# Patient Record
Sex: Male | Born: 1950 | Hispanic: Yes | Marital: Married | Smoking: Former smoker
Health system: Southern US, Community
[De-identification: ages and names within clinical notes are randomized; demographics above are authoritative.]

## PROBLEM LIST (undated history)

## (undated) DIAGNOSIS — I10 Essential (primary) hypertension: Secondary | ICD-10-CM

## (undated) DIAGNOSIS — C801 Malignant (primary) neoplasm, unspecified: Secondary | ICD-10-CM

## (undated) DIAGNOSIS — R7303 Prediabetes: Secondary | ICD-10-CM

## (undated) DIAGNOSIS — C8590 Non-Hodgkin lymphoma, unspecified, unspecified site: Secondary | ICD-10-CM

## (undated) DIAGNOSIS — N289 Disorder of kidney and ureter, unspecified: Secondary | ICD-10-CM

## (undated) HISTORY — PX: HAMMER TOE SURGERY: SHX385

## (undated) HISTORY — PX: KNEE SURGERY: SHX244

---

## 2016-05-19 ENCOUNTER — Emergency Department (HOSPITAL_BASED_OUTPATIENT_CLINIC_OR_DEPARTMENT_OTHER): Payer: Medicare HMO

## 2016-05-19 ENCOUNTER — Emergency Department (HOSPITAL_BASED_OUTPATIENT_CLINIC_OR_DEPARTMENT_OTHER)
Admission: EM | Admit: 2016-05-19 | Discharge: 2016-05-19 | Disposition: A | Discharge status: Home / Self Care | Payer: Medicare HMO | Attending: Emergency Medicine | Admitting: Emergency Medicine

## 2016-05-19 ENCOUNTER — Encounter (HOSPITAL_BASED_OUTPATIENT_CLINIC_OR_DEPARTMENT_OTHER): Payer: Self-pay | Admitting: *Deleted

## 2016-05-19 DIAGNOSIS — N189 Chronic kidney disease, unspecified: Secondary | ICD-10-CM | POA: Insufficient documentation

## 2016-05-19 DIAGNOSIS — I129 Hypertensive chronic kidney disease with stage 1 through stage 4 chronic kidney disease, or unspecified chronic kidney disease: Secondary | ICD-10-CM | POA: Insufficient documentation

## 2016-05-19 DIAGNOSIS — Z79899 Other long term (current) drug therapy: Secondary | ICD-10-CM | POA: Diagnosis not present

## 2016-05-19 DIAGNOSIS — R1084 Generalized abdominal pain: Secondary | ICD-10-CM

## 2016-05-19 DIAGNOSIS — R112 Nausea with vomiting, unspecified: Secondary | ICD-10-CM

## 2016-05-19 HISTORY — DX: Essential (primary) hypertension: I10

## 2016-05-19 HISTORY — DX: Disorder of kidney and ureter, unspecified: N28.9

## 2016-05-19 LAB — COMPREHENSIVE METABOLIC PANEL
ALK PHOS: 53 U/L (ref 38–126)
ALT: 20 U/L (ref 17–63)
AST: 23 U/L (ref 15–41)
Albumin: 4.4 g/dL (ref 3.5–5.0)
Anion gap: 11 (ref 5–15)
BUN: 24 mg/dL — AB (ref 6–20)
CALCIUM: 9.3 mg/dL (ref 8.9–10.3)
CHLORIDE: 101 mmol/L (ref 101–111)
CO2: 26 mmol/L (ref 22–32)
CREATININE: 1.7 mg/dL — AB (ref 0.61–1.24)
GFR, EST AFRICAN AMERICAN: 47 mL/min — AB (ref >60)
GFR, EST NON AFRICAN AMERICAN: 40 mL/min — AB (ref >60)
Glucose, Bld: 162 mg/dL — ABNORMAL HIGH (ref 65–99)
Potassium: 3.8 mmol/L (ref 3.5–5.1)
Sodium: 138 mmol/L (ref 135–145)
Total Bilirubin: 0.5 mg/dL (ref 0.3–1.2)
Total Protein: 7.5 g/dL (ref 6.5–8.1)

## 2016-05-19 LAB — CBC WITH DIFFERENTIAL/PLATELET
BASOS ABS: 0 10*3/uL (ref 0.0–0.1)
Basophils Relative: 0 %
Eosinophils Absolute: 0 10*3/uL (ref 0.0–0.7)
Eosinophils Relative: 0 %
HEMATOCRIT: 43.5 % (ref 39.0–52.0)
HEMOGLOBIN: 14.6 g/dL (ref 13.0–17.0)
LYMPHS ABS: 0.7 10*3/uL (ref 0.7–4.0)
LYMPHS PCT: 7 %
MCH: 32.6 pg (ref 26.0–34.0)
MCHC: 33.6 g/dL (ref 30.0–36.0)
MCV: 97.1 fL (ref 78.0–100.0)
Monocytes Absolute: 0.4 10*3/uL (ref 0.1–1.0)
Monocytes Relative: 4 %
NEUTROS ABS: 9 10*3/uL — AB (ref 1.7–7.7)
NEUTROS PCT: 89 %
PLATELETS: 214 10*3/uL (ref 150–400)
RBC: 4.48 MIL/uL (ref 4.22–5.81)
RDW: 13.4 % (ref 11.5–15.5)
WBC: 10 10*3/uL (ref 4.0–10.5)

## 2016-05-19 LAB — URINALYSIS, ROUTINE W REFLEX MICROSCOPIC
BILIRUBIN URINE: NEGATIVE
GLUCOSE, UA: NEGATIVE mg/dL
HGB URINE DIPSTICK: NEGATIVE
Ketones, ur: NEGATIVE mg/dL
Leukocytes, UA: NEGATIVE
Nitrite: NEGATIVE
PROTEIN: NEGATIVE mg/dL
SPECIFIC GRAVITY, URINE: 1.025 (ref 1.005–1.030)
pH: 7 (ref 5.0–8.0)

## 2016-05-19 LAB — LIPASE, BLOOD: LIPASE: 22 U/L (ref 11–51)

## 2016-05-19 MED ORDER — MORPHINE SULFATE (PF) 4 MG/ML IV SOLN
4.0000 mg | Freq: Once | INTRAVENOUS | Status: AC
  Administered 2016-05-19: 4 mg via INTRAVENOUS
  Filled 2016-05-19: qty 1

## 2016-05-19 MED ORDER — ONDANSETRON HCL 4 MG/2ML IJ SOLN
4.0000 mg | Freq: Once | INTRAMUSCULAR | Status: AC
  Administered 2016-05-19: 4 mg via INTRAVENOUS
  Filled 2016-05-19: qty 2

## 2016-05-19 MED ORDER — HYDROMORPHONE HCL 1 MG/ML IJ SOLN
0.5000 mg | Freq: Once | INTRAMUSCULAR | Status: AC
  Administered 2016-05-19: 0.5 mg via INTRAVENOUS
  Filled 2016-05-19: qty 1

## 2016-05-19 MED ORDER — SODIUM CHLORIDE 0.9 % IV BOLUS (SEPSIS)
1000.0000 mL | Freq: Once | INTRAVENOUS | Status: AC
  Administered 2016-05-19: 1000 mL via INTRAVENOUS

## 2016-05-19 MED ORDER — ONDANSETRON 4 MG PO TBDP: 4.0000 mg | ORAL_TABLET | Freq: Three times a day (TID) | ORAL | 0 refills | Status: DC | PRN

## 2016-05-19 MED ORDER — SODIUM CHLORIDE 0.9 % IV BOLUS (SEPSIS): 1000.0000 mL | Freq: Once | INTRAVENOUS | Status: DC

## 2016-05-19 MED ORDER — DICYCLOMINE HCL 20 MG PO TABS: 20.0000 mg | ORAL_TABLET | Freq: Three times a day (TID) | ORAL | 0 refills | Status: DC

## 2016-05-19 MED ORDER — SODIUM CHLORIDE 0.9 % IV SOLN: INTRAVENOUS | Status: DC

## 2016-05-19 MED ORDER — IOPAMIDOL (ISOVUE-300) INJECTION 61%
100.0000 mL | Freq: Once | INTRAVENOUS | Status: AC | PRN
  Administered 2016-05-19: 100 mL via INTRAVENOUS

## 2016-05-19 NOTE — ED Notes (Signed)
Tolerated po fluids with not further nausea/vomiting

## 2016-05-19 NOTE — ED Notes (Signed)
MD at bedside. 

## 2016-05-19 NOTE — ED Notes (Signed)
Pt ate dinner and then began having upper abd pain with n/v. Vomited x 10 PTA.

## 2016-05-19 NOTE — ED Notes (Signed)
Pt states little change after morphine given. Rates pain 8-10. MD aware and orders received.

## 2016-05-19 NOTE — ED Provider Notes (Addendum)
TIME SEEN: 2:25 AM  CHIEF COMPLAINT: Abdominal pain, nausea, vomiting  HPI: Pt is a 65 y.o. male with history of chronic kidney disease, no abdominal surgical history who presents emergency department with diffuse cramping, sharp abdominal pain that he describes as severe that started tonight without radiation and nausea and vomiting. Vomiting has been nonbloody, nonbilious and he has had over 10 episodes. No diarrhea, bloody stools or melena. Having normal bowel movements. No fevers or chills. No recent international travel. States his family has been eating similar foods to him and no one else is sick. His nephew did have diarrhea several days ago but no vomiting. Denies dysuria or hematuria. No radiation of pain. No aggravating or alleviating factors. No chest pain or shortness of breath. States he ate [place], peas and rice for dinner. Has never been told he has problems with his gallbladder. No family history of gallbladder disease. No alcohol use.  ROS: See HPI Constitutional: no fever  Eyes: no drainage  ENT: no runny nose   Cardiovascular:  no chest pain  Resp: no SOB  GI:  vomiting GU: no dysuria Integumentary: no rash  Allergy: no hives  Musculoskeletal: no leg swelling  Neurological: no slurred speech ROS otherwise negative  PAST MEDICAL HISTORY/PAST SURGICAL HISTORY:  No past medical history on file.  MEDICATIONS:  Prior to Admission medications   Not on File    ALLERGIES:  Allergies not on file  SOCIAL HISTORY:  Social History  Substance Use Topics  . Smoking status: Not on file  . Smokeless tobacco: Not on file  . Alcohol use Not on file    FAMILY HISTORY: No family history on file.  EXAM: BP 139/83   Pulse 60   Temp 97.8 F (36.6 C)   Resp 18   Ht 5' 11.5" (1.816 m)   Wt 278 lb (126.1 kg)   SpO2 100%   BMI 38.23 kg/m  CONSTITUTIONAL: Alert and oriented and responds appropriately to questions. Appears uncomfortable, afebrile, nontoxic appearing HEAD:  Normocephalic EYES: Conjunctivae clear, PERRL, EOMI ENT: normal nose; no rhinorrhea; dry mucous membranes NECK: Supple, no meningismus, no nuchal rigidity, no LAD  CARD: RRR; S1 and S2 appreciated; no murmurs, no clicks, no rubs, no gallops RESP: Normal chest excursion without splinting or tachypnea; breath sounds clear and equal bilaterally; no wheezes, no rhonchi, no rales, no hypoxia or respiratory distress, speaking full sentences ABD/GI: Normal bowel sounds; non-distended; soft, diffusely tender throughout the mid and upper abdomen, no rebound, no guarding, no peritoneal signs, no hepatosplenomegaly, no tenderness at McBurney's point, negative Murphy sign BACK:  The back appears normal and is non-tender to palpation, there is no CVA tenderness EXT: Normal ROM in all joints; non-tender to palpation; no edema; normal capillary refill; no cyanosis, no calf tenderness or swelling    SKIN: Normal color for age and race; warm; no rash NEURO: Moves all extremities equally, sensation to light touch intact diffusely, cranial nerves II through XII intact, normal speech PSYCH: The patient's mood and manner are appropriate. Grooming and personal hygiene are appropriate.  MEDICAL DECISION MAKING: Patient here with abdominal pain, nausea vomiting. Differential diagnosis includes viral illness, gastritis, pancreatitis, colitis, less likely cholecystitis or choledocholithiasis. Doubt appendicitis. We'll obtain labs, urine and a CT of his abdomen and pelvis. We'll treat his symptoms with IV fluids, morphine, Zofran and reassess.  ED PROGRESS: 4:30 AM  Pt reports feeling much better. Able to drink without difficulty or further vomiting. Labs have been unremarkable. No leukocytosis.  Does have a creatinine of 1.7 which he reports is baseline. LFTs, lipase normal. Urine shows no blood or sign of infection. CT scan shows no acute abnormality. Normal-appearing intestines, gallbladder, pancreas, appendix. Suspect viral  illness. Will discharge home with prescriptions for Bentyl, Zofran. He does have a PCP for outpatient follow-up. Discussed return precautions. He verbalizes understanding and is comfortable with this plan.   At this time, I do not feel there is any life-threatening condition present. I have reviewed and discussed all results (EKG, imaging, lab, urine as appropriate) and exam findings with patient/family. I have reviewed nursing notes and appropriate previous records.  I feel the patient is safe to be discharged home without further emergent workup and can continue workup as an outpatient as needed. Discussed usual and customary return precautions. Patient/family verbalize understanding and are comfortable with this plan.  Outpatient follow-up has been provided. All questions have been answered.      [place], DO 05/19/16 [place], DO 05/19/16 [phone]

## 2016-05-19 NOTE — ED Notes (Signed)
Radiology in to give pt contrast and instructions.

## 2016-05-19 NOTE — ED Triage Notes (Addendum)
Pt c/o upper abd pain that started after eating this evening. Pt c/o sharp abd pain that is constant. States he has vomited approx 10 times denies any fevers. Family states they ate the same thing as pt and are ok.

## 2016-05-19 NOTE — ED Notes (Signed)
To CT

## 2016-05-19 NOTE — ED Notes (Signed)
Returned from CT.

## 2018-01-09 ENCOUNTER — Other Ambulatory Visit (HOSPITAL_COMMUNITY): Payer: Self-pay | Admitting: Otolaryngology

## 2018-01-09 DIAGNOSIS — R221 Localized swelling, mass and lump, neck: Secondary | ICD-10-CM

## 2018-01-15 ENCOUNTER — Encounter (HOSPITAL_COMMUNITY): Payer: Self-pay

## 2018-01-15 ENCOUNTER — Ambulatory Visit (HOSPITAL_COMMUNITY)
Admission: RE | Admit: 2018-01-15 | Discharge: 2018-01-15 | Disposition: A | Discharge status: Home / Self Care | Payer: Medicare HMO | Source: Ambulatory Visit | Attending: Otolaryngology | Admitting: Otolaryngology

## 2018-01-15 ENCOUNTER — Other Ambulatory Visit (HOSPITAL_COMMUNITY): Payer: Self-pay | Admitting: Otolaryngology

## 2018-01-15 DIAGNOSIS — R221 Localized swelling, mass and lump, neck: Secondary | ICD-10-CM | POA: Diagnosis present

## 2018-01-15 LAB — POCT I-STAT CREATININE: Creatinine, Ser: 1.5 mg/dL — ABNORMAL HIGH (ref 0.61–1.24)

## 2018-01-15 MED ORDER — IOHEXOL 300 MG/ML  SOLN
75.0000 mL | Freq: Once | INTRAMUSCULAR | Status: AC | PRN
  Administered 2018-01-15: 75 mL via INTRAVENOUS

## 2018-01-19 ENCOUNTER — Other Ambulatory Visit (HOSPITAL_COMMUNITY): Payer: Self-pay | Admitting: Otolaryngology

## 2018-01-19 DIAGNOSIS — R220 Localized swelling, mass and lump, head: Secondary | ICD-10-CM

## 2018-01-19 DIAGNOSIS — R221 Localized swelling, mass and lump, neck: Secondary | ICD-10-CM

## 2018-01-26 ENCOUNTER — Other Ambulatory Visit: Payer: Self-pay | Admitting: Radiology

## 2018-01-27 ENCOUNTER — Other Ambulatory Visit: Payer: Self-pay | Admitting: Radiology

## 2018-01-28 ENCOUNTER — Encounter (HOSPITAL_COMMUNITY): Payer: Self-pay

## 2018-01-28 ENCOUNTER — Ambulatory Visit (HOSPITAL_COMMUNITY)
Admission: RE | Admit: 2018-01-28 | Discharge: 2018-01-28 | Disposition: A | Discharge status: Home / Self Care | Payer: Medicare HMO | Source: Ambulatory Visit | Attending: Otolaryngology | Admitting: Otolaryngology

## 2018-01-28 DIAGNOSIS — I10 Essential (primary) hypertension: Secondary | ICD-10-CM | POA: Insufficient documentation

## 2018-01-28 DIAGNOSIS — Z79899 Other long term (current) drug therapy: Secondary | ICD-10-CM | POA: Diagnosis not present

## 2018-01-28 DIAGNOSIS — Z87891 Personal history of nicotine dependence: Secondary | ICD-10-CM | POA: Diagnosis not present

## 2018-01-28 DIAGNOSIS — R221 Localized swelling, mass and lump, neck: Secondary | ICD-10-CM

## 2018-01-28 DIAGNOSIS — D490 Neoplasm of unspecified behavior of digestive system: Secondary | ICD-10-CM | POA: Diagnosis not present

## 2018-01-28 DIAGNOSIS — R220 Localized swelling, mass and lump, head: Secondary | ICD-10-CM

## 2018-01-28 LAB — CBC
HCT: 46.7 % (ref 39.0–52.0)
HEMOGLOBIN: 15.6 g/dL (ref 13.0–17.0)
MCH: 32.7 pg (ref 26.0–34.0)
MCHC: 33.4 g/dL (ref 30.0–36.0)
MCV: 97.9 fL (ref 78.0–100.0)
Platelets: 190 10*3/uL (ref 150–400)
RBC: 4.77 MIL/uL (ref 4.22–5.81)
RDW: 12.9 % (ref 11.5–15.5)
WBC: 6.6 10*3/uL (ref 4.0–10.5)

## 2018-01-28 LAB — PROTIME-INR
INR: 1.05
Prothrombin Time: 13.6 seconds (ref 11.4–15.2)

## 2018-01-28 MED ORDER — MIDAZOLAM HCL 2 MG/2ML IJ SOLN
INTRAMUSCULAR | Status: AC
  Filled 2018-01-28: qty 2

## 2018-01-28 MED ORDER — HYDROCODONE-ACETAMINOPHEN 5-325 MG PO TABS: 1.0000 | ORAL_TABLET | ORAL | Status: DC | PRN

## 2018-01-28 MED ORDER — LIDOCAINE HCL (PF) 1 % IJ SOLN
INTRAMUSCULAR | Status: AC
  Filled 2018-01-28: qty 30

## 2018-01-28 MED ORDER — MIDAZOLAM HCL 2 MG/2ML IJ SOLN
INTRAMUSCULAR | Status: AC | PRN
  Administered 2018-01-28: 1 mg via INTRAVENOUS

## 2018-01-28 MED ORDER — FENTANYL CITRATE (PF) 100 MCG/2ML IJ SOLN
INTRAMUSCULAR | Status: AC
  Filled 2018-01-28: qty 2

## 2018-01-28 MED ORDER — FENTANYL CITRATE (PF) 100 MCG/2ML IJ SOLN
INTRAMUSCULAR | Status: AC | PRN
  Administered 2018-01-28: 25 ug via INTRAVENOUS

## 2018-01-28 MED ORDER — SODIUM CHLORIDE 0.9 % IV SOLN: INTRAVENOUS | Status: DC

## 2018-01-28 NOTE — H&P (Signed)
Chief Complaint: Patient was seen in consultation today for left submandibular node/mass biopsy at the request of Crossley,James J  Referring Physician(s): Crossley,James J  Supervising Physician: Markus Daft  Patient Status: [facility] - Out-pt  History of Present Illness: Jeffery Wilson is a 67 y.o. male   Pt has noticed this"knot" that "comes and goes" for 2 yrs Never completely goes away Denies pain Quit smoking 5 yrs ago Denies chewing tobacco PCP imaged for first time 01/15/18 CT IMPRESSION: 1. 3.3 x 4.1 x 5 cm solid LEFT submandibular gland mass. Differential diagnosis includes benign mixed tumor or carcinoma. 2. Prominent though not pathologically enlarged LEFT lymph nodes potentially metastatic if tumor is malignant. 3. For above findings, recommend ENT consultation and histopathologic correlation.  Now scheduled for biopsy  Past Medical History:  Diagnosis Date  . Hypertension   . Renal disorder    pt says stage 3 kidney dz     Past Surgical History:  Procedure Laterality Date  . HAMMER TOE SURGERY    . KNEE SURGERY      Allergies: Patient has no known allergies.  Medications: Prior to Admission medications   Medication Sig Start Date End Date Taking? Authorizing Provider  acetaminophen (TYLENOL) 500 MG tablet Take 1,000 mg by mouth every 6 (six) hours as needed for moderate pain or headache.   Yes [provider]  Artificial Tear Solution (GENTEAL TEARS OP) Place 1 drop into both eyes at bedtime.   Yes [provider]  [employer] Extract [phone] PSORIASIS MEDICATED EX) Apply 1 application topically daily.   Yes [provider]  furosemide (LASIX) 40 MG tablet Take 40 mg by mouth daily.    Yes [provider]  metoprolol succinate (TOPROL-XL) 50 MG 24 hr tablet Take 50 mg by mouth daily. Take with or immediately following a meal.   Yes [provider]  Multiple Vitamins-Minerals (MULTIVITAMIN PO) Take 1 tablet by  mouth daily.   Yes [provider]  Omega-3 Fatty Acids (OMEGA 3 PO) Take 1 capsule by mouth 2 (two) times daily.   Yes [provider]  pravastatin (PRAVACHOL) 20 MG tablet Take 20 mg by mouth at bedtime.    Yes [provider]  Turmeric 500 MG CAPS Take 500 mg by mouth daily.   Yes [provider]  dicyclomine (BENTYL) 20 MG tablet Take 1 tablet (20 mg total) by mouth 3 (three) times daily before meals. As needed for abdominal cramping Patient not taking: Reported on 01/20/2018 05/19/16   Ward, Delice Bison, DO  ondansetron (ZOFRAN ODT) 4 MG disintegrating tablet Take 1 tablet (4 mg total) by mouth every 8 (eight) hours as needed for nausea or vomiting. Patient not taking: Reported on 01/20/2018 05/19/16   Ward, Delice Bison, DO     History reviewed. No pertinent family history.  Social History   Socioeconomic History  . Marital status: Married    Spouse name: Not on file  . Number of children: Not on file  . Years of education: Not on file  . Highest education level: Not on file  Occupational History  . Not on file  Social Needs  . Financial resource strain: Not on file  . Food insecurity:    Worry: Not on file    Inability: Not on file  . Transportation needs:    Medical: Not on file    Non-medical: Not on file  Tobacco Use  . Smoking status: Never Smoker  . Smokeless tobacco: Never  Used  Substance and Sexual Activity  . Alcohol use: Yes    Comment: daily   . Drug use: No  . Sexual activity: Not on file  Lifestyle  . Physical activity:    Days per week: Not on file    Minutes per session: Not on file  . Stress: Not on file  Relationships  . Social connections:    Talks on phone: Not on file    Gets together: Not on file    Attends religious service: Not on file    Active member of club or organization: Not on file    Attends meetings of clubs or organizations: Not on file    Relationship status: Not on file  Other Topics Concern  .  Not on file  Social History Narrative  . Not on file    Review of Systems: A 12 point ROS discussed and pertinent positives are indicated in the HPI above.  All other systems are negative.  Review of Systems  Constitutional: Negative for activity change, fatigue and fever.  HENT: Negative for sore throat and trouble swallowing.   Respiratory: Negative for cough.   Cardiovascular: Negative for chest pain.  Gastrointestinal: Negative for abdominal pain.  Musculoskeletal: Negative for back pain.  Neurological: Negative for weakness.  Psychiatric/Behavioral: Negative for behavioral problems and confusion.    Vital Signs: BP (!) 155/87   Pulse (!) 54   Temp 97.7 F (36.5 C)   Ht 5\' 10"  (1.778 m)   Wt 268 lb 6.4 oz (121.7 kg)   SpO2 100%   BMI 38.51 kg/m   Physical Exam  Constitutional: He is oriented to person, place, and time.  Neck:  Palpable 3 cm NT node  Left submandibular area Hard to touch; no fluctuance   Cardiovascular: Normal rate, regular rhythm and normal heart sounds.  Pulmonary/Chest: Effort normal and breath sounds normal.  Abdominal: Soft. Bowel sounds are normal.  Musculoskeletal: Normal range of motion.  Neurological: He is alert and oriented to person, place, and time.  Skin: Skin is warm and dry.  Psychiatric: He has a normal mood and affect. His behavior is normal. Judgment and thought content normal.  Nursing note and vitals reviewed.   Imaging: Ct Soft Tissue Neck W Contrast  Result Date: 01/15/2018 CLINICAL DATA:  Waxing and waning LEFT submandibular mass for 2 years. EXAM: CT NECK WITH CONTRAST TECHNIQUE: Multidetector CT imaging of the neck was performed using the standard protocol following the bolus administration of intravenous contrast. CONTRAST:  31mL OMNIPAQUE IOHEXOL 300 MG/ML  SOLN COMPARISON:  None. FINDINGS: PHARYNX AND LARYNX: Normal.  Widely patent airway. SALIVARY GLANDS: 3.3 x 4.1 x 5 cm homogeneous solid mass contiguous with and  appearing to arise from the lateral aspect LEFT submandibular gland. No sialolith or inflammatory changes. Remaining major salivary glands normal. THYROID: Normal. LYMPH NODES: Prominent though not pathologically enlarged reniform LEFT Level 3 and 4 lymph nodes. VASCULAR: Mild calcific atherosclerosis LEFT carotid bifurcation. Focally enlarged LEFT supraclavicular venous structure. LIMITED INTRACRANIAL: Normal. VISUALIZED ORBITS: Normal. MASTOIDS AND VISUALIZED PARANASAL SINUSES: Small LEFT maxillary sinus mucosal retention cysts without air-fluid levels. RIGHT posterior ethmoid air-fluid level. Mastoid air cells are well aerated. SKELETON: LEFT submandibular mass is indistinguishable from the LEFT inferior masseter muscle. RIGHT tooth 20 RIGHT maxillary molar periapical abscess. Bulky ventral endplate spurring Z0-0. UPPER CHEST: Lung apices are clear. No superior mediastinal lymphadenopathy. OTHER: None. IMPRESSION: 1. 3.3 x 4.1 x 5 cm solid LEFT submandibular gland mass. Differential diagnosis includes  benign mixed tumor or carcinoma. 2. Prominent though not pathologically enlarged LEFT lymph nodes potentially metastatic if tumor is malignant. 3. For above findings, recommend ENT consultation and histopathologic correlation. Electronically Signed   By: Elon Alas M.D.   On: 01/15/2018 21:52    Labs:  CBC: No results for input(s): WBC, HGB, HCT, PLT in the last 8760 hours.  COAGS: No results for input(s): INR, APTT in the last 8760 hours.  BMP: Recent Labs    01/15/18 1708  CREATININE 1.50*    LIVER FUNCTION TESTS: No results for input(s): BILITOT, AST, ALT, ALKPHOS, PROT, ALBUMIN in the last 8760 hours.  TUMOR MARKERS: No results for input(s): AFPTM, CEA, CA199, CHROMGRNA in the last 8760 hours.  Assessment and Plan:  Left submandibular node Enlarges then subsides x 2 yrs CT reveals mass Now scheduled for biopsy of same Risks and benefits discussed with the patient including,  but not limited to bleeding, infection, damage to adjacent structures or low yield requiring additional tests.  All of the patient's questions were answered, patient is agreeable to proceed. Consent signed and in chart.   Thank you for this interesting consult.  I greatly enjoyed meeting Jeffery Wilson and look forward to participating in their care.  A copy of this report was sent to the requesting provider on this date.  Electronically Signed: Lavonia Drafts, PA-C 01/28/2018, 11:54 AM   I spent a total of  30 Minutes   in face to face in clinical consultation, greater than 50% of which was counseling/coordinating care for left submandibular LN bx

## 2018-01-28 NOTE — Sedation Documentation (Signed)
Pt is in radiology nurses station at this time. Accompanied by wife and daughters. Pt has not complaints of pain or discomfort. Awaiting MD.

## 2018-01-28 NOTE — Discharge Instructions (Signed)

## 2018-01-28 NOTE — Procedures (Signed)
US guided core biopsies from left submandibular mass.  5 cores obtained.  Minimal blood loss and no immediate complication.

## 2018-03-17 ENCOUNTER — Encounter: Payer: Self-pay | Admitting: Hematology

## 2018-03-17 ENCOUNTER — Telehealth: Payer: Self-pay | Admitting: Hematology

## 2018-03-17 NOTE — Telephone Encounter (Signed)
New referral received from Dr. Reine Just office from [facility] for Jeffery Wilson. Received a call from [place] from Dr. Waynard Edwards office to schedule an appt. Pt has been scheduled to see Dr. [name] on 10/1 at 11am. Ander Purpura will notify the pt. Letter mailed.

## 2018-03-23 NOTE — Progress Notes (Signed)
HEMATOLOGY/ONCOLOGY CONSULTATION NOTE  Date of Service: 03/24/2018  Patient Care Team: Helane Rima, MD as PCP - General (Family Medicine)  CHIEF COMPLAINTS/PURPOSE OF CONSULTATION:  Concern for a Lymphoproliferative process  HISTORY OF PRESENTING ILLNESS:   Jeffery Wilson is a wonderful 67 y.o. male who has been referred to [place] by Dr. Helane Rima for evaluation and management of his concern for a Lymphoproliferative process. He is accompanied today by his daughter and wife. The pt reports that he is doing well overall.   The pt reports that he first noticed the left jaw mass two years ago which has slowly grown. 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 The pt notes that his PCP Dr. Lavone Neri noticed the mass in July/August at a wellness visit and he was subsequently referred to ENT Dr. Fenton Malling at [facility]. The pt denies pain, discomfort, fevers, chills, night sweats, unexpected weight loss, and any other lumps or bumps. He notes that he is not feeling any differently recently as compared to the last 6 months to a year.   The pt also notes that his right lower leg is also larger than his left, and has been this way all of his life. He notes that his ankle swelling is stable and has varicose veins.   The pt notes hammer toe surgery and arthroscopic left knee surgery. He notes that he was previously diagnosed with CKD but has managed this well and notes his creatinine has nearly normalized. He also notes that has hypertension.   The pt notes that he had Prevnar and Pneumovax last year and regularly receives his annual flu vaccine.   Of note prior to the patient's visit today, pt has had a Submandibular gland biopsy completed on 01/28/18 with results revealing concern for a lymphoproliferative process.   The pt also had a 01/15/18 CT Neck which revealed 3.3  x 4.1 x 5 cm solid LEFT submandibular gland mass. Differential diagnosis includes benign mixed tumor or carcinoma. 2. Prominent though not pathologically enlarged LEFT lymph nodes potentially metastatic if tumor is malignant. 3. For above findings, recommend ENT consultation and histopathologic correlation.  Most recent lab results (01/28/18) of CBC is as follows: all values are WNL.  On review of systems, pt reports left jaw mass, stable energy levels, moving his bowels well, stable ankle swelling, and denies fevers, chills, night sweats, painful jaw mass, discomfort at jaw mass, unexpected weight loss, problems swallowing, pain along the spine, itching, back pain, new fatigue, abdominal pains, problems passing urine, and any other symptoms.   On PMHx the pt reports CKD, HTN, left knee arthroscopic knee surgery in 2016. 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 The pt smokes one cigarette every morning. He drinks 2-3 drinks of whiskey most afternoons, 4-6 on the weekends.  On Family Hx the pt reports father with MI at age 79. Denies blood disorders or cancer.    MEDICAL HISTORY:  Past Medical History:  Diagnosis Date  . Hypertension   . Renal disorder    pt says stage 3 kidney dz     SURGICAL HISTORY: Past Surgical History:  Procedure Laterality Date  . HAMMER TOE SURGERY    . KNEE SURGERY      SOCIAL HISTORY: Social History   Socioeconomic History  . Marital  status: Married    Spouse name: Not on file  . Number of children: Not on file  . Years of education: Not on file  . Highest education level: Not on file  Occupational History  . Not on file  Social Needs  . Financial resource strain: Not on file  . Food insecurity:    Worry: Not on file    Inability: Not on file  . Transportation needs:    Medical: Not on file    Non-medical: Not on file  Tobacco Use  . Smoking status: Never Smoker  . Smokeless tobacco: Never Used  Substance  and Sexual Activity  . Alcohol use: Yes    Comment: daily   . Drug use: No  . Sexual activity: Not on file  Lifestyle  . Physical activity:    Days per week: Not on file    Minutes per session: Not on file  . Stress: Not on file  Relationships  . Social connections:    Talks on phone: Not on file    Gets together: Not on file    Attends religious service: Not on file    Active member of club or organization: Not on file    Attends meetings of clubs or organizations: Not on file    Relationship status: Not on file  . Intimate partner violence:    Fear of current or ex partner: Not on file    Emotionally abused: Not on file    Physically abused: Not on file    Forced sexual activity: Not on file  Other Topics Concern  . Not on file  Social History Narrative  . Not on file    FAMILY HISTORY: History reviewed. No pertinent family history.  ALLERGIES:  has No Known Allergies.  MEDICATIONS:  Current Outpatient Medications  Medication Sig Dispense Refill  . acetaminophen (TYLENOL) 500 MG tablet Take 1,000 mg by mouth every 6 (six) hours as needed for moderate pain or headache.    . Artificial Tear Solution (GENTEAL TEARS OP) Place 1 drop into both eyes at bedtime.    Beryl Meager Tar Extract [phone] PSORIASIS MEDICATED EX) Apply 1 application topically daily.    Marland Kitchen dicyclomine (BENTYL) 20 MG tablet Take 1 tablet (20 mg total) by mouth 3 (three) times daily before meals. As needed for abdominal cramping (Patient not taking: Reported on 01/20/2018) 20 tablet 0  . furosemide (LASIX) 40 MG tablet Take 40 mg by mouth daily.     . metoprolol succinate (TOPROL-XL) 50 MG 24 hr tablet Take 50 mg by mouth daily. Take with or immediately following a meal.    . Multiple Vitamins-Minerals (MULTIVITAMIN PO) Take 1 tablet by mouth daily.    . Omega-3 Fatty Acids (OMEGA 3 PO) Take 1 capsule by mouth 2 (two) times daily.    . ondansetron (ZOFRAN ODT) 4 MG disintegrating tablet Take 1 tablet (4 mg total)  by mouth every 8 (eight) hours as needed for nausea or vomiting. (Patient not taking: Reported on 01/20/2018) 20 tablet 0  . pravastatin (PRAVACHOL) 20 MG tablet Take 20 mg by mouth at bedtime.     . Turmeric 500 MG CAPS Take 500 mg by mouth daily.     No current facility-administered medications for this visit.     REVIEW OF SYSTEMS:    10 Point review of Systems was done is negative except as noted above.  PHYSICAL EXAMINATION: ECOG PERFORMANCE STATUS: 1 - Symptomatic but completely ambulatory  . Vitals:  03/24/18 1130  BP: (!) 148/81  Pulse: (!) 55  Resp: 18  Temp: 97.9 F (36.6 C)  SpO2: 100%   Filed Weights   03/24/18 1130  Weight: 272 lb 3.2 oz (123.5 kg)   .Body mass index is 39.06 kg/m.  GENERAL:alert, in no acute distress and comfortable SKIN: no acute rashes, no significant lesions, some venous stasis skin changes on lower right extremity  EYES: conjunctiva are pink and non-injected, sclera anicteric OROPHARYNX: MMM, no exudates, no oropharyngeal erythema or ulceration NECK: supple, no JVD, palpable movable mass at left submandibular gland  LYMPH:  no palpable lymphadenopathy in the cervical, axillary or inguinal regions LUNGS: clear to auscultation b/l with normal respiratory effort HEART: regular rate & rhythm ABDOMEN:  normoactive bowel sounds , non tender, not distended. Extremity: 1+ pedal edema, venous stasis changes in right lower extremity  PSYCH: alert & oriented x 3 with fluent speech NEURO: no focal motor/sensory deficits  LABORATORY DATA:  I have reviewed the data as listed  . CBC Latest Ref Rng & Units 03/24/2018 01/28/2018 05/19/2016  WBC 4.0 - 10.3 K/uL 6.1 6.6 10.0  Hemoglobin 13.0 - 17.1 g/dL 15.1 15.6 14.6  Hematocrit 38.4 - 49.9 % 44.7 46.7 43.5  Platelets 140 - 400 K/uL 203 190 214    . CMP Latest Ref Rng & Units 03/24/2018 01/15/2018 05/19/2016  Glucose 70 - 99 mg/dL 99 - 162(H)  BUN 8 - 23 mg/dL 16 - 24(H)  Creatinine 0.61 - 1.24  mg/dL 1.43(H) 1.50(H) 1.70(H)  Sodium 135 - 145 mmol/L 141 - 138  Potassium 3.5 - 5.1 mmol/L 4.0 - 3.8  Chloride 98 - 111 mmol/L 103 - 101  CO2 22 - 32 mmol/L 29 - 26  Calcium 8.9 - 10.3 mg/dL 10.0 - 9.3  Total Protein 6.5 - 8.1 g/dL 7.9 - 7.5  Total Bilirubin 0.3 - 1.2 mg/dL 0.7 - 0.5  Alkaline Phos 38 - 126 U/L 55 - 53  AST 15 - 41 U/L 24 - 23  ALT 0 - 44 U/L 20 - 20   Component     Latest Ref Rng & Units 03/24/2018  Hepatitis B Surface Ag     Negative Negative  Hep B Core Ab, Tot     Negative Negative  HIV Screen 4th Generation wRfx     Non Reactive Non Reactive  HCV Ab     0.0 - 0.9 s/co ratio <0.1  LDH     98 - 192 U/L 139   . Lab Results  Component Value Date   LDH 139 03/24/2018    01/28/18 Submandibular gland biopsy:    RADIOGRAPHIC STUDIES: I have personally reviewed the radiological images as listed and agreed with the findings in the report. No results found.  ASSESSMENT & PLAN:   67 y.o. male with  1. 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  -Discussed the need for a larger tissue sample for more accurate diagnosis and that I will refer the pt to ENT Dr. 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 The patient knows to call the clinic with any problems, questions or concerns.  The total time spent in the appt was 45 minutes and more than 50% was on counseling and direct patient cares.     Sullivan Lone MD MS AAHIVMS [facility] Hematology/Oncology Physician [facility]  (Office):       [phone] (Work cell):  [phone] (Fax):           [phone]  03/24/2018 12:25 PM  I, Baldwin [place], am acting as a scribe for Dr. [name]  .I have reviewed the above documentation for accuracy and completeness, and I agree with the above. Brunetta Genera MD

## 2018-03-24 ENCOUNTER — Inpatient Hospital Stay: Payer: Medicare HMO | Attending: Hematology | Admitting: Hematology

## 2018-03-24 ENCOUNTER — Inpatient Hospital Stay: Payer: Medicare HMO

## 2018-03-24 ENCOUNTER — Encounter: Payer: Self-pay | Admitting: Hematology

## 2018-03-24 VITALS — BP 148/81 | HR 55 | Temp 97.9°F | Resp 18 | Ht 70.0 in | Wt 272.2 lb

## 2018-03-24 DIAGNOSIS — I872 Venous insufficiency (chronic) (peripheral): Secondary | ICD-10-CM

## 2018-03-24 DIAGNOSIS — N189 Chronic kidney disease, unspecified: Secondary | ICD-10-CM | POA: Insufficient documentation

## 2018-03-24 DIAGNOSIS — R221 Localized swelling, mass and lump, neck: Secondary | ICD-10-CM | POA: Insufficient documentation

## 2018-03-24 DIAGNOSIS — K8020 Calculus of gallbladder without cholecystitis without obstruction: Secondary | ICD-10-CM | POA: Insufficient documentation

## 2018-03-24 DIAGNOSIS — I8391 Asymptomatic varicose veins of right lower extremity: Secondary | ICD-10-CM | POA: Insufficient documentation

## 2018-03-24 DIAGNOSIS — K760 Fatty (change of) liver, not elsewhere classified: Secondary | ICD-10-CM | POA: Insufficient documentation

## 2018-03-24 DIAGNOSIS — C8511 Unspecified B-cell lymphoma, lymph nodes of head, face, and neck: Secondary | ICD-10-CM | POA: Diagnosis not present

## 2018-03-24 DIAGNOSIS — M7989 Other specified soft tissue disorders: Secondary | ICD-10-CM | POA: Diagnosis not present

## 2018-03-24 DIAGNOSIS — Z79899 Other long term (current) drug therapy: Secondary | ICD-10-CM | POA: Insufficient documentation

## 2018-03-24 DIAGNOSIS — F17210 Nicotine dependence, cigarettes, uncomplicated: Secondary | ICD-10-CM | POA: Diagnosis not present

## 2018-03-24 DIAGNOSIS — M129 Arthropathy, unspecified: Secondary | ICD-10-CM

## 2018-03-24 DIAGNOSIS — K5730 Diverticulosis of large intestine without perforation or abscess without bleeding: Secondary | ICD-10-CM | POA: Insufficient documentation

## 2018-03-24 DIAGNOSIS — I129 Hypertensive chronic kidney disease with stage 1 through stage 4 chronic kidney disease, or unspecified chronic kidney disease: Secondary | ICD-10-CM | POA: Insufficient documentation

## 2018-03-24 LAB — CMP (CANCER CENTER ONLY)
ALT: 20 U/L (ref 0–44)
AST: 24 U/L (ref 15–41)
Albumin: 4.4 g/dL (ref 3.5–5.0)
Alkaline Phosphatase: 55 U/L (ref 38–126)
Anion gap: 9 (ref 5–15)
BILIRUBIN TOTAL: 0.7 mg/dL (ref 0.3–1.2)
BUN: 16 mg/dL (ref 8–23)
CO2: 29 mmol/L (ref 22–32)
Calcium: 10 mg/dL (ref 8.9–10.3)
Chloride: 103 mmol/L (ref 98–111)
Creatinine: 1.43 mg/dL — ABNORMAL HIGH (ref 0.61–1.24)
GFR, EST AFRICAN AMERICAN: 57 mL/min — AB (ref >60)
GFR, EST NON AFRICAN AMERICAN: 49 mL/min — AB (ref >60)
Glucose, Bld: 99 mg/dL (ref 70–99)
Potassium: 4 mmol/L (ref 3.5–5.1)
SODIUM: 141 mmol/L (ref 135–145)
TOTAL PROTEIN: 7.9 g/dL (ref 6.5–8.1)

## 2018-03-24 LAB — LACTATE DEHYDROGENASE: LDH: 139 U/L (ref 98–192)

## 2018-03-24 LAB — CBC WITH DIFFERENTIAL/PLATELET
BASOS ABS: 0 10*3/uL (ref 0.0–0.1)
BASOS PCT: 1 %
EOS ABS: 0.1 10*3/uL (ref 0.0–0.5)
Eosinophils Relative: 2 %
HEMATOCRIT: 44.7 % (ref 38.4–49.9)
HEMOGLOBIN: 15.1 g/dL (ref 13.0–17.1)
Lymphocytes Relative: 25 %
Lymphs Abs: 1.5 10*3/uL (ref 0.9–3.3)
MCH: 33.1 pg (ref 27.2–33.4)
MCHC: 33.7 g/dL (ref 32.0–36.0)
MCV: 98.3 fL — ABNORMAL HIGH (ref 79.3–98.0)
Monocytes Absolute: 0.6 10*3/uL (ref 0.1–0.9)
Monocytes Relative: 9 %
NEUTROS ABS: 3.9 10*3/uL (ref 1.5–6.5)
NEUTROS PCT: 63 %
Platelets: 203 10*3/uL (ref 140–400)
RBC: 4.55 MIL/uL (ref 4.20–5.82)
RDW: 13.9 % (ref 11.0–14.6)
WBC: 6.1 10*3/uL (ref 4.0–10.3)

## 2018-03-25 LAB — HEPATITIS B SURFACE ANTIGEN: HEP B S AG: NEGATIVE

## 2018-03-25 LAB — HEPATITIS B CORE ANTIBODY, TOTAL: HEP B C TOTAL AB: NEGATIVE

## 2018-03-25 LAB — HEPATITIS C ANTIBODY

## 2018-03-25 LAB — HIV ANTIBODY (ROUTINE TESTING W REFLEX): HIV Screen 4th Generation wRfx: NONREACTIVE

## 2018-04-02 ENCOUNTER — Ambulatory Visit (HOSPITAL_COMMUNITY)
Admission: RE | Admit: 2018-04-02 | Discharge: 2018-04-02 | Disposition: A | Discharge status: Home / Self Care | Payer: Medicare HMO | Source: Ambulatory Visit | Attending: Hematology | Admitting: Hematology

## 2018-04-02 DIAGNOSIS — K5730 Diverticulosis of large intestine without perforation or abscess without bleeding: Secondary | ICD-10-CM | POA: Diagnosis not present

## 2018-04-02 DIAGNOSIS — K8020 Calculus of gallbladder without cholecystitis without obstruction: Secondary | ICD-10-CM | POA: Insufficient documentation

## 2018-04-02 DIAGNOSIS — K760 Fatty (change of) liver, not elsewhere classified: Secondary | ICD-10-CM | POA: Insufficient documentation

## 2018-04-02 DIAGNOSIS — C8511 Unspecified B-cell lymphoma, lymph nodes of head, face, and neck: Secondary | ICD-10-CM | POA: Diagnosis not present

## 2018-04-02 LAB — GLUCOSE, CAPILLARY: Glucose-Capillary: 113 mg/dL — ABNORMAL HIGH (ref 70–99)

## 2018-04-02 MED ORDER — FLUDEOXYGLUCOSE F - 18 (FDG) INJECTION
13.6700 | Freq: Once | INTRAVENOUS | Status: AC
  Administered 2018-04-02: 13.67 via INTRAVENOUS

## 2018-04-06 ENCOUNTER — Other Ambulatory Visit: Payer: Self-pay | Admitting: Hematology

## 2018-04-14 ENCOUNTER — Inpatient Hospital Stay (HOSPITAL_BASED_OUTPATIENT_CLINIC_OR_DEPARTMENT_OTHER): Payer: Medicare HMO | Admitting: Hematology

## 2018-04-14 VITALS — BP 149/87 | HR 64 | Temp 98.2°F | Resp 18 | Ht 70.0 in | Wt 269.0 lb

## 2018-04-14 DIAGNOSIS — I872 Venous insufficiency (chronic) (peripheral): Secondary | ICD-10-CM

## 2018-04-14 DIAGNOSIS — M7989 Other specified soft tissue disorders: Secondary | ICD-10-CM | POA: Diagnosis not present

## 2018-04-14 DIAGNOSIS — R221 Localized swelling, mass and lump, neck: Secondary | ICD-10-CM

## 2018-04-14 DIAGNOSIS — K5730 Diverticulosis of large intestine without perforation or abscess without bleeding: Secondary | ICD-10-CM

## 2018-04-14 DIAGNOSIS — N189 Chronic kidney disease, unspecified: Secondary | ICD-10-CM

## 2018-04-14 DIAGNOSIS — Z79899 Other long term (current) drug therapy: Secondary | ICD-10-CM

## 2018-04-14 DIAGNOSIS — M129 Arthropathy, unspecified: Secondary | ICD-10-CM

## 2018-04-14 DIAGNOSIS — I8391 Asymptomatic varicose veins of right lower extremity: Secondary | ICD-10-CM

## 2018-04-14 DIAGNOSIS — K760 Fatty (change of) liver, not elsewhere classified: Secondary | ICD-10-CM

## 2018-04-14 DIAGNOSIS — I129 Hypertensive chronic kidney disease with stage 1 through stage 4 chronic kidney disease, or unspecified chronic kidney disease: Secondary | ICD-10-CM

## 2018-04-14 DIAGNOSIS — K8020 Calculus of gallbladder without cholecystitis without obstruction: Secondary | ICD-10-CM

## 2018-04-14 DIAGNOSIS — C8511 Unspecified B-cell lymphoma, lymph nodes of head, face, and neck: Secondary | ICD-10-CM

## 2018-04-14 DIAGNOSIS — F17210 Nicotine dependence, cigarettes, uncomplicated: Secondary | ICD-10-CM

## 2018-04-14 NOTE — Progress Notes (Signed)
HEMATOLOGY/ONCOLOGY CONSULTATION NOTE  Date of Service: 04/14/2018  Patient Care Team: Helane Rima, MD as PCP - General (Family Medicine)  CHIEF COMPLAINTS/PURPOSE OF CONSULTATION:  Concern for a Lymphoproliferative process  HISTORY OF PRESENTING ILLNESS:   Jeffery Wilson is a wonderful 67 y.o. male who has been referred to [place] by Dr. Helane Rima for evaluation and management of his concern for a Lymphoproliferative process. He is accompanied today by his daughter and wife. The pt reports that he is doing well overall.   The pt reports that he first noticed the left jaw mass two years ago which has slowly grown. 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 The pt notes that his PCP Dr. Lavone Neri noticed the mass in July/August at a wellness visit and he was subsequently referred to ENT Dr. Fenton Malling at [facility]. The pt denies pain, discomfort, fevers, chills, night sweats, unexpected weight loss, and any other lumps or bumps. He notes that he is not feeling any differently recently as compared to the last 6 months to a year.   The pt also notes that his right lower leg is also larger than his left, and has been this way all of his life. He notes that his ankle swelling is stable and has varicose veins.   The pt notes hammer toe surgery and arthroscopic left knee surgery. He notes that he was previously diagnosed with CKD but has managed this well and notes his creatinine has nearly normalized. He also notes that has hypertension.   The pt notes that he had Prevnar and Pneumovax last year and regularly receives his annual flu vaccine.   Of note prior to the patient's visit today, pt has had a Submandibular gland biopsy completed on 01/28/18 with results revealing concern for a lymphoproliferative process.   The pt also had a 01/15/18 CT Neck which revealed 3.3  x 4.1 x 5 cm solid LEFT submandibular gland mass. Differential diagnosis includes benign mixed tumor or carcinoma. 2. Prominent though not pathologically enlarged LEFT lymph nodes potentially metastatic if tumor is malignant. 3. For above findings, recommend ENT consultation and histopathologic correlation.  Most recent lab results (01/28/18) of CBC is as follows: all values are WNL.  On review of systems, pt reports left jaw mass, stable energy levels, moving his bowels well, stable ankle swelling, and denies fevers, chills, night sweats, painful jaw mass, discomfort at jaw mass, unexpected weight loss, problems swallowing, pain along the spine, itching, back pain, new fatigue, abdominal pains, problems passing urine, and any other symptoms.   On PMHx the pt reports CKD, HTN, left knee arthroscopic knee surgery in 2016. 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 The pt smokes one cigarette every morning. He drinks 2-3 drinks of whiskey most afternoons, 4-6 on the weekends.  On Family Hx the pt reports father with MI at age 68. Denies blood disorders or cancer.  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 The patient's last visit with [place] was on 03/24/18. He is accompanied today by his wife and daughter. The pt reports that he is doing well overall.   The pt reports that he did not have his biopsy in the interim. The pt denies noticing any new growth to his  left jaw mass, and denies any associated pain. He endorses stable energy levels and denies any other new concerns.   Of note since the patient's last visit, pt has had a PET/CT completed on 04/02/18 with results revealing Known left submandibular mass is hypermetabolic, consistent with the reported history B-cell lymphoma. Additional nonenlarged lymph nodes in the left neck shows low level FDG accumulation, suspicious for tumor involvement. 2. Tiny right  groin lymph node shows low level FDG uptake, indeterminate. Otherwise, no evidence for hypermetabolic disease in the chest, abdomen, or pelvis. 3. Hepatic steatosis. 4. Cholelithiasis. 5. Colonic diverticulosis without diverticulitis.  Lab results (03/24/18) of CBC w/diff, CMP, and Reticulocytes is as follows: all values are WNL except for MCV at 98.3, Creatinine at 1.43, GFR at 49. 04/14/18 LDH at 139  On review of systems, pt reports good energy levels, stable left sided jaw mass, eating well, and denies jaw pain, noticing jaw mass growing, fevers, chills, night sweats, back pains, abdominal pains, and any other symptoms.    MEDICAL HISTORY:  Past Medical History:  Diagnosis Date  . Hypertension   . Renal disorder    pt says stage 3 kidney dz     SURGICAL HISTORY: Past Surgical History:  Procedure Laterality Date  . HAMMER TOE SURGERY    . KNEE SURGERY      SOCIAL HISTORY: Social History   Socioeconomic History  . Marital status: Married    Spouse name: Not on file  . Number of children: Not on file  . Years of education: Not on file  . Highest education level: Not on file  Occupational History  . Not on file  Social Needs  . Financial resource strain: Not on file  . Food insecurity:    Worry: Not on file    Inability: Not on file  . Transportation needs:    Medical: Not on file    Non-medical: Not on file  Tobacco Use  . Smoking status: Never Smoker  . Smokeless tobacco: Never Used  Substance and Sexual Activity  . Alcohol use: Yes    Comment: daily   . Drug use: No  . Sexual activity: Not on file  Lifestyle  . Physical activity:    Days per week: Not on file    Minutes per session: Not on file  . Stress: Not on file  Relationships  . Social connections:    Talks on phone: Not on file    Gets together: Not on file    Attends religious service: Not on file    Active member of club or organization: Not on file    Attends meetings of clubs or organizations:  Not on file    Relationship status: Not on file  . Intimate partner violence:    Fear of current or ex partner: Not on file    Emotionally abused: Not on file    Physically abused: Not on file    Forced sexual activity: Not on file  Other Topics Concern  . Not on file  Social History Narrative  . Not on file    FAMILY HISTORY: No family history on file.  ALLERGIES:  has No Known Allergies.  MEDICATIONS:  Current Outpatient Medications  Medication Sig Dispense Refill  . acetaminophen (TYLENOL) 500 MG tablet Take 1,000 mg by mouth every 6 (six) hours as needed for moderate pain or headache.    . Artificial Tear Solution (GENTEAL TEARS OP) Place 1 drop into both eyes at bedtime.    Marland Kitchen  Coal Tar Extract [phone] PSORIASIS MEDICATED EX) Apply 1 application topically daily.    Marland Kitchen dicyclomine (BENTYL) 20 MG tablet Take 1 tablet (20 mg total) by mouth 3 (three) times daily before meals. As needed for abdominal cramping (Patient not taking: Reported on 01/20/2018) 20 tablet 0  . furosemide (LASIX) 40 MG tablet Take 40 mg by mouth daily.     . metoprolol succinate (TOPROL-XL) 50 MG 24 hr tablet Take 50 mg by mouth daily. Take with or immediately following a meal.    . Multiple Vitamins-Minerals (MULTIVITAMIN PO) Take 1 tablet by mouth daily.    . Omega-3 Fatty Acids (OMEGA 3 PO) Take 1 capsule by mouth 2 (two) times daily.    . ondansetron (ZOFRAN ODT) 4 MG disintegrating tablet Take 1 tablet (4 mg total) by mouth every 8 (eight) hours as needed for nausea or vomiting. (Patient not taking: Reported on 01/20/2018) 20 tablet 0  . pravastatin (PRAVACHOL) 20 MG tablet Take 20 mg by mouth at bedtime.     . Turmeric 500 MG CAPS Take 500 mg by mouth daily.     No current facility-administered medications for this visit.     REVIEW OF SYSTEMS:    A 10+ POINT REVIEW OF SYSTEMS WAS OBTAINED including neurology, dermatology, psychiatry, cardiac, respiratory, lymph, extremities, GI, GU, Musculoskeletal,  constitutional, breasts, reproductive, HEENT.  All pertinent positives are noted in the HPI.  All others are negative.   PHYSICAL EXAMINATION: ECOG PERFORMANCE STATUS: 1 - Symptomatic but completely ambulatory  Vitals:   04/14/18 1219  BP: (!) 149/87  Pulse: 64  Resp: 18  Temp: 98.2 F (36.8 C)  SpO2: 98%   Filed Weights   04/14/18 1219  Weight: 269 lb (122 kg)   .Body mass index is 38.6 kg/m.  GENERAL:alert, in no acute distress and comfortable SKIN: no acute rashes, no significant lesions, some venous stasis skin changes on lower right extremity  EYES: conjunctiva are pink and non-injected, sclera anicteric OROPHARYNX: MMM, no exudates, no oropharyngeal erythema or ulceration NECK: supple, no JVD, palpable movable mass at left submandibular gland LYMPH:  no palpable lymphadenopathy in the cervical, axillary or inguinal regions LUNGS: clear to auscultation b/l with normal respiratory effort HEART: regular rate & rhythm ABDOMEN:  normoactive bowel sounds , non tender, not distended. No palpable hepatosplenomegaly.  Extremity: 1+ pedal edema, venous stasis skin changes on lower right extremity  PSYCH: alert & oriented x 3 with fluent speech NEURO: no focal motor/sensory deficits   LABORATORY DATA:  I have reviewed the data as listed  . CBC Latest Ref Rng & Units 03/24/2018 01/28/2018 05/19/2016  WBC 4.0 - 10.3 K/uL 6.1 6.6 10.0  Hemoglobin 13.0 - 17.1 g/dL 15.1 15.6 14.6  Hematocrit 38.4 - 49.9 % 44.7 46.7 43.5  Platelets 140 - 400 K/uL 203 190 214    . CMP Latest Ref Rng & Units 03/24/2018 01/15/2018 05/19/2016  Glucose 70 - 99 mg/dL 99 - 162(H)  BUN 8 - 23 mg/dL 16 - 24(H)  Creatinine 0.61 - 1.24 mg/dL 1.43(H) 1.50(H) 1.70(H)  Sodium 135 - 145 mmol/L 141 - 138  Potassium 3.5 - 5.1 mmol/L 4.0 - 3.8  Chloride 98 - 111 mmol/L 103 - 101  CO2 22 - 32 mmol/L 29 - 26  Calcium 8.9 - 10.3 mg/dL 10.0 - 9.3  Total Protein 6.5 - 8.1 g/dL 7.9 - 7.5  Total Bilirubin 0.3 - 1.2  mg/dL 0.7 - 0.5  Alkaline Phos 38 - 126 U/L 55 -  53  AST 15 - 41 U/L 24 - 23  ALT 0 - 44 U/L 20 - 20   Component     Latest Ref Rng & Units 03/24/2018  Hepatitis B Surface Ag     Negative Negative  Hep B Core Ab, Tot     Negative Negative  HIV Screen 4th Generation wRfx     Non Reactive Non Reactive  HCV Ab     0.0 - 0.9 s/co ratio <0.1  LDH     98 - 192 U/L 139   . Lab Results  Component Value Date   LDH 139 03/24/2018    01/28/18 Submandibular gland biopsy:    RADIOGRAPHIC STUDIES: I have personally reviewed the radiological images as listed and agreed with the findings in the report. Nm Pet Image Initial (pi) Skull Base To Thigh  Result Date: 04/02/2018 CLINICAL DATA:  Initial treatment strategy for B-cell lymphoma. EXAM: NUCLEAR MEDICINE PET SKULL BASE TO THIGH TECHNIQUE: 13.7 mCi F-18 FDG was injected intravenously. Full-ring PET imaging was performed from the skull base to thigh after the radiotracer. CT data was obtained and used for attenuation correction and anatomic localization. Fasting blood glucose: 113 mg/dl COMPARISON:  CT neck 01/15/2018 FINDINGS: Mediastinal blood pool activity: SUV max 3.6 NECK: The patient's known left-sided submandibular mass is markedly hypermetabolic with SUV max = 6.9. Small left level III cervical lymph nodes show low level FDG uptake ( SUV max = 2.1) suspicious for involvement by lymphoma. Incidental CT findings: none CHEST: No hypermetabolic mediastinal or hilar nodes. No suspicious pulmonary nodules on the CT scan. Incidental CT findings: none ABDOMEN/PELVIS: No abnormal hypermetabolic activity within the liver, pancreas, adrenal glands, or spleen. No hypermetabolic lymph nodes in the abdomen or pelvis. Small lymph nodes are seen along the pelvic sidewalls bilaterally but show no discernible hypermetabolism. 8 mm short axis right groin node shows low level uptake with SUV max = 2.3. Incidental CT findings: Diffusely decreased attenuation of  liver parenchyma is compatible with fatty deposition. Calcified gallstones noted. Diverticular changes noted left colon without diverticulitis. SKELETON: No focal hypermetabolic activity to suggest skeletal metastasis. Incidental CT findings: none IMPRESSION: 1. Known left submandibular mass is hypermetabolic, consistent with the reported history B-cell lymphoma. Additional nonenlarged lymph nodes in the left neck shows low level FDG accumulation, suspicious for tumor involvement. 2. Tiny right groin lymph node shows low level FDG uptake, indeterminate. Otherwise, no evidence for hypermetabolic disease in the chest, abdomen, or pelvis. 3. Hepatic steatosis. 4. Cholelithiasis. 5. Colonic diverticulosis without diverticulitis. Electronically Signed   By: Misty Stanley M.D.   On: 04/02/2018 14:59    ASSESSMENT & PLAN:   67 y.o. male with  1. 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 Ernesto Rutherford for an excisional/incisional biopsy whom the pt already saw and prefers to see again  -Recommended that the pt use graded sports compression socks to prevent/help with venous stasis dermatitis  -Discussed pt labwork from 03/24/18; blood counts and chemistries stable, normal LDH.  -Discussed the 04/02/18 PET/CT which revealed Known left submandibular mass is hypermetabolic, consistent with the reported history B-cell  lymphoma. Additional nonenlarged lymph nodes in the left neck shows low level FDG accumulation, suspicious for tumor involvement. 2. Tiny right groin lymph node shows low level FDG uptake, indeterminate. Otherwise, no evidence for  hypermetabolic disease in the chest, abdomen, or pelvis. 3. Hepatic steatosis. 4. Cholelithiasis. 5. Colonic diverticulosis without diverticulitis. -03/24/18 Hep B, Hep C and HIV labs were negative -Proceed with ENT follow up with surgical biopsy -Discussed that the PET/CT findings are reassuring but not fully diagnostic for completely ruling out bone marrow involvement -Discussed the recommendation to pursue a BM Bx for complete staging and therefore treatment options. If BM Bx is positive indicating Stage IV, would recommend Rituxan. If BM Bx is negative, would have option for local radiation. -The pt notes that he would not like to receive radiation, and therefore the BM Bx is not necessary. He would like to pursue Rituxan.  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 The patient knows to call the clinic with any problems, questions or concerns.  The total time spent in the appt was 30 minutes and more than 50% was on counseling and direct patient cares.     Sullivan Lone MD MS AAHIVMS [facility] Hematology/Oncology Physician [facility]  (Office):       [phone] (Work cell):  [phone] (Fax):           [phone]  04/14/2018 1:44 PM  I, Baldwin [place], am acting as a scribe for Dr. [name]  .I have reviewed the above documentation for accuracy and completeness, and I agree with the above. Brunetta Genera MD

## 2018-04-15 ENCOUNTER — Telehealth: Payer: Self-pay

## 2018-04-15 NOTE — Telephone Encounter (Signed)
[facility] to speak with nurse of Dr. Owens Shark regarding excisional bx of submandibular gland mass [phone]. Spoke with Apolonio Schneiders, Network engineer, who noted that pt did have initial visit with Dr. Owens Shark and note on 8/21 placed for [facility] to provide slides in order to confirm or deny suspicion of disease. Nothing showing that bx performed at this time. LVM on nurse triage line requesting call back in order to have excisional bx moved forward per Dr. [name] as initial bx by Dr. Ernesto Rutherford was not definitive of diagnostic. Nurse number provided [phone].

## 2018-04-16 ENCOUNTER — Telehealth: Payer: Self-pay

## 2018-04-16 NOTE — Telephone Encounter (Signed)
LVM with [facility] yesterday afternoon and received call back from [place] this morning. Per slide review by Dr. 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 No flow cytometric analysis or FISH studies are available." Discussed with Dr. [name] who would like to have excisional biopsy performed that would allow for these studies to be completed, despite clarification that tumor does not need to be removed in full at this time.  Called back to [facility] and spoke with Ander Purpura, Secretary, as well as, Maudie Mercury, RN with Dr. Owens Shark. Based on note from Dr. Owens Shark, if slides showed lymphoma, excisional bx suggested to be performed. Kim discussed with Dr. Owens Shark and called back to notify that he would be willing to perform procedure and pt would be notified from their office of appt.   Dr. [name] made aware.

## 2018-05-11 NOTE — Progress Notes (Signed)
HEMATOLOGY/ONCOLOGY CLINIC NOTE  Date of Service: 05/12/2018  Patient Care Team: Helane Rima, MD as PCP - General (Family Medicine)  CHIEF COMPLAINTS/PURPOSE OF CONSULTATION:  Concern for a Lymphoproliferative process  HISTORY OF PRESENTING ILLNESS:   Jeffery Wilson is a wonderful 67 y.o. male who has been referred to [place] by Dr. Helane Rima for evaluation and management of his concern for a Lymphoproliferative process. He is accompanied today by his daughter and wife. The pt reports that he is doing well overall.   The pt reports that he first noticed the left jaw mass two years ago which has slowly grown. 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 The pt notes that his PCP Dr. Lavone Neri noticed the mass in July/August at a wellness visit and he was subsequently referred to ENT Dr. Fenton Malling at [facility]. The pt denies pain, discomfort, fevers, chills, night sweats, unexpected weight loss, and any other lumps or bumps. He notes that he is not feeling any differently recently as compared to the last 6 months to a year.   The pt also notes that his right lower leg is also larger than his left, and has been this way all of his life. He notes that his ankle swelling is stable and has varicose veins.   The pt notes hammer toe surgery and arthroscopic left knee surgery. He notes that he was previously diagnosed with CKD but has managed this well and notes his creatinine has nearly normalized. He also notes that has hypertension.   The pt notes that he had Prevnar and Pneumovax last year and regularly receives his annual flu vaccine.   Of note prior to the patient's visit today, pt has had a Submandibular gland biopsy completed on 01/28/18 with results revealing concern for a lymphoproliferative process.   The pt also had a 01/15/18 CT Neck which revealed 3.3 x 4.1  x 5 cm solid LEFT submandibular gland mass. Differential diagnosis includes benign mixed tumor or carcinoma. 2. Prominent though not pathologically enlarged LEFT lymph nodes potentially metastatic if tumor is malignant. 3. For above findings, recommend ENT consultation and histopathologic correlation.  Most recent lab results (01/28/18) of CBC is as follows: all values are WNL.  On review of systems, pt reports left jaw mass, stable energy levels, moving his bowels well, stable ankle swelling, and denies fevers, chills, night sweats, painful jaw mass, discomfort at jaw mass, unexpected weight loss, problems swallowing, pain along the spine, itching, back pain, new fatigue, abdominal pains, problems passing urine, and any other symptoms.   On PMHx the pt reports CKD, HTN, left knee arthroscopic knee surgery in 2016. 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 The pt smokes one cigarette every morning. He drinks 2-3 drinks of whiskey most afternoons, 4-6 on the weekends.  On Family Hx the pt reports father with MI at age 27. Denies blood disorders or cancer.  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 The patient's last visit with [place] was on 04/14/18. He is accompanied today by his daughter and wife. The pt reports that he is doing well overall.   The pt reports that he has not developed any new concerns in the interim. He has received his flu vaccine this year  and believes he has received both pneumonia vaccinations in the last 5 years.   Of note prior to today's visit, the pt had a Left cervical lymph node incisional biopsy completed on 04/24/18 which revealed Follicular Lymphoma, Grade 1-2.   On review of systems, pt reports stable left jaw mass, good energy levels, eating well, stable weight, and denies fevers, chills, night sweats, abdominal pains, leg swelling, and any other symptoms.    MEDICAL  HISTORY:  Past Medical History:  Diagnosis Date  . Hypertension   . Renal disorder    pt says stage 3 kidney dz     SURGICAL HISTORY: Past Surgical History:  Procedure Laterality Date  . HAMMER TOE SURGERY    . KNEE SURGERY      SOCIAL HISTORY: Social History   Socioeconomic History  . Marital status: Married    Spouse name: Not on file  . Number of children: Not on file  . Years of education: Not on file  . Highest education level: Not on file  Occupational History  . Not on file  Social Needs  . Financial resource strain: Not on file  . Food insecurity:    Worry: Not on file    Inability: Not on file  . Transportation needs:    Medical: Not on file    Non-medical: Not on file  Tobacco Use  . Smoking status: Never Smoker  . Smokeless tobacco: Never Used  Substance and Sexual Activity  . Alcohol use: Yes    Comment: daily   . Drug use: No  . Sexual activity: Not on file  Lifestyle  . Physical activity:    Days per week: Not on file    Minutes per session: Not on file  . Stress: Not on file  Relationships  . Social connections:    Talks on phone: Not on file    Gets together: Not on file    Attends religious service: Not on file    Active member of club or organization: Not on file    Attends meetings of clubs or organizations: Not on file    Relationship status: Not on file  . Intimate partner violence:    Fear of current or ex partner: Not on file    Emotionally abused: Not on file    Physically abused: Not on file    Forced sexual activity: Not on file  Other Topics Concern  . Not on file  Social History Narrative  . Not on file    FAMILY HISTORY: History reviewed. No pertinent family history.  ALLERGIES:  has No Known Allergies.  MEDICATIONS:  Current Outpatient Medications  Medication Sig Dispense Refill  . acetaminophen (TYLENOL) 500 MG tablet Take 1,000 mg by mouth every 6 (six) hours as needed for moderate pain or headache.    .  Artificial Tear Solution (GENTEAL TEARS OP) Place 1 drop into both eyes at bedtime.    Beryl Meager Tar Extract [phone] PSORIASIS MEDICATED EX) Apply 1 application topically daily.    Marland Kitchen dicyclomine (BENTYL) 20 MG tablet Take 1 tablet (20 mg total) by mouth 3 (three) times daily before meals. As needed for abdominal cramping (Patient not taking: Reported on 01/20/2018) 20 tablet 0  . furosemide (LASIX) 40 MG tablet Take 40 mg by mouth daily.     . metoprolol succinate (TOPROL-XL) 50 MG 24 hr tablet Take 50 mg by mouth daily. Take with or immediately following a meal.    . Milk Thistle 1000 MG  CAPS Take 1 tablet by mouth.    . Multiple Vitamins-Minerals (MULTIVITAMIN PO) Take 1 tablet by mouth daily.    . Omega-3 Fatty Acids (OMEGA 3 PO) Take 1 capsule by mouth 2 (two) times daily.    . ondansetron (ZOFRAN ODT) 4 MG disintegrating tablet Take 1 tablet (4 mg total) by mouth every 8 (eight) hours as needed for nausea or vomiting. (Patient not taking: Reported on 01/20/2018) 20 tablet 0  . pravastatin (PRAVACHOL) 20 MG tablet Take 20 mg by mouth at bedtime.     . Turmeric 500 MG CAPS Take 500 mg by mouth daily.     No current facility-administered medications for this visit.     REVIEW OF SYSTEMS:    A 10+ POINT REVIEW OF SYSTEMS WAS OBTAINED including neurology, dermatology, psychiatry, cardiac, respiratory, lymph, extremities, GI, GU, Musculoskeletal, constitutional, breasts, reproductive, HEENT.  All pertinent positives are noted in the HPI.  All others are negative.   PHYSICAL EXAMINATION: ECOG PERFORMANCE STATUS: 1 - Symptomatic but completely ambulatory  Vitals:   05/12/18 0950  BP: (!) 147/90  Pulse: (!) 57  Resp: 18  Temp: 98.4 F (36.9 C)  SpO2: 100%   Filed Weights   05/12/18 0950  Weight: 270 lb 1.6 oz (122.5 kg)   .Body mass index is 38.76 kg/m.   GENERAL:alert, in no acute distress and comfortable SKIN: no acute rashes, no significant lesions, some venous stasis skin changes on  lower right extremity  EYES: conjunctiva are pink and non-injected, sclera anicteric OROPHARYNX: MMM, no exudates, no oropharyngeal erythema or ulceration NECK: supple, no JVD, palpable movable mass at left submandibular gland LYMPH:  no palpable lymphadenopathy in the cervical, axillary or inguinal regions LUNGS: clear to auscultation b/l with normal respiratory effort HEART: regular rate & rhythm ABDOMEN:  normoactive bowel sounds , non tender, not distended. No palpable hepatosplenomegaly.  Extremity: 1+ pedal edema, venous stasis skin changes on lower right extremity  PSYCH: alert & oriented x 3 with fluent speech NEURO: no focal motor/sensory deficits    LABORATORY DATA:  I have reviewed the data as listed  . CBC Latest Ref Rng & Units 03/24/2018 01/28/2018 05/19/2016  WBC 4.0 - 10.3 K/uL 6.1 6.6 10.0  Hemoglobin 13.0 - 17.1 g/dL 15.1 15.6 14.6  Hematocrit 38.4 - 49.9 % 44.7 46.7 43.5  Platelets 140 - 400 K/uL 203 190 214    . CMP Latest Ref Rng & Units 03/24/2018 01/15/2018 05/19/2016  Glucose 70 - 99 mg/dL 99 - 162(H)  BUN 8 - 23 mg/dL 16 - 24(H)  Creatinine 0.61 - 1.24 mg/dL 1.43(H) 1.50(H) 1.70(H)  Sodium 135 - 145 mmol/L 141 - 138  Potassium 3.5 - 5.1 mmol/L 4.0 - 3.8  Chloride 98 - 111 mmol/L 103 - 101  CO2 22 - 32 mmol/L 29 - 26  Calcium 8.9 - 10.3 mg/dL 10.0 - 9.3  Total Protein 6.5 - 8.1 g/dL 7.9 - 7.5  Total Bilirubin 0.3 - 1.2 mg/dL 0.7 - 0.5  Alkaline Phos 38 - 126 U/L 55 - 53  AST 15 - 41 U/L 24 - 23  ALT 0 - 44 U/L 20 - 20   Component     Latest Ref Rng & Units 03/24/2018  Hepatitis B Surface Ag     Negative Negative  Hep B Core Ab, Tot     Negative Negative  HIV Screen 4th Generation wRfx     Non Reactive Non Reactive  HCV Ab     0.0 -  0.9 s/co ratio <0.1  LDH     98 - 192 U/L 139   . Lab Results  Component Value Date   LDH 139 03/24/2018    01/28/18 Submandibular gland biopsy:    RADIOGRAPHIC STUDIES: I have personally reviewed the  radiological images as listed and agreed with the findings in the report. No results found.  ASSESSMENT & PLAN:   67 y.o. male with  1. 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 Additional nonenlarged lymph nodes in the left neck shows low level FDG accumulation, suspicious for tumor involvement. 2. Tiny right groin lymph node shows low level FDG uptake, indeterminate. Otherwise, no evidence for hypermetabolic disease in the chest, abdomen, or pelvis. 3. Hepatic steatosis. 4. Cholelithiasis. 5. Colonic diverticulosis without diverticulitis.   PLAN: -Recommended that the pt use graded sports compression socks to prevent/help with venous stasis dermatitis  -Discussed that the PET/CT findings are reassuring but not fully diagnostic for completely ruling out bone marrow involvement however as the pt doesn't wish to pursue radiation, the BM Bx is not necessary  -Discussed the 04/24/18 Left cervical lymph node incisional biopsy which revealed Follicular lymphoma, Grade 1-2 with a 10% proliferation rate -As patient wishes to not pursue radiation, but prefers treatment over watchful observation, I recommend treatment with Rituxan weekly for four weeks. Discussed possible risk of allergic reactions.  -Would repeat imaging in 4-6 weeks following 4th dose of Rituxan -Will set the pt up for chemotherapy counseling and provided supplemental information  -Advised that the pt ensure he has received both pneumonia vaccines in the last 5 years with his PCP   Chemo-counseling for  Rituxan weekly x 4 for Follicular lymphoma Please schedule weekly Rituxan to start on 05/19/2018 x 4 doses  Labs with each treatment weekly x 4 RTC with Dr [name] with C2 of Rituxan on 05/26/2018 with labs   All of the patients questions were answered with apparent satisfaction. The patient knows to call the clinic with any problems, questions or concerns.  The total time spent in the appt was 25 minutes and more than 50% was on counseling and direct patient cares.     Sullivan Lone MD MS AAHIVMS [facility] Hematology/Oncology Physician [facility]  (Office):       [phone] (Work cell):  [phone] (Fax):           [phone]  05/12/2018 10:42 AM  I, Baldwin Jamaica, am acting as a scribe for Dr. Sullivan Lone.   .I have reviewed the above documentation for accuracy and completeness, and I agree with the above. Brunetta Genera MD

## 2018-05-12 ENCOUNTER — Inpatient Hospital Stay: Payer: Medicare HMO | Attending: Hematology | Admitting: Hematology

## 2018-05-12 ENCOUNTER — Encounter: Payer: Self-pay | Admitting: Hematology

## 2018-05-12 ENCOUNTER — Telehealth: Payer: Self-pay | Admitting: *Deleted

## 2018-05-12 VITALS — BP 147/90 | HR 57 | Temp 98.4°F | Resp 18 | Ht 70.0 in | Wt 270.1 lb

## 2018-05-12 DIAGNOSIS — K5730 Diverticulosis of large intestine without perforation or abscess without bleeding: Secondary | ICD-10-CM | POA: Diagnosis not present

## 2018-05-12 DIAGNOSIS — Z79899 Other long term (current) drug therapy: Secondary | ICD-10-CM | POA: Diagnosis not present

## 2018-05-12 DIAGNOSIS — K760 Fatty (change of) liver, not elsewhere classified: Secondary | ICD-10-CM | POA: Insufficient documentation

## 2018-05-12 DIAGNOSIS — I129 Hypertensive chronic kidney disease with stage 1 through stage 4 chronic kidney disease, or unspecified chronic kidney disease: Secondary | ICD-10-CM | POA: Diagnosis not present

## 2018-05-12 DIAGNOSIS — I872 Venous insufficiency (chronic) (peripheral): Secondary | ICD-10-CM | POA: Insufficient documentation

## 2018-05-12 DIAGNOSIS — N189 Chronic kidney disease, unspecified: Secondary | ICD-10-CM | POA: Insufficient documentation

## 2018-05-12 DIAGNOSIS — C8211 Follicular lymphoma grade II, lymph nodes of head, face, and neck: Secondary | ICD-10-CM | POA: Insufficient documentation

## 2018-05-12 DIAGNOSIS — Z7189 Other specified counseling: Secondary | ICD-10-CM

## 2018-05-12 DIAGNOSIS — K8020 Calculus of gallbladder without cholecystitis without obstruction: Secondary | ICD-10-CM | POA: Insufficient documentation

## 2018-05-12 DIAGNOSIS — F17210 Nicotine dependence, cigarettes, uncomplicated: Secondary | ICD-10-CM | POA: Diagnosis not present

## 2018-05-12 DIAGNOSIS — Z5112 Encounter for antineoplastic immunotherapy: Secondary | ICD-10-CM | POA: Insufficient documentation

## 2018-05-12 DIAGNOSIS — M129 Arthropathy, unspecified: Secondary | ICD-10-CM | POA: Diagnosis not present

## 2018-05-12 NOTE — Progress Notes (Signed)
START ON PATHWAY REGIMEN - Lymphoma and CLL     Administer weekly:     Rituximab   **Always confirm dose/schedule in your pharmacy ordering system**  Patient Characteristics: Follicular Lymphoma, Grades 1, 2, and 3A, First Line, Stage I / II Disease Type: Follicular Lymphoma Disease Type: Not Applicable Disease Type: Not Applicable Benedict Needy Stage: I Tumor Grade: 2 Line of Therapy: First Line Intent of Therapy: Non-Curative / Palliative Intent, Discussed with Patient

## 2018-05-12 NOTE — Telephone Encounter (Signed)
Contacted Dr. Saul Fordyce office @ [facility] [phone]. Requested patient's biopsy results faxed to Dr. Grier Mitts office. Nurse Ander Purpura stated she would send. Results received.

## 2018-05-12 NOTE — Patient Instructions (Signed)
Thank you for choosing [facility] to provide your oncology and hematology care.  To afford each patient quality time with our providers, please arrive 30 minutes before your scheduled appointment time.  If you arrive late for your appointment, you may be asked to reschedule.  We strive to give you quality time with our providers, and arriving late affects you and other patients whose appointments are after yours.    If you are a no show for multiple scheduled visits, you may be dismissed from the clinic at the providers discretion.     Again, thank you for choosing [facility], our hope is that these requests will decrease the amount of time that you wait before being seen by our physicians.  ______________________________________________________________________   Should you have questions after your visit to the [facility], please contact our office at [phone] between the hours of 8:30 and 4:30 p.m.    Voicemails left after 4:30p.m will not be returned until the following business day.     For prescription refill requests, please have your pharmacy contact us directly.  Please also try to allow 48 hours for prescription requests.     Please contact the scheduling department for questions regarding scheduling.  For scheduling of procedures such as PET scans, CT scans, MRI, Ultrasound, etc please contact central scheduling at [phone].     Resources For Cancer Patients and Caregivers:    Oncolink.org:  A wonderful resource for patients and healthcare providers for information regarding your disease, ways to tract your treatment, what to expect, etc.      American Cancer Society:  800-227-2345  Can help patients locate various types of support and financial assistance   Cancer Care: 1-800-813-HOPE (4673) Provides financial assistance, online support groups, medication/co-pay assistance.     [place] DSS:  336-641-3447 Where to apply  for food stamps, Medicaid, and utility assistance   Medicare Rights Center: 800-333-4114 Helps people with Medicare understand their rights and benefits, navigate the Medicare system, and secure the quality healthcare they deserve   SCAT: 336-333-6589 [place] [employer]'s shared-ride transportation service for eligible riders who have a disability that prevents them from riding the fixed route bus.     For additional information on assistance programs please contact our social worker:   Abigail Elmore:  [phone]  

## 2018-05-13 ENCOUNTER — Inpatient Hospital Stay: Payer: Medicare HMO

## 2018-05-14 ENCOUNTER — Telehealth: Payer: Self-pay | Admitting: Hematology

## 2018-05-14 NOTE — Telephone Encounter (Signed)
Appointments will get added, had to add the appointments to the add on book.  Per 11/19 los.

## 2018-05-19 ENCOUNTER — Inpatient Hospital Stay: Payer: Medicare HMO

## 2018-05-19 ENCOUNTER — Inpatient Hospital Stay (HOSPITAL_BASED_OUTPATIENT_CLINIC_OR_DEPARTMENT_OTHER): Payer: Medicare HMO | Admitting: Medical

## 2018-05-19 VITALS — BP 142/80 | HR 62 | Temp 97.5°F | Resp 18

## 2018-05-19 DIAGNOSIS — Z7189 Other specified counseling: Secondary | ICD-10-CM

## 2018-05-19 DIAGNOSIS — C8211 Follicular lymphoma grade II, lymph nodes of head, face, and neck: Secondary | ICD-10-CM

## 2018-05-19 DIAGNOSIS — T8090XA Unspecified complication following infusion and therapeutic injection, initial encounter: Secondary | ICD-10-CM | POA: Diagnosis not present

## 2018-05-19 DIAGNOSIS — Z5112 Encounter for antineoplastic immunotherapy: Secondary | ICD-10-CM | POA: Diagnosis not present

## 2018-05-19 LAB — CMP (CANCER CENTER ONLY)
ALT: 18 U/L (ref 0–44)
AST: 19 U/L (ref 15–41)
Albumin: 4.2 g/dL (ref 3.5–5.0)
Alkaline Phosphatase: 59 U/L (ref 38–126)
Anion gap: 10 (ref 5–15)
BUN: 19 mg/dL (ref 8–23)
CHLORIDE: 104 mmol/L (ref 98–111)
CO2: 26 mmol/L (ref 22–32)
CREATININE: 1.45 mg/dL — AB (ref 0.61–1.24)
Calcium: 9.8 mg/dL (ref 8.9–10.3)
GFR, EST NON AFRICAN AMERICAN: 49 mL/min — AB (ref >60)
GFR, Est AFR Am: 57 mL/min — ABNORMAL LOW (ref >60)
GLUCOSE: 93 mg/dL (ref 70–99)
POTASSIUM: 3.7 mmol/L (ref 3.5–5.1)
Sodium: 140 mmol/L (ref 135–145)
Total Bilirubin: 0.8 mg/dL (ref 0.3–1.2)
Total Protein: 7.4 g/dL (ref 6.5–8.1)

## 2018-05-19 LAB — CBC WITH DIFFERENTIAL/PLATELET
ABS IMMATURE GRANULOCYTES: 0.01 10*3/uL (ref 0.00–0.07)
BASOS PCT: 1 %
Basophils Absolute: 0 10*3/uL (ref 0.0–0.1)
Eosinophils Absolute: 0.1 10*3/uL (ref 0.0–0.5)
Eosinophils Relative: 3 %
HCT: 45.3 % (ref 39.0–52.0)
HEMOGLOBIN: 15.6 g/dL (ref 13.0–17.0)
IMMATURE GRANULOCYTES: 0 %
LYMPHS PCT: 28 %
Lymphs Abs: 1.5 10*3/uL (ref 0.7–4.0)
MCH: 33.3 pg (ref 26.0–34.0)
MCHC: 34.4 g/dL (ref 30.0–36.0)
MCV: 96.6 fL (ref 80.0–100.0)
MONO ABS: 0.7 10*3/uL (ref 0.1–1.0)
MONOS PCT: 12 %
NEUTROS ABS: 3.2 10*3/uL (ref 1.7–7.7)
NEUTROS PCT: 56 %
Platelets: 173 10*3/uL (ref 150–400)
RBC: 4.69 MIL/uL (ref 4.22–5.81)
RDW: 12.7 % (ref 11.5–15.5)
WBC: 5.6 10*3/uL (ref 4.0–10.5)
nRBC: 0 % (ref 0.0–0.2)

## 2018-05-19 MED ORDER — DIPHENHYDRAMINE HCL 25 MG PO CAPS
50.0000 mg | ORAL_CAPSULE | Freq: Once | ORAL | Status: AC
  Administered 2018-05-19: 50 mg via ORAL

## 2018-05-19 MED ORDER — SODIUM CHLORIDE 0.9 % IV SOLN
375.0000 mg/m2 | Freq: Once | INTRAVENOUS | Status: AC
  Administered 2018-05-19: 900 mg via INTRAVENOUS
  Filled 2018-05-19: qty 50

## 2018-05-19 MED ORDER — SODIUM CHLORIDE 0.9 % IV SOLN
Freq: Once | INTRAVENOUS | Status: AC
  Administered 2018-05-19: 09:00:00 via INTRAVENOUS
  Filled 2018-05-19: qty 250

## 2018-05-19 MED ORDER — ACETAMINOPHEN 325 MG PO TABS
650.0000 mg | ORAL_TABLET | Freq: Once | ORAL | Status: AC
  Administered 2018-05-19: 650 mg via ORAL

## 2018-05-19 MED ORDER — DIPHENHYDRAMINE HCL 25 MG PO CAPS
ORAL_CAPSULE | ORAL | Status: AC
  Filled 2018-05-19: qty 2

## 2018-05-19 MED ORDER — FAMOTIDINE IN NACL 20-0.9 MG/50ML-% IV SOLN
20.0000 mg | Freq: Once | INTRAVENOUS | Status: AC | PRN
  Administered 2018-05-19: 20 mg via INTRAVENOUS

## 2018-05-19 MED ORDER — ACETAMINOPHEN 325 MG PO TABS
ORAL_TABLET | ORAL | Status: AC
  Filled 2018-05-19: qty 2

## 2018-05-19 MED ORDER — DEXAMETHASONE SODIUM PHOSPHATE 10 MG/ML IJ SOLN
INTRAMUSCULAR | Status: AC
  Filled 2018-05-19: qty 1

## 2018-05-19 MED ORDER — SODIUM CHLORIDE 0.9 % IV SOLN: 10.0000 mg | Freq: Once | INTRAVENOUS | Status: DC

## 2018-05-19 MED ORDER — DEXAMETHASONE SODIUM PHOSPHATE 10 MG/ML IJ SOLN
10.0000 mg | Freq: Once | INTRAMUSCULAR | Status: AC
  Administered 2018-05-19: 10 mg via INTRAVENOUS

## 2018-05-19 NOTE — Progress Notes (Signed)
1306: Pt with 1.5 inch red circle on L cheek. Earlobes are also bilaterally reddened in color.  Pt with no other complaints. VSS. Rituxan placed on hold, Normal saline restarted. Sandi Mealy, PA notified to evaluate patient.  1351: Pt. VSS. Redness in face starting to decrease. Rituxan infusion restarted. See MAR.

## 2018-05-19 NOTE — Patient Instructions (Signed)
[facility] Discharge Instructions for Patients Receiving Chemotherapy  Today you received the following chemotherapy agents: rituximab (Rituxan).   To help prevent nausea and vomiting after your treatment, we encourage you to take your nausea medication as directed by your physician.     If you develop nausea and vomiting that is not controlled by your nausea medication, call the clinic.   BELOW ARE SYMPTOMS THAT SHOULD BE REPORTED IMMEDIATELY:  *FEVER GREATER THAN 100.5 F  *CHILLS WITH OR WITHOUT FEVER  NAUSEA AND VOMITING THAT IS NOT CONTROLLED WITH YOUR NAUSEA MEDICATION  *UNUSUAL SHORTNESS OF BREATH  *UNUSUAL BRUISING OR BLEEDING  TENDERNESS IN MOUTH AND THROAT WITH OR WITHOUT PRESENCE OF ULCERS  *URINARY PROBLEMS  *BOWEL PROBLEMS  UNUSUAL RASH Items with * indicate a potential emergency and should be followed up as soon as possible.  Feel free to call the clinic should you have any questions or concerns. The clinic phone number is [phone].  Please show the [place] at check-in to the Emergency Department and triage nurse.   Rituximab injection What is this medicine? RITUXIMAB (ri TUX i mab) is a monoclonal antibody. It is used to treat certain types of cancer like non-Hodgkin lymphoma and chronic lymphocytic leukemia. It is also used to treat rheumatoid arthritis, granulomatosis with polyangiitis (or Wegener's granulomatosis), and microscopic polyangiitis. This medicine may be used for other purposes; ask your health care provider or pharmacist if you have questions. COMMON BRAND NAME(S): Rituxan What should I tell my health care provider before I take this medicine? They need to know if you have any of these conditions: -heart disease -infection (especially a virus infection such as hepatitis B, chickenpox, cold sores, or herpes) -immune system problems -irregular heartbeat -kidney disease -lung or breathing disease, like  asthma -recently received or scheduled to receive a vaccine -an unusual or allergic reaction to rituximab, mouse proteins, other medicines, foods, dyes, or preservatives -pregnant or trying to get pregnant -breast-feeding How should I use this medicine? This medicine is for infusion into a vein. It is administered in a hospital or clinic by a specially trained health care professional. A special MedGuide will be given to you by the pharmacist with each prescription and refill. Be sure to read this information carefully each time. Talk to your pediatrician regarding the use of this medicine in children. This medicine is not approved for use in children. Overdosage: If you think you have taken too much of this medicine contact a poison control center or emergency room at once. NOTE: This medicine is only for you. Do not share this medicine with others. What if I miss a dose? It is important not to miss a dose. Call your doctor or health care professional if you are unable to keep an appointment. What may interact with this medicine? -cisplatin -other medicines for arthritis like disease modifying antirheumatic drugs or tumor necrosis factor inhibitors -live virus vaccines This list may not describe all possible interactions. Give your health care provider a list of all the medicines, herbs, non-prescription drugs, or dietary supplements you use. Also tell them if you smoke, drink alcohol, or use illegal drugs. Some items may interact with your medicine. What should I watch for while using this medicine? Your condition will be monitored carefully while you are receiving this medicine. You may need blood work done while you are taking this medicine. This medicine can cause serious allergic reactions. To reduce your risk you may need to take medicine  before treatment with this medicine. Take your medicine as directed. In some patients, this medicine may cause a serious brain infection that may cause  death. If you have any problems seeing, thinking, speaking, walking, or standing, tell your doctor right away. If you cannot reach your doctor, urgently seek other source of medical care. Call your doctor or health care professional for advice if you get a fever, chills or sore throat, or other symptoms of a cold or flu. Do not treat yourself. This drug decreases your body's ability to fight infections. Try to avoid being around people who are sick. Do not become pregnant while taking this medicine or for 12 months after stopping it. Women should inform their doctor if they wish to become pregnant or think they might be pregnant. There is a potential for serious side effects to an unborn child. Talk to your health care professional or pharmacist for more information. What side effects may I notice from receiving this medicine? Side effects that you should report to your doctor or health care professional as soon as possible: -breathing problems -chest pain -dizziness or feeling faint -fast, irregular heartbeat -low blood counts - this medicine may decrease the number of white blood cells, red blood cells and platelets. You may be at increased risk for infections and bleeding. -mouth sores -redness, blistering, peeling or loosening of the skin, including inside the mouth (this can be added for any serious or exfoliative rash that could lead to hospitalization) -signs of infection - fever or chills, cough, sore throat, pain or difficulty passing urine -signs and symptoms of kidney injury like trouble passing urine or change in the amount of urine -signs and symptoms of liver injury like dark yellow or brown urine; general ill feeling or flu-like symptoms; light-colored stools; loss of appetite; nausea; right upper belly pain; unusually weak or tired; yellowing of the eyes or skin -stomach pain -vomiting Side effects that usually do not require medical attention (report to your doctor or health care  professional if they continue or are bothersome): -headache -joint pain -muscle cramps or muscle pain This list may not describe all possible side effects. Call your doctor for medical advice about side effects. You may report side effects to FDA at 1-800-FDA-1088. Where should I keep my medicine? This drug is given in a hospital or clinic and will not be stored at home. NOTE: This sheet is a summary. It may not cover all possible information. If you have questions about this medicine, talk to your doctor, pharmacist, or health care provider.  2018 Elsevier/Gold Standard (2016-01-17 15:28:09)

## 2018-05-20 NOTE — Progress Notes (Signed)
    DATE:  05/19/2018                                          X CHEMO/IMMUNOTHERAPY REACTION             MD: Dr. Sullivan Lone     AGENT/BLOOD [place]:               Rituximab   AGENT/BLOOD PRODUCT RECEIVING IMMEDIATELY PRIOR TO REACTION:           Rituximab   VS: BP:      140/76   P:       58       SPO2:       100% on room air                  REACTION(S):           Erythema of the left cheek with swelling of the left cheek and  left earlobe. PREMEDS:       Tylenol 650 mg p.o. x1, dexamethasone 10 mg IV, and Benadryl 50 mg p.o.     INTERVENTION: Rituximab was paused and the patient was given Pepcid 20 mg IV x1.   Review of Systems  Review of Systems  Constitutional: Negative for chills, diaphoresis and fever.  HENT: Negative for trouble swallowing and voice change.   Respiratory: Negative for cough, chest tightness, shortness of breath and wheezing.   Cardiovascular: Negative for chest pain and palpitations.  Gastrointestinal: Negative for abdominal pain, constipation, diarrhea, nausea and vomiting.  Musculoskeletal: Negative for back pain and myalgias.  Skin:       Erythema of the left cheek with swelling of the left cheek and left earlobe.  Neurological: Negative for dizziness, light-headedness and headaches.     Physical Exam  Physical Exam  Constitutional: No distress.  HENT:  Head: Normocephalic and atraumatic.  Cardiovascular: Normal rate, regular rhythm and normal heart sounds. Exam reveals no gallop and no friction rub.  No murmur heard. Pulmonary/Chest: Effort normal and breath sounds normal. No respiratory distress. He has no wheezes. He has no rales.  Lymphadenopathy:       Head (left side): Submandibular adenopathy present.  Neurological: He is alert.  Skin: Skin is warm and dry. No rash noted. He is not diaphoretic. There is erythema.  Erythema of left cheek with swelling of the left cheek and left earlobe.    OUTCOME:           Erythema and  swelling of the left cheek and left earlobe resolved.  The patient's rituximab was restarted at 200 mg/h.  This was reduced from 250 mg/h due to his reaction at that level.  He was able to restart and complete rituximab without any additional issues of concern.         Sandi Mealy, MHS, PA-C

## 2018-05-20 NOTE — Progress Notes (Signed)
1610 05/19/18: Pt VSS. No signs of redness on face or earlobes. Infusion complete.

## 2018-05-20 NOTE — Progress Notes (Signed)
HEMATOLOGY/ONCOLOGY CLINIC NOTE  Date of Service: 05/25/2018  Patient Care Team: Helane Rima, MD as PCP - General (Family Medicine)  CHIEF COMPLAINTS/PURPOSE OF CONSULTATION:  Concern for a Lymphoproliferative process  HISTORY OF PRESENTING ILLNESS:   Jeffery Wilson is a wonderful 67 y.o. male who has been referred to [place] by Dr. Helane Rima for evaluation and management of his concern for a Lymphoproliferative process. He is accompanied today by his daughter and wife. The pt reports that he is doing well overall.   The pt reports that he first noticed the left jaw mass two years ago which has slowly grown. 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 The pt notes that his PCP Dr. Lavone Neri noticed the mass in July/August at a wellness visit and he was subsequently referred to ENT Dr. Fenton Malling at [facility]. The pt denies pain, discomfort, fevers, chills, night sweats, unexpected weight loss, and any other lumps or bumps. He notes that he is not feeling any differently recently as compared to the last 6 months to a year.   The pt also notes that his right lower leg is also larger than his left, and has been this way all of his life. He notes that his ankle swelling is stable and has varicose veins.   The pt notes hammer toe surgery and arthroscopic left knee surgery. He notes that he was previously diagnosed with CKD but has managed this well and notes his creatinine has nearly normalized. He also notes that has hypertension.   The pt notes that he had Prevnar and Pneumovax last year and regularly receives his annual flu vaccine.   Of note prior to the patient's visit today, pt has had a Submandibular gland biopsy completed on 01/28/18 with results revealing concern for a lymphoproliferative process.   The pt also had a 01/15/18 CT Neck which revealed 3.3 x 4.1  x 5 cm solid LEFT submandibular gland mass. Differential diagnosis includes benign mixed tumor or carcinoma. 2. Prominent though not pathologically enlarged LEFT lymph nodes potentially metastatic if tumor is malignant. 3. For above findings, recommend ENT consultation and histopathologic correlation.  Most recent lab results (01/28/18) of CBC is as follows: all values are WNL.  On review of systems, pt reports left jaw mass, stable energy levels, moving his bowels well, stable ankle swelling, and denies fevers, chills, night sweats, painful jaw mass, discomfort at jaw mass, unexpected weight loss, problems swallowing, pain along the spine, itching, back pain, new fatigue, abdominal pains, problems passing urine, and any other symptoms.   On PMHx the pt reports CKD, HTN, left knee arthroscopic knee surgery in 2016. 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 The pt smokes one cigarette every morning. He drinks 2-3 drinks of whiskey most afternoons, 4-6 on the weekends.  On Family Hx the pt reports father with MI at age 20. Denies blood disorders or cancer.  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 The patient's last visit with [place] was on 05/12/18. He is accompanied today by his wife and daughter. The pt reports that he is doing well overall.   The pt reports that he has noticed his left jaw mass decreasing in size. He was able to finish his first Rituxan  infusion, though developed some redness and mild swelling of the left cheek which resolved with steroids and reducing the infusion rate.   The pt denies any remaining skin redness or swelling. He also denies any difficulty breathing or lip swelling. The pt also denies any mouth sores or concerns for infections.   Lab results today (05/25/18) of CBC w/diff and CMP is as follows: all values are WNL except for Glucose at 121, Creatinine at 1.80, GFR at  38.  On review of systems, pt reports reduced size of left parotid mass, stable energy levels, staying well hydrated, and denies lip swelling, difficulty breathing, skin redness, swelling, fevers, chills, night sweats, concerns for infections, mouth sores, abdominal pains, and any other symptoms.   MEDICAL HISTORY:  Past Medical History:  Diagnosis Date  . Hypertension   . Renal disorder    pt says stage 3 kidney dz     SURGICAL HISTORY: Past Surgical History:  Procedure Laterality Date  . HAMMER TOE SURGERY    . KNEE SURGERY      SOCIAL HISTORY: Social History   Socioeconomic History  . Marital status: Married    Spouse name: Not on file  . Number of children: Not on file  . Years of education: Not on file  . Highest education level: Not on file  Occupational History  . Not on file  Social Needs  . Financial resource strain: Not on file  . Food insecurity:    Worry: Not on file    Inability: Not on file  . Transportation needs:    Medical: Not on file    Non-medical: Not on file  Tobacco Use  . Smoking status: Never Smoker  . Smokeless tobacco: Never Used  Substance and Sexual Activity  . Alcohol use: Yes    Comment: daily   . Drug use: No  . Sexual activity: Not on file  Lifestyle  . Physical activity:    Days per week: Not on file    Minutes per session: Not on file  . Stress: Not on file  Relationships  . Social connections:    Talks on phone: Not on file    Gets together: Not on file    Attends religious service: Not on file    Active member of club or organization: Not on file    Attends meetings of clubs or organizations: Not on file    Relationship status: Not on file  . Intimate partner violence:    Fear of current or ex partner: Not on file    Emotionally abused: Not on file    Physically abused: Not on file    Forced sexual activity: Not on file  Other Topics Concern  . Not on file  Social History Narrative  . Not on file    FAMILY  HISTORY: History reviewed. No pertinent family history.  ALLERGIES:  has No Known Allergies.  MEDICATIONS:  Current Outpatient Medications  Medication Sig Dispense Refill  . acetaminophen (TYLENOL) 500 MG tablet Take 1,000 mg by mouth every 6 (six) hours as needed for moderate pain or headache.    . Artificial Tear Solution (GENTEAL TEARS OP) Place 1 drop into both eyes at bedtime.    Beryl Meager Tar Extract [id number] PSORIASIS MEDICATED EX) Apply 1 application topically daily.    Marland Kitchen dexamethasone (DECADRON) 4 MG tablet Take 2 tablets (8 mg total) by mouth once as needed for up to 1 dose (8mg  once with lunch on day before each Rituxan  dose). 10 tablet 0  . dicyclomine (BENTYL) 20 MG tablet Take 1 tablet (20 mg total) by mouth 3 (three) times daily before meals. As needed for abdominal cramping (Patient not taking: Reported on 05/25/2018) 20 tablet 0  . furosemide (LASIX) 40 MG tablet Take 40 mg by mouth daily.     . metoprolol succinate (TOPROL-XL) 50 MG 24 hr tablet Take 50 mg by mouth daily. Take with or immediately following a meal.    . Milk Thistle 1000 MG CAPS Take 1 tablet by mouth.    . Multiple Vitamins-Minerals (MULTIVITAMIN PO) Take 1 tablet by mouth daily.    . Omega-3 Fatty Acids (OMEGA 3 PO) Take 1 capsule by mouth 2 (two) times daily.    . ondansetron (ZOFRAN ODT) 4 MG disintegrating tablet Take 1 tablet (4 mg total) by mouth every 8 (eight) hours as needed for nausea or vomiting. (Patient not taking: Reported on 01/20/2018) 20 tablet 0  . pravastatin (PRAVACHOL) 20 MG tablet Take 20 mg by mouth at bedtime.     . Turmeric 500 MG CAPS Take 500 mg by mouth daily.     No current facility-administered medications for this visit.     REVIEW OF SYSTEMS:    A 10+ POINT REVIEW OF SYSTEMS WAS OBTAINED including neurology, dermatology, psychiatry, cardiac, respiratory, lymph, extremities, GI, GU, Musculoskeletal, constitutional, breasts, reproductive, HEENT.  All pertinent positives are noted in  the HPI.  All others are negative.   PHYSICAL EXAMINATION: ECOG PERFORMANCE STATUS: 1 - Symptomatic but completely ambulatory  Vitals:   05/25/18 1141  BP: (!) 147/94  Pulse: 100  Resp: 18  Temp: 98.3 F (36.8 C)  SpO2: 100%   Filed Weights   05/25/18 1141  Weight: 268 lb 14.4 oz (122 kg)   .Body mass index is 38.58 kg/m.  GENERAL:alert, in no acute distress and comfortable SKIN: no acute rashes, no significant lesions, some venous stasis skin changes on lower right extremity  EYES: conjunctiva are pink and non-injected, sclera anicteric OROPHARYNX: MMM, no exudates, no oropharyngeal erythema or ulceration NECK: supple, no JVD, palpable movable mass at left submandibular gland LYMPH:  no palpable lymphadenopathy in the cervical, axillary or inguinal regions LUNGS: clear to auscultation b/l with normal respiratory effort HEART: regular rate & rhythm ABDOMEN:  normoactive bowel sounds , non tender, not distended. No palpable hepatosplenomegaly.  Extremity: 1+ pedal edema, venous stasis skin changes on LRE PSYCH: alert & oriented x 3 with fluent speech NEURO: no focal motor/sensory deficits   LABORATORY DATA:  I have reviewed the data as listed  . CBC Latest Ref Rng & Units 05/25/2018 05/19/2018 03/24/2018  WBC 4.0 - 10.5 K/uL 7.2 5.6 6.1  Hemoglobin 13.0 - 17.0 g/dL 16.6 15.6 15.1  Hematocrit 39.0 - 52.0 % 49.3 45.3 44.7  Platelets 150 - 400 K/uL 217 173 203    . CMP Latest Ref Rng & Units 05/25/2018 05/19/2018 03/24/2018  Glucose 70 - 99 mg/dL 121(H) 93 99  BUN 8 - 23 mg/dL 18 19 16   Creatinine 0.61 - 1.24 mg/dL 1.80(H) 1.45(H) 1.43(H)  Sodium 135 - 145 mmol/L 140 140 141  Potassium 3.5 - 5.1 mmol/L 4.0 3.7 4.0  Chloride 98 - 111 mmol/L 101 104 103  CO2 22 - 32 mmol/L 28 26 29   Calcium 8.9 - 10.3 mg/dL 9.7 9.8 10.0  Total Protein 6.5 - 8.1 g/dL 7.4 7.4 7.9  Total Bilirubin 0.3 - 1.2 mg/dL 1.2 0.8 0.7  Alkaline Phos 38 - 126  U/L 58 59 55  AST 15 - 41 U/L 22 19 24     ALT 0 - 44 U/L 24 18 20    Component     Latest Ref Rng & Units 03/24/2018  Hepatitis B Surface Ag     Negative Negative  Hep B Core Ab, Tot     Negative Negative  HIV Screen 4th Generation wRfx     Non Reactive Non Reactive  HCV Ab     0.0 - 0.9 s/co ratio <0.1  LDH     98 - 192 U/L 139   . Lab Results  Component Value Date   LDH 139 03/24/2018    01/28/18 Submandibular gland biopsy:    RADIOGRAPHIC STUDIES: I have personally reviewed the radiological images as listed and agreed with the findings in the report. No results found.  ASSESSMENT & PLAN:   67 y.o. male with  1. 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 Additional nonenlarged lymph nodes in the left neck shows low level FDG accumulation, suspicious for tumor involvement. 2. Tiny right groin lymph node shows low level FDG uptake, indeterminate. Otherwise, no evidence for hypermetabolic disease in the chest, abdomen, or pelvis. 3. Hepatic steatosis. 4. Cholelithiasis. 5. Colonic diverticulosis without diverticulitis.   04/24/18 Left cervical lymph node incisional biopsy revealed Follicular lymphoma, Grade 1-2 with a 10% proliferation rate   PLAN: -Discussed pt labwork today, 05/25/18; blood counts are normal, Creatinine slightly higher at 1.80 -The pt has no prohibitive toxicities from continuing his second infusion of Rituxan (Not rapid protocol) at this time. -Limit Rituxan infusion rate to tolerated rate with previous infusion -Adding Famotidine and Singulair to pre-medications   -The pt is up to date with  his Prevnar and Pneumovax vaccines  -Will order Dexamethasone for the patient to take at home the day before future Rituxan infusions -Recommended that the pt continue to eat well, drink at least 48-64 oz of water each day, and walk 20-30 minutes each day.  -Recommended that the pt use graded sports compression socks to prevent/help with venous stasis dermatitis  -Previously discussed that the PET/CT findings are reassuring but not fully diagnostic for completely ruling out bone marrow involvement however as the pt doesn't wish to pursue radiation, the BM Bx is not necessary. As patient wishes to not pursue radiation, but prefers treatment over watchful observation -Will repeat imaging in 4-6 weeks following 4th dose of Rituxan -Will see the pt back in 2 weeks    F/u for weekly Rituxan with labs x 3 weeks as scheduled RTC with Dr [name] in 2 weeks with 4th dose of Rituxan   All of the patients questions were answered with apparent satisfaction. The patient knows to call the clinic with any problems, questions or concerns.  The total time spent in the appt was 25 minutes and more than 50% was on counseling and direct patient cares.     Sullivan Lone MD [place] AAHIVMS [facility] Hematology/Oncology Physician [facility]  (Office):       [phone] (Work cell):  [phone] (Fax):  [phone]  05/25/2018 12:42 PM  I, Baldwin [place], am acting as a scribe for Dr. Sullivan Lone.   .I have reviewed the above documentation for accuracy and completeness, and I agree with the above. Brunetta Genera MD

## 2018-05-25 ENCOUNTER — Inpatient Hospital Stay: Payer: Medicare HMO | Attending: Hematology

## 2018-05-25 ENCOUNTER — Encounter: Payer: Self-pay | Admitting: Hematology

## 2018-05-25 ENCOUNTER — Telehealth: Payer: Self-pay | Admitting: *Deleted

## 2018-05-25 ENCOUNTER — Inpatient Hospital Stay (HOSPITAL_BASED_OUTPATIENT_CLINIC_OR_DEPARTMENT_OTHER): Payer: Medicare HMO | Admitting: Hematology

## 2018-05-25 VITALS — BP 147/94 | HR 100 | Temp 98.3°F | Resp 18 | Ht 70.0 in | Wt 268.9 lb

## 2018-05-25 DIAGNOSIS — Z79899 Other long term (current) drug therapy: Secondary | ICD-10-CM | POA: Diagnosis not present

## 2018-05-25 DIAGNOSIS — N183 Chronic kidney disease, stage 3 (moderate): Secondary | ICD-10-CM

## 2018-05-25 DIAGNOSIS — K760 Fatty (change of) liver, not elsewhere classified: Secondary | ICD-10-CM

## 2018-05-25 DIAGNOSIS — I129 Hypertensive chronic kidney disease with stage 1 through stage 4 chronic kidney disease, or unspecified chronic kidney disease: Secondary | ICD-10-CM | POA: Insufficient documentation

## 2018-05-25 DIAGNOSIS — C8211 Follicular lymphoma grade II, lymph nodes of head, face, and neck: Secondary | ICD-10-CM | POA: Insufficient documentation

## 2018-05-25 DIAGNOSIS — K5730 Diverticulosis of large intestine without perforation or abscess without bleeding: Secondary | ICD-10-CM

## 2018-05-25 DIAGNOSIS — F17210 Nicotine dependence, cigarettes, uncomplicated: Secondary | ICD-10-CM

## 2018-05-25 DIAGNOSIS — M19071 Primary osteoarthritis, right ankle and foot: Secondary | ICD-10-CM

## 2018-05-25 DIAGNOSIS — K8020 Calculus of gallbladder without cholecystitis without obstruction: Secondary | ICD-10-CM | POA: Insufficient documentation

## 2018-05-25 DIAGNOSIS — T8090XA Unspecified complication following infusion and therapeutic injection, initial encounter: Secondary | ICD-10-CM

## 2018-05-25 DIAGNOSIS — Z5112 Encounter for antineoplastic immunotherapy: Secondary | ICD-10-CM

## 2018-05-25 LAB — CBC WITH DIFFERENTIAL/PLATELET
ABS IMMATURE GRANULOCYTES: 0.03 10*3/uL (ref 0.00–0.07)
BASOS ABS: 0 10*3/uL (ref 0.0–0.1)
Basophils Relative: 1 %
EOS ABS: 0.1 10*3/uL (ref 0.0–0.5)
EOS PCT: 2 %
HCT: 49.3 % (ref 39.0–52.0)
HEMOGLOBIN: 16.6 g/dL (ref 13.0–17.0)
Immature Granulocytes: 0 %
Lymphocytes Relative: 22 %
Lymphs Abs: 1.6 10*3/uL (ref 0.7–4.0)
MCH: 32.3 pg (ref 26.0–34.0)
MCHC: 33.7 g/dL (ref 30.0–36.0)
MCV: 95.9 fL (ref 80.0–100.0)
Monocytes Absolute: 0.8 10*3/uL (ref 0.1–1.0)
Monocytes Relative: 11 %
NEUTROS PCT: 64 %
NRBC: 0 % (ref 0.0–0.2)
Neutro Abs: 4.6 10*3/uL (ref 1.7–7.7)
PLATELETS: 217 10*3/uL (ref 150–400)
RBC: 5.14 MIL/uL (ref 4.22–5.81)
RDW: 13 % (ref 11.5–15.5)
WBC: 7.2 10*3/uL (ref 4.0–10.5)

## 2018-05-25 LAB — CMP (CANCER CENTER ONLY)
ALK PHOS: 58 U/L (ref 38–126)
ALT: 24 U/L (ref 0–44)
ANION GAP: 11 (ref 5–15)
AST: 22 U/L (ref 15–41)
Albumin: 4.1 g/dL (ref 3.5–5.0)
BUN: 18 mg/dL (ref 8–23)
CALCIUM: 9.7 mg/dL (ref 8.9–10.3)
CO2: 28 mmol/L (ref 22–32)
CREATININE: 1.8 mg/dL — AB (ref 0.61–1.24)
Chloride: 101 mmol/L (ref 98–111)
GFR, EST AFRICAN AMERICAN: 44 mL/min — AB (ref >60)
GFR, EST NON AFRICAN AMERICAN: 38 mL/min — AB (ref >60)
Glucose, Bld: 121 mg/dL — ABNORMAL HIGH (ref 70–99)
Potassium: 4 mmol/L (ref 3.5–5.1)
SODIUM: 140 mmol/L (ref 135–145)
Total Bilirubin: 1.2 mg/dL (ref 0.3–1.2)
Total Protein: 7.4 g/dL (ref 6.5–8.1)

## 2018-05-25 MED ORDER — DEXAMETHASONE 4 MG PO TABS: 8.0000 mg | ORAL_TABLET | Freq: Once | ORAL | 0 refills | Status: DC | PRN

## 2018-05-25 NOTE — Patient Instructions (Signed)
Thank you for choosing [facility] to provide your oncology and hematology care.  To afford each patient quality time with our providers, please arrive 30 minutes before your scheduled appointment time.  If you arrive late for your appointment, you may be asked to reschedule.  We strive to give you quality time with our providers, and arriving late affects you and other patients whose appointments are after yours.    If you are a no show for multiple scheduled visits, you may be dismissed from the clinic at the providers discretion.     Again, thank you for choosing [facility], our hope is that these requests will decrease the amount of time that you wait before being seen by our physicians.  ______________________________________________________________________   Should you have questions after your visit to the [facility], please contact our office at [phone] between the hours of 8:30 and 4:30 p.m.    Voicemails left after 4:30p.m will not be returned until the following business day.     For prescription refill requests, please have your pharmacy contact us directly.  Please also try to allow 48 hours for prescription requests.     Please contact the scheduling department for questions regarding scheduling.  For scheduling of procedures such as PET scans, CT scans, MRI, Ultrasound, etc please contact central scheduling at [phone].     Resources For Cancer Patients and Caregivers:    Oncolink.org:  A wonderful resource for patients and healthcare providers for information regarding your disease, ways to tract your treatment, what to expect, etc.      American Cancer Society:  800-227-2345  Can help patients locate various types of support and financial assistance   Cancer Care: 1-800-813-HOPE (4673) Provides financial assistance, online support groups, medication/co-pay assistance.     [place] DSS:  336-641-3447 Where to apply  for food stamps, Medicaid, and utility assistance   Medicare Rights Center: 800-333-4114 Helps people with Medicare understand their rights and benefits, navigate the Medicare system, and secure the quality healthcare they deserve   SCAT: 336-333-6589 [place] [employer]'s shared-ride transportation service for eligible riders who have a disability that prevents them from riding the fixed route bus.     For additional information on assistance programs please contact our social worker:   Jeffery Wilson:  [phone]  

## 2018-05-25 NOTE — Telephone Encounter (Signed)
Patient spouse called to clarify how to take Dexamethasone. Instructed to take 2 tablets (50mf total) today as soon as prescription is picked up. Next week, take 2 tablets at lunch on the day before treatment. Spouse verbalized understanding of medication instructions.

## 2018-05-26 ENCOUNTER — Inpatient Hospital Stay: Payer: Medicare HMO

## 2018-05-26 VITALS — BP 130/84 | HR 63 | Temp 98.4°F | Resp 17

## 2018-05-26 DIAGNOSIS — Z5112 Encounter for antineoplastic immunotherapy: Secondary | ICD-10-CM | POA: Diagnosis not present

## 2018-05-26 DIAGNOSIS — C8211 Follicular lymphoma grade II, lymph nodes of head, face, and neck: Secondary | ICD-10-CM

## 2018-05-26 DIAGNOSIS — Z7189 Other specified counseling: Secondary | ICD-10-CM

## 2018-05-26 MED ORDER — DIPHENHYDRAMINE HCL 25 MG PO CAPS
ORAL_CAPSULE | ORAL | Status: AC
  Filled 2018-05-26: qty 2

## 2018-05-26 MED ORDER — ACETAMINOPHEN 325 MG PO TABS
650.0000 mg | ORAL_TABLET | Freq: Once | ORAL | Status: AC
  Administered 2018-05-26: 650 mg via ORAL

## 2018-05-26 MED ORDER — MONTELUKAST SODIUM 10 MG PO TABS
ORAL_TABLET | ORAL | Status: AC
  Filled 2018-05-26: qty 1

## 2018-05-26 MED ORDER — SODIUM CHLORIDE 0.9 % IV SOLN
375.0000 mg/m2 | Freq: Once | INTRAVENOUS | Status: AC
  Administered 2018-05-26: 900 mg via INTRAVENOUS
  Filled 2018-05-26: qty 50

## 2018-05-26 MED ORDER — MONTELUKAST SODIUM 10 MG PO TABS
10.0000 mg | ORAL_TABLET | Freq: Once | ORAL | Status: AC
  Administered 2018-05-26: 10 mg via ORAL

## 2018-05-26 MED ORDER — DEXAMETHASONE SODIUM PHOSPHATE 10 MG/ML IJ SOLN: 12.0000 mg | Freq: Once | INTRAMUSCULAR | Status: DC

## 2018-05-26 MED ORDER — FAMOTIDINE IN NACL 20-0.9 MG/50ML-% IV SOLN
INTRAVENOUS | Status: AC
  Filled 2018-05-26: qty 50

## 2018-05-26 MED ORDER — FAMOTIDINE IN NACL 20-0.9 MG/50ML-% IV SOLN
20.0000 mg | Freq: Once | INTRAVENOUS | Status: AC
  Administered 2018-05-26: 20 mg via INTRAVENOUS

## 2018-05-26 MED ORDER — SODIUM CHLORIDE 0.9 % IV SOLN
12.0000 mg | Freq: Once | INTRAVENOUS | Status: AC
  Administered 2018-05-26: 12 mg via INTRAVENOUS
  Filled 2018-05-26: qty 1.2

## 2018-05-26 MED ORDER — DIPHENHYDRAMINE HCL 25 MG PO CAPS
50.0000 mg | ORAL_CAPSULE | Freq: Once | ORAL | Status: AC
  Administered 2018-05-26: 50 mg via ORAL

## 2018-05-26 MED ORDER — ACETAMINOPHEN 325 MG PO TABS
ORAL_TABLET | ORAL | Status: AC
  Filled 2018-05-26: qty 2

## 2018-05-26 MED ORDER — SODIUM CHLORIDE 0.9 % IV SOLN
Freq: Once | INTRAVENOUS | Status: AC
  Administered 2018-05-26: 09:00:00 via INTRAVENOUS
  Filled 2018-05-26: qty 250

## 2018-05-26 NOTE — Progress Notes (Signed)
Okay to treat with Creatnine of 1.80 per Dr. [name].

## 2018-05-26 NOTE — Patient Instructions (Signed)
[facility] Discharge Instructions for Patients Receiving Chemotherapy  Today you received the following chemotherapy agents:  Rituxan.  To help prevent nausea and vomiting after your treatment, we encourage you to take your nausea medication as directed.   If you develop nausea and vomiting that is not controlled by your nausea medication, call the clinic.   BELOW ARE SYMPTOMS THAT SHOULD BE REPORTED IMMEDIATELY:  *FEVER GREATER THAN 100.5 F  *CHILLS WITH OR WITHOUT FEVER  NAUSEA AND VOMITING THAT IS NOT CONTROLLED WITH YOUR NAUSEA MEDICATION  *UNUSUAL SHORTNESS OF BREATH  *UNUSUAL BRUISING OR BLEEDING  TENDERNESS IN MOUTH AND THROAT WITH OR WITHOUT PRESENCE OF ULCERS  *URINARY PROBLEMS  *BOWEL PROBLEMS  UNUSUAL RASH Items with * indicate a potential emergency and should be followed up as soon as possible.  Feel free to call the clinic should you have any questions or concerns. The clinic phone number is [phone].  Please show the CHEMO ALERT CARD at check-in to the Emergency Department and triage nurse.   

## 2018-06-02 ENCOUNTER — Inpatient Hospital Stay: Payer: Medicare HMO

## 2018-06-02 VITALS — BP 124/80 | HR 59 | Temp 98.4°F | Resp 16

## 2018-06-02 DIAGNOSIS — C8211 Follicular lymphoma grade II, lymph nodes of head, face, and neck: Secondary | ICD-10-CM

## 2018-06-02 DIAGNOSIS — Z7189 Other specified counseling: Secondary | ICD-10-CM

## 2018-06-02 DIAGNOSIS — Z5112 Encounter for antineoplastic immunotherapy: Secondary | ICD-10-CM | POA: Diagnosis not present

## 2018-06-02 LAB — CBC WITH DIFFERENTIAL/PLATELET
Abs Immature Granulocytes: 0.05 10*3/uL (ref 0.00–0.07)
BASOS PCT: 0 %
Basophils Absolute: 0 10*3/uL (ref 0.0–0.1)
Eosinophils Absolute: 0 10*3/uL (ref 0.0–0.5)
Eosinophils Relative: 0 %
HCT: 45.9 % (ref 39.0–52.0)
Hemoglobin: 15.6 g/dL (ref 13.0–17.0)
IMMATURE GRANULOCYTES: 0 %
Lymphocytes Relative: 7 %
Lymphs Abs: 0.8 10*3/uL (ref 0.7–4.0)
MCH: 32.2 pg (ref 26.0–34.0)
MCHC: 34 g/dL (ref 30.0–36.0)
MCV: 94.8 fL (ref 80.0–100.0)
Monocytes Absolute: 0.7 10*3/uL (ref 0.1–1.0)
Monocytes Relative: 6 %
NEUTROS ABS: 9.8 10*3/uL — AB (ref 1.7–7.7)
Neutrophils Relative %: 87 %
Platelets: 223 10*3/uL (ref 150–400)
RBC: 4.84 MIL/uL (ref 4.22–5.81)
RDW: 12.8 % (ref 11.5–15.5)
WBC: 11.3 10*3/uL — ABNORMAL HIGH (ref 4.0–10.5)
nRBC: 0 % (ref 0.0–0.2)

## 2018-06-02 LAB — CMP (CANCER CENTER ONLY)
ALT: 24 U/L (ref 0–44)
ANION GAP: 14 (ref 5–15)
AST: 14 U/L — ABNORMAL LOW (ref 15–41)
Albumin: 4 g/dL (ref 3.5–5.0)
Alkaline Phosphatase: 56 U/L (ref 38–126)
BILIRUBIN TOTAL: 0.8 mg/dL (ref 0.3–1.2)
BUN: 21 mg/dL (ref 8–23)
CO2: 22 mmol/L (ref 22–32)
Calcium: 10 mg/dL (ref 8.9–10.3)
Chloride: 102 mmol/L (ref 98–111)
Creatinine: 1.54 mg/dL — ABNORMAL HIGH (ref 0.61–1.24)
GFR, Est AFR Am: 53 mL/min — ABNORMAL LOW (ref >60)
GFR, Estimated: 46 mL/min — ABNORMAL LOW (ref >60)
Glucose, Bld: 125 mg/dL — ABNORMAL HIGH (ref 70–99)
Potassium: 4.4 mmol/L (ref 3.5–5.1)
Sodium: 138 mmol/L (ref 135–145)
Total Protein: 7.2 g/dL (ref 6.5–8.1)

## 2018-06-02 MED ORDER — MONTELUKAST SODIUM 10 MG PO TABS
10.0000 mg | ORAL_TABLET | Freq: Once | ORAL | Status: AC
  Administered 2018-06-02: 10 mg via ORAL

## 2018-06-02 MED ORDER — ACETAMINOPHEN 325 MG PO TABS
ORAL_TABLET | ORAL | Status: AC
  Filled 2018-06-02: qty 2

## 2018-06-02 MED ORDER — MONTELUKAST SODIUM 10 MG PO TABS
ORAL_TABLET | ORAL | Status: AC
  Filled 2018-06-02: qty 1

## 2018-06-02 MED ORDER — SODIUM CHLORIDE 0.9 % IV SOLN
12.0000 mg | Freq: Once | INTRAVENOUS | Status: AC
  Administered 2018-06-02: 12 mg via INTRAVENOUS
  Filled 2018-06-02: qty 1.2

## 2018-06-02 MED ORDER — DIPHENHYDRAMINE HCL 25 MG PO CAPS
50.0000 mg | ORAL_CAPSULE | Freq: Once | ORAL | Status: AC
  Administered 2018-06-02: 50 mg via ORAL

## 2018-06-02 MED ORDER — DIPHENHYDRAMINE HCL 25 MG PO CAPS
ORAL_CAPSULE | ORAL | Status: AC
  Filled 2018-06-02: qty 2

## 2018-06-02 MED ORDER — ACETAMINOPHEN 325 MG PO TABS
650.0000 mg | ORAL_TABLET | Freq: Once | ORAL | Status: AC
  Administered 2018-06-02: 650 mg via ORAL

## 2018-06-02 MED ORDER — FAMOTIDINE IN NACL 20-0.9 MG/50ML-% IV SOLN
INTRAVENOUS | Status: AC
  Filled 2018-06-02: qty 50

## 2018-06-02 MED ORDER — FAMOTIDINE IN NACL 20-0.9 MG/50ML-% IV SOLN
20.0000 mg | Freq: Once | INTRAVENOUS | Status: AC
  Administered 2018-06-02: 20 mg via INTRAVENOUS

## 2018-06-02 MED ORDER — SODIUM CHLORIDE 0.9 % IV SOLN
Freq: Once | INTRAVENOUS | Status: AC
  Administered 2018-06-02: 09:00:00 via INTRAVENOUS
  Filled 2018-06-02: qty 250

## 2018-06-02 MED ORDER — SODIUM CHLORIDE 0.9 % IV SOLN
375.0000 mg/m2 | Freq: Once | INTRAVENOUS | Status: AC
  Administered 2018-06-02: 900 mg via INTRAVENOUS
  Filled 2018-06-02: qty 50

## 2018-06-02 NOTE — Progress Notes (Signed)
Dr. [name] gave ok to run Rituxan slightly faster today but not completely "rapid." I s/w Chelsea, RN and she will run Rituxan at 100 mg/hr for initial rate and then increase by 150 mg/hr every 30 min (instead of 100 mg/hr) as tol. Kennith Center, Pharm.D., CPP 06/02/2018@9 :28 AM

## 2018-06-02 NOTE — Patient Instructions (Signed)
[facility] Discharge Instructions for Patients Receiving Chemotherapy  Today you received the following chemotherapy agents:  Rituxan.  To help prevent nausea and vomiting after your treatment, we encourage you to take your nausea medication as directed.   If you develop nausea and vomiting that is not controlled by your nausea medication, call the clinic.   BELOW ARE SYMPTOMS THAT SHOULD BE REPORTED IMMEDIATELY:  *FEVER GREATER THAN 100.5 F  *CHILLS WITH OR WITHOUT FEVER  NAUSEA AND VOMITING THAT IS NOT CONTROLLED WITH YOUR NAUSEA MEDICATION  *UNUSUAL SHORTNESS OF BREATH  *UNUSUAL BRUISING OR BLEEDING  TENDERNESS IN MOUTH AND THROAT WITH OR WITHOUT PRESENCE OF ULCERS  *URINARY PROBLEMS  *BOWEL PROBLEMS  UNUSUAL RASH Items with * indicate a potential emergency and should be followed up as soon as possible.  Feel free to call the clinic should you have any questions or concerns. The clinic phone number is [phone].  Please show the CHEMO ALERT CARD at check-in to the Emergency Department and triage nurse.   

## 2018-06-08 NOTE — Progress Notes (Signed)
HEMATOLOGY/ONCOLOGY CLINIC NOTE  Date of Service: 06/09/2018  Patient Care Team: Helane Rima, MD as PCP - General (Family Medicine)  CHIEF COMPLAINTS/PURPOSE OF CONSULTATION:  Concern for a Lymphoproliferative process  HISTORY OF PRESENTING ILLNESS:   Jeffery Wilson is a wonderful 67 y.o. male who has been referred to [place] by Dr. Helane Rima for evaluation and management of his concern for a Lymphoproliferative process. He is accompanied today by his daughter and wife. The pt reports that he is doing well overall.   The pt reports that he first noticed the left jaw mass two years ago which has slowly grown. 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 The pt notes that his PCP Dr. Lavone Neri noticed the mass in July/August at a wellness visit and he was subsequently referred to ENT Dr. Fenton Malling at [facility]. The pt denies pain, discomfort, fevers, chills, night sweats, unexpected weight loss, and any other lumps or bumps. He notes that he is not feeling any differently recently as compared to the last 6 months to a year.   The pt also notes that his right lower leg is also larger than his left, and has been this way all of his life. He notes that his ankle swelling is stable and has varicose veins.   The pt notes hammer toe surgery and arthroscopic left knee surgery. He notes that he was previously diagnosed with CKD but has managed this well and notes his creatinine has nearly normalized. He also notes that has hypertension.   The pt notes that he had Prevnar and Pneumovax last year and regularly receives his annual flu vaccine.   Of note prior to the patient's visit today, pt has had a Submandibular gland biopsy completed on 01/28/18 with results revealing concern for a lymphoproliferative process.   The pt also had a 01/15/18 CT Neck which revealed 3.3 x 4.1  x 5 cm solid LEFT submandibular gland mass. Differential diagnosis includes benign mixed tumor or carcinoma. 2. Prominent though not pathologically enlarged LEFT lymph nodes potentially metastatic if tumor is malignant. 3. For above findings, recommend ENT consultation and histopathologic correlation.  Most recent lab results (01/28/18) of CBC is as follows: all values are WNL.  On review of systems, pt reports left jaw mass, stable energy levels, moving his bowels well, stable ankle swelling, and denies fevers, chills, night sweats, painful jaw mass, discomfort at jaw mass, unexpected weight loss, problems swallowing, pain along the spine, itching, back pain, new fatigue, abdominal pains, problems passing urine, and any other symptoms.   On PMHx the pt reports CKD, HTN, left knee arthroscopic knee surgery in 2016. 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 The pt smokes one cigarette every morning. He drinks 2-3 drinks of whiskey most afternoons, 4-6 on the weekends.  On Family Hx the pt reports father with MI at age 28. Denies blood disorders or cancer.  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 The patient's last visit with [place] was on 05/25/18. He is accompanied today by his wife. The pt reports that he is doing well overall.   The pt reports that he has not developed any new concerns in the short interim. He notes that he has not had difficulty sleeping  while on Prednisone, and he has also increased his hydration intentionally. He has continued eating well also.   The pt also notes that his left sided submandibular enlargement continues ton improve and he denies noticing any new lumps or bumps.   Lab results today (06/09/18) of CBC w/diff and CMP is as follows: all values are WNL except for ANC at 8.6k, Lymphs abs at 600, CO2 at 21, Glucose at 152, Creatinine at 1.46 AST at 14, GFR at 49.  On  review of systems, pt reports staying hydrated, good energy levels, eating well, and denies concerns for infections, fevers, chills, night sweats, and any other symptoms.    MEDICAL HISTORY:  Past Medical History:  Diagnosis Date  . Hypertension   . Renal disorder    pt says stage 3 kidney dz     SURGICAL HISTORY: Past Surgical History:  Procedure Laterality Date  . HAMMER TOE SURGERY    . KNEE SURGERY      SOCIAL HISTORY: Social History   Socioeconomic History  . Marital status: Married    Spouse name: Not on file  . Number of children: Not on file  . Years of education: Not on file  . Highest education level: Not on file  Occupational History  . Not on file  Social Needs  . Financial resource strain: Not on file  . Food insecurity:    Worry: Not on file    Inability: Not on file  . Transportation needs:    Medical: Not on file    Non-medical: Not on file  Tobacco Use  . Smoking status: Never Smoker  . Smokeless tobacco: Never Used  Substance and Sexual Activity  . Alcohol use: Yes    Comment: daily   . Drug use: No  . Sexual activity: Not on file  Lifestyle  . Physical activity:    Days per week: Not on file    Minutes per session: Not on file  . Stress: Not on file  Relationships  . Social connections:    Talks on phone: Not on file    Gets together: Not on file    Attends religious service: Not on file    Active member of club or organization: Not on file    Attends meetings of clubs or organizations: Not on file    Relationship status: Not on file  . Intimate partner violence:    Fear of current or ex partner: Not on file    Emotionally abused: Not on file    Physically abused: Not on file    Forced sexual activity: Not on file  Other Topics Concern  . Not on file  Social History Narrative  . Not on file    FAMILY HISTORY: No family history on file.  ALLERGIES:  has No Known Allergies.  MEDICATIONS:  Current Outpatient Medications    Medication Sig Dispense Refill  . acetaminophen (TYLENOL) 500 MG tablet Take 1,000 mg by mouth every 6 (six) hours as needed for moderate pain or headache.    . Artificial Tear Solution (GENTEAL TEARS OP) Place 1 drop into both eyes at bedtime.    Beryl Meager Tar Extract [phone] PSORIASIS MEDICATED EX) Apply 1 application topically daily.    Marland Kitchen dexamethasone (DECADRON) 4 MG tablet Take 2 tablets (8 mg total) by mouth once as needed for up to 1 dose (8mg  once with lunch on day before each Rituxan dose). 10 tablet 0  . dicyclomine (BENTYL) 20 MG tablet Take 1  tablet (20 mg total) by mouth 3 (three) times daily before meals. As needed for abdominal cramping (Patient not taking: Reported on 05/25/2018) 20 tablet 0  . furosemide (LASIX) 40 MG tablet Take 40 mg by mouth daily.     . metoprolol succinate (TOPROL-XL) 50 MG 24 hr tablet Take 50 mg by mouth daily. Take with or immediately following a meal.    . Milk Thistle 1000 MG CAPS Take 1 tablet by mouth.    . Multiple Vitamins-Minerals (MULTIVITAMIN PO) Take 1 tablet by mouth daily.    . Omega-3 Fatty Acids (OMEGA 3 PO) Take 1 capsule by mouth 2 (two) times daily.    . ondansetron (ZOFRAN ODT) 4 MG disintegrating tablet Take 1 tablet (4 mg total) by mouth every 8 (eight) hours as needed for nausea or vomiting. (Patient not taking: Reported on 01/20/2018) 20 tablet 0  . pravastatin (PRAVACHOL) 20 MG tablet Take 20 mg by mouth at bedtime.     . Turmeric 500 MG CAPS Take 500 mg by mouth daily.     No current facility-administered medications for this visit.    Facility-Administered Medications Ordered in Other Visits  Medication Dose Route Frequency Provider Last Rate Last Dose  . dexamethasone (DECADRON) 12 mg in sodium chloride 0.9 % 50 mL IVPB  12 mg Intravenous Once Brunetta Genera, MD      . famotidine (PEPCID) IVPB 20 mg premix  20 mg Intravenous Once Brunetta Genera, MD 200 mL/hr at 06/09/18 0852 20 mg at 06/09/18 0852  . riTUXimab (RITUXAN)  900 mg in sodium chloride 0.9 % 250 mL (2.6471 mg/mL) infusion  375 mg/m2 (Treatment Plan Recorded) Intravenous Once Brunetta Genera, MD        REVIEW OF SYSTEMS:    A 10+ POINT REVIEW OF SYSTEMS WAS OBTAINED including neurology, dermatology, psychiatry, cardiac, respiratory, lymph, extremities, GI, GU, Musculoskeletal, constitutional, breasts, reproductive, HEENT.  All pertinent positives are noted in the HPI.  All others are negative.   PHYSICAL EXAMINATION: ECOG PERFORMANCE STATUS: 1 - Symptomatic but completely ambulatory  There were no vitals filed for this visit. There were no vitals filed for this visit. .There is no height or weight on file to calculate BMI.  GENERAL:alert, in no acute distress and comfortable SKIN: no acute rashes, no significant lesions, some venous stasis skin changes on LRE EYES: conjunctiva are pink and non-injected, sclera anicteric OROPHARYNX: MMM, no exudates, no oropharyngeal erythema or ulceration NECK: supple, no JVD, improving palpable movable mass at left submandibular gland  LYMPH:  no palpable lymphadenopathy in the cervical, axillary or inguinal regions LUNGS: clear to auscultation b/l with normal respiratory effort HEART: regular rate & rhythm ABDOMEN:  normoactive bowel sounds , non tender, not distended. No palpable hepatosplenomegaly.  Extremity: 1+ pedal edema, venous stasis skin changes on LRE PSYCH: alert & oriented x 3 with fluent speech NEURO: no focal motor/sensory deficits   LABORATORY DATA:  I have reviewed the data as listed  . CBC Latest Ref Rng & Units 06/09/2018 06/02/2018 05/25/2018  WBC 4.0 - 10.5 K/uL 9.8 11.3(H) 7.2  Hemoglobin 13.0 - 17.0 g/dL 15.3 15.6 16.6  Hematocrit 39.0 - 52.0 % 44.3 45.9 49.3  Platelets 150 - 400 K/uL 194 223 217    . CMP Latest Ref Rng & Units 06/09/2018 06/02/2018 05/25/2018  Glucose 70 - 99 mg/dL 152(H) 125(H) 121(H)  BUN 8 - 23 mg/dL 20 21 18   Creatinine 0.61 - 1.24 mg/dL 1.46(H)  1.54(H) 1.80(H)  Sodium 135 - 145 mmol/L 137 138 140  Potassium 3.5 - 5.1 mmol/L 4.3 4.4 4.0  Chloride 98 - 111 mmol/L 103 102 101  CO2 22 - 32 mmol/L 21(L) 22 28  Calcium 8.9 - 10.3 mg/dL 9.6 10.0 9.7  Total Protein 6.5 - 8.1 g/dL 7.0 7.2 7.4  Total Bilirubin 0.3 - 1.2 mg/dL 0.7 0.8 1.2  Alkaline Phos 38 - 126 U/L 55 56 58  AST 15 - 41 U/L 14(L) 14(L) 22  ALT 0 - 44 U/L 24 24 24    Component     Latest Ref Rng & Units 03/24/2018  Hepatitis B Surface Ag     Negative Negative  Hep B Core Ab, Tot     Negative Negative  HIV Screen 4th Generation wRfx     Non Reactive Non Reactive  HCV Ab     0.0 - 0.9 s/co ratio <0.1  LDH     98 - 192 U/L 139   . Lab Results  Component Value Date   LDH 139 03/24/2018    01/28/18 Submandibular gland biopsy:    RADIOGRAPHIC STUDIES: I have personally reviewed the radiological images as listed and agreed with the findings in the report. No results found.  ASSESSMENT & PLAN:   67 y.o. male with  1. 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 Additional nonenlarged lymph nodes in the left neck shows low level FDG accumulation, suspicious for tumor involvement. 2. Tiny right groin lymph node shows low level FDG uptake, indeterminate. Otherwise, no evidence for hypermetabolic disease in the chest, abdomen, or pelvis. 3. Hepatic steatosis. 4. Cholelithiasis. 5. Colonic diverticulosis without diverticulitis.   04/24/18 Left cervical lymph node incisional biopsy revealed Follicular lymphoma, Grade 1-2 with a 10% proliferation rate    PLAN: -Discussed pt labwork today, 06/09/18; ANC decreased to 8.6k, no anemia, normal PLT at 194k, Creatinine improved to 1.46, other blood chemistries are stable -The pt has no prohibitive toxicities from continuing with his fourth and final planned weekly infusion of Rituxan (not rapid protocol) at this time.   -Limit Rituxan infusion rate to tolerated rate with previous infusion -Famotidine and Singulair as pre-medications   -The pt is up to date with his Prevnar and Pneumovax vaccines  -Did order Dexamethasone for the patient to take at home the day before future Rituxan infusions -Recommended that the pt use graded sports compression socks to prevent/help with venous stasis dermatitis  -Previously discussed that the PET/CT findings are reassuring but not fully diagnostic for completely ruling out bone marrow involvement, however as the pt didn't wish to pursue radiation, the BM Bx was not necessary.  -Will repeat PET/CT in 8 weeks to assess treatment response, cannot use IV contrast with CT scan due to CKD, and to consider RT treatment vs watchful observation -Will see the pt back in 2 months    PET/CT in 8 weeks RTC with Dr [name] in 2 months with labs   All of the patients questions were answered with apparent satisfaction. The patient knows to call the clinic with any problems, questions or concerns.  The total time spent in the appt was 25 minutes and more than 50% was on counseling and direct patient cares.    Sullivan Lone MD MS AAHIVMS [facility] Hematology/Oncology Physician [facility]  (Office):       [phone] (Work cell):  [phone] (Fax):           [phone]  06/09/2018 9:06 AM  I, Baldwin Jamaica, am acting as a scribe for Dr. Sullivan Lone.   .I have reviewed the above documentation for accuracy and completeness, and I agree with the above. Brunetta Genera MD

## 2018-06-09 ENCOUNTER — Inpatient Hospital Stay (HOSPITAL_BASED_OUTPATIENT_CLINIC_OR_DEPARTMENT_OTHER): Payer: Medicare HMO | Admitting: Hematology

## 2018-06-09 ENCOUNTER — Inpatient Hospital Stay: Payer: Medicare HMO

## 2018-06-09 ENCOUNTER — Telehealth: Payer: Self-pay | Admitting: Hematology

## 2018-06-09 VITALS — BP 137/80 | HR 61 | Temp 98.0°F | Resp 18

## 2018-06-09 DIAGNOSIS — C8211 Follicular lymphoma grade II, lymph nodes of head, face, and neck: Secondary | ICD-10-CM

## 2018-06-09 DIAGNOSIS — K5730 Diverticulosis of large intestine without perforation or abscess without bleeding: Secondary | ICD-10-CM

## 2018-06-09 DIAGNOSIS — M19071 Primary osteoarthritis, right ankle and foot: Secondary | ICD-10-CM

## 2018-06-09 DIAGNOSIS — Z79899 Other long term (current) drug therapy: Secondary | ICD-10-CM

## 2018-06-09 DIAGNOSIS — Z7189 Other specified counseling: Secondary | ICD-10-CM

## 2018-06-09 DIAGNOSIS — F17210 Nicotine dependence, cigarettes, uncomplicated: Secondary | ICD-10-CM

## 2018-06-09 DIAGNOSIS — K8020 Calculus of gallbladder without cholecystitis without obstruction: Secondary | ICD-10-CM

## 2018-06-09 DIAGNOSIS — Z5112 Encounter for antineoplastic immunotherapy: Secondary | ICD-10-CM

## 2018-06-09 DIAGNOSIS — K760 Fatty (change of) liver, not elsewhere classified: Secondary | ICD-10-CM

## 2018-06-09 DIAGNOSIS — I129 Hypertensive chronic kidney disease with stage 1 through stage 4 chronic kidney disease, or unspecified chronic kidney disease: Secondary | ICD-10-CM

## 2018-06-09 DIAGNOSIS — N183 Chronic kidney disease, stage 3 (moderate): Secondary | ICD-10-CM

## 2018-06-09 LAB — CBC WITH DIFFERENTIAL/PLATELET
Abs Immature Granulocytes: 0.04 10*3/uL (ref 0.00–0.07)
Basophils Absolute: 0 10*3/uL (ref 0.0–0.1)
Basophils Relative: 0 %
Eosinophils Absolute: 0 10*3/uL (ref 0.0–0.5)
Eosinophils Relative: 0 %
HCT: 44.3 % (ref 39.0–52.0)
Hemoglobin: 15.3 g/dL (ref 13.0–17.0)
Immature Granulocytes: 0 %
Lymphocytes Relative: 6 %
Lymphs Abs: 0.6 10*3/uL — ABNORMAL LOW (ref 0.7–4.0)
MCH: 32.6 pg (ref 26.0–34.0)
MCHC: 34.5 g/dL (ref 30.0–36.0)
MCV: 94.5 fL (ref 80.0–100.0)
Monocytes Absolute: 0.5 10*3/uL (ref 0.1–1.0)
Monocytes Relative: 5 %
Neutro Abs: 8.6 10*3/uL — ABNORMAL HIGH (ref 1.7–7.7)
Neutrophils Relative %: 89 %
Platelets: 194 10*3/uL (ref 150–400)
RBC: 4.69 MIL/uL (ref 4.22–5.81)
RDW: 12.7 % (ref 11.5–15.5)
WBC: 9.8 10*3/uL (ref 4.0–10.5)
nRBC: 0 % (ref 0.0–0.2)

## 2018-06-09 LAB — CMP (CANCER CENTER ONLY)
ALT: 24 U/L (ref 0–44)
AST: 14 U/L — AB (ref 15–41)
Albumin: 3.8 g/dL (ref 3.5–5.0)
Alkaline Phosphatase: 55 U/L (ref 38–126)
Anion gap: 13 (ref 5–15)
BUN: 20 mg/dL (ref 8–23)
CO2: 21 mmol/L — ABNORMAL LOW (ref 22–32)
Calcium: 9.6 mg/dL (ref 8.9–10.3)
Chloride: 103 mmol/L (ref 98–111)
Creatinine: 1.46 mg/dL — ABNORMAL HIGH (ref 0.61–1.24)
GFR, Est AFR Am: 56 mL/min — ABNORMAL LOW (ref >60)
GFR, Estimated: 49 mL/min — ABNORMAL LOW (ref >60)
Glucose, Bld: 152 mg/dL — ABNORMAL HIGH (ref 70–99)
Potassium: 4.3 mmol/L (ref 3.5–5.1)
Sodium: 137 mmol/L (ref 135–145)
Total Bilirubin: 0.7 mg/dL (ref 0.3–1.2)
Total Protein: 7 g/dL (ref 6.5–8.1)

## 2018-06-09 MED ORDER — ACETAMINOPHEN 325 MG PO TABS
650.0000 mg | ORAL_TABLET | Freq: Once | ORAL | Status: AC
  Administered 2018-06-09: 650 mg via ORAL

## 2018-06-09 MED ORDER — FAMOTIDINE IN NACL 20-0.9 MG/50ML-% IV SOLN
20.0000 mg | Freq: Once | INTRAVENOUS | Status: AC
  Administered 2018-06-09: 20 mg via INTRAVENOUS

## 2018-06-09 MED ORDER — FAMOTIDINE IN NACL 20-0.9 MG/50ML-% IV SOLN
INTRAVENOUS | Status: AC
  Filled 2018-06-09: qty 50

## 2018-06-09 MED ORDER — DIPHENHYDRAMINE HCL 25 MG PO CAPS
50.0000 mg | ORAL_CAPSULE | Freq: Once | ORAL | Status: AC
  Administered 2018-06-09: 50 mg via ORAL

## 2018-06-09 MED ORDER — DIPHENHYDRAMINE HCL 25 MG PO CAPS
ORAL_CAPSULE | ORAL | Status: AC
  Filled 2018-06-09: qty 2

## 2018-06-09 MED ORDER — ACETAMINOPHEN 325 MG PO TABS
ORAL_TABLET | ORAL | Status: AC
  Filled 2018-06-09: qty 2

## 2018-06-09 MED ORDER — SODIUM CHLORIDE 0.9 % IV SOLN
Freq: Once | INTRAVENOUS | Status: AC
  Administered 2018-06-09: 09:00:00 via INTRAVENOUS
  Filled 2018-06-09: qty 250

## 2018-06-09 MED ORDER — SODIUM CHLORIDE 0.9 % IV SOLN
12.0000 mg | Freq: Once | INTRAVENOUS | Status: AC
  Administered 2018-06-09: 12 mg via INTRAVENOUS
  Filled 2018-06-09: qty 1.2

## 2018-06-09 MED ORDER — MONTELUKAST SODIUM 10 MG PO TABS
10.0000 mg | ORAL_TABLET | Freq: Once | ORAL | Status: AC
  Administered 2018-06-09: 10 mg via ORAL

## 2018-06-09 MED ORDER — MONTELUKAST SODIUM 10 MG PO TABS
ORAL_TABLET | ORAL | Status: AC
  Filled 2018-06-09: qty 1

## 2018-06-09 MED ORDER — SODIUM CHLORIDE 0.9 % IV SOLN
375.0000 mg/m2 | Freq: Once | INTRAVENOUS | Status: AC
  Administered 2018-06-09: 900 mg via INTRAVENOUS
  Filled 2018-06-09: qty 50

## 2018-06-09 NOTE — Telephone Encounter (Signed)
Printed calendar and avs. °

## 2018-06-09 NOTE — Patient Instructions (Signed)
[facility] Discharge Instructions for Patients Receiving Chemotherapy  Today you received the following chemotherapy agents:  Rituxan.  To help prevent nausea and vomiting after your treatment, we encourage you to take your nausea medication as directed.   If you develop nausea and vomiting that is not controlled by your nausea medication, call the clinic.   BELOW ARE SYMPTOMS THAT SHOULD BE REPORTED IMMEDIATELY:  *FEVER GREATER THAN 100.5 F  *CHILLS WITH OR WITHOUT FEVER  NAUSEA AND VOMITING THAT IS NOT CONTROLLED WITH YOUR NAUSEA MEDICATION  *UNUSUAL SHORTNESS OF BREATH  *UNUSUAL BRUISING OR BLEEDING  TENDERNESS IN MOUTH AND THROAT WITH OR WITHOUT PRESENCE OF ULCERS  *URINARY PROBLEMS  *BOWEL PROBLEMS  UNUSUAL RASH Items with * indicate a potential emergency and should be followed up as soon as possible.  Feel free to call the clinic should you have any questions or concerns. The clinic phone number is [phone].  Please show the CHEMO ALERT CARD at check-in to the Emergency Department and triage nurse.   

## 2018-08-04 ENCOUNTER — Encounter (HOSPITAL_COMMUNITY)
Admission: RE | Admit: 2018-08-04 | Discharge: 2018-08-04 | Disposition: A | Discharge status: Home / Self Care | Payer: Medicare HMO | Source: Ambulatory Visit | Attending: Hematology | Admitting: Hematology

## 2018-08-04 DIAGNOSIS — C8211 Follicular lymphoma grade II, lymph nodes of head, face, and neck: Secondary | ICD-10-CM | POA: Insufficient documentation

## 2018-08-04 LAB — GLUCOSE, CAPILLARY: Glucose-Capillary: 103 mg/dL — ABNORMAL HIGH (ref 70–99)

## 2018-08-04 MED ORDER — FLUDEOXYGLUCOSE F - 18 (FDG) INJECTION
13.7000 | Freq: Once | INTRAVENOUS | Status: AC
  Administered 2018-08-04: 13.7 via INTRAVENOUS

## 2018-08-10 NOTE — Progress Notes (Signed)
HEMATOLOGY/ONCOLOGY CLINIC NOTE  Date of Service: 08/11/2018  Patient Care Team: Helane Rima, MD as PCP - General (Family Medicine)  CHIEF COMPLAINTS/PURPOSE OF CONSULTATION:  F/u for FL  HISTORY OF PRESENTING ILLNESS:   Jeffery Wilson is a wonderful 68 y.o. male who has been referred to [place] by Dr. Helane Rima for evaluation and management of his concern for a Lymphoproliferative process. He is accompanied today by his daughter and wife. The pt reports that he is doing well overall.   The pt reports that he first noticed the left jaw mass two years ago which has slowly grown. 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 The pt notes that his PCP Dr. Lavone Neri noticed the mass in July/August at a wellness visit and he was subsequently referred to ENT Dr. Fenton Malling at [facility]. The pt denies pain, discomfort, fevers, chills, night sweats, unexpected weight loss, and any other lumps or bumps. He notes that he is not feeling any differently recently as compared to the last 6 months to a year.   The pt also notes that his right lower leg is also larger than his left, and has been this way all of his life. He notes that his ankle swelling is stable and has varicose veins.   The pt notes hammer toe surgery and arthroscopic left knee surgery. He notes that he was previously diagnosed with CKD but has managed this well and notes his creatinine has nearly normalized. He also notes that has hypertension.   The pt notes that he had Prevnar and Pneumovax last year and regularly receives his annual flu vaccine.   Of note prior to the patient's visit today, pt has had a Submandibular gland biopsy completed on 01/28/18 with results revealing concern for a lymphoproliferative process.   The pt also had a 01/15/18 CT Neck which revealed 3.3 x 4.1 x 5 cm solid LEFT submandibular  gland mass. Differential diagnosis includes benign mixed tumor or carcinoma. 2. Prominent though not pathologically enlarged LEFT lymph nodes potentially metastatic if tumor is malignant. 3. For above findings, recommend ENT consultation and histopathologic correlation.  Most recent lab results (01/28/18) of CBC is as follows: all values are WNL.  On review of systems, pt reports left jaw mass, stable energy levels, moving his bowels well, stable ankle swelling, and denies fevers, chills, night sweats, painful jaw mass, discomfort at jaw mass, unexpected weight loss, problems swallowing, pain along the spine, itching, back pain, new fatigue, abdominal pains, problems passing urine, and any other symptoms.   On PMHx the pt reports CKD, HTN, left knee arthroscopic knee surgery in 2016. 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 The pt smokes one cigarette every morning. He drinks 2-3 drinks of whiskey most afternoons, 4-6 on the weekends.  On Family Hx the pt reports father with MI at age 57. Denies blood disorders or cancer.  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 The patient's last visit with [place] was on 06/09/18. He is accompanied today by his wife and daughter. The pt reports that he is doing well overall.  The pt reports that he has been eating very well, has had stable weight, endorses good energy levels and has recently enjoyed more time with  family. He denies any recent infections. He does note that he felt some dizziness yesterday when he got up from bed, denies diarrhea, is staying hydrated, and continues on Lasix and Toprol XL. He checked his BP at the time and notes it was 145/89, with HR at 59. He does endorse some positional elements to this. He notes that he had a similar experience in 2012.   The pt denies any new lumps or bumps and denies abdominal pains.  Of note since the patient's  last visit, pt has had a PET/CT completed on 08/04/18 with results revealing Response to therapy, as evidenced by mildly decreased size and hypermetabolism within a dominant left submandibular mass. The previously described left-sided cervical nodal hypermetabolism has resolved. 2. Decrease in right inguinal nodal hypermetabolism, favored to be physiologic or reactive. 3. No new or progressive disease. 4. Incidental findings, including cholelithiasis, left adrenal adenoma.  Lab results today (08/11/18) of CBC w/diff and CMP is as follows: all values are WNL except for Glucose at 104, Creatinine at 1.50, GFR at 47. 08/11/18 LDH is . Lab Results  Component Value Date   LDH 164 08/11/2018   On review of systems, pt reports eating well, stable weight, good energy levels, recent dizziness, and denies recent infections, cough, cold, noticing any new lumps or bumps, abdominal pains, testicular pain or swelling, and any other symptoms.    MEDICAL HISTORY:  Past Medical History:  Diagnosis Date  . Hypertension   . Renal disorder    pt says stage 3 kidney dz     SURGICAL HISTORY: Past Surgical History:  Procedure Laterality Date  . HAMMER TOE SURGERY    . KNEE SURGERY      SOCIAL HISTORY: Social History   Socioeconomic History  . Marital status: Married    Spouse name: Not on file  . Number of children: Not on file  . Years of education: Not on file  . Highest education level: Not on file  Occupational History  . Not on file  Social Needs  . Financial resource strain: Not on file  . Food insecurity:    Worry: Not on file    Inability: Not on file  . Transportation needs:    Medical: Not on file    Non-medical: Not on file  Tobacco Use  . Smoking status: Never Smoker  . Smokeless tobacco: Never Used  Substance and Sexual Activity  . Alcohol use: Yes    Comment: daily   . Drug use: No  . Sexual activity: Not on file  Lifestyle  . Physical activity:    Days per week: Not on  file    Minutes per session: Not on file  . Stress: Not on file  Relationships  . Social connections:    Talks on phone: Not on file    Gets together: Not on file    Attends religious service: Not on file    Active member of club or organization: Not on file    Attends meetings of clubs or organizations: Not on file    Relationship status: Not on file  . Intimate partner violence:    Fear of current or ex partner: Not on file    Emotionally abused: Not on file    Physically abused: Not on file    Forced sexual activity: Not on file  Other Topics Concern  . Not on file  Social History Narrative  . Not on file    FAMILY HISTORY: No family history  on file.  ALLERGIES:  has No Known Allergies.  MEDICATIONS:  Current Outpatient Medications  Medication Sig Dispense Refill  . acetaminophen (TYLENOL) 500 MG tablet Take 1,000 mg by mouth every 6 (six) hours as needed for moderate pain or headache.    . Artificial Tear Solution (GENTEAL TEARS OP) Place 1 drop into both eyes at bedtime.    Beryl Meager Tar Extract [phone] PSORIASIS MEDICATED EX) Apply 1 application topically daily.    Marland Kitchen dexamethasone (DECADRON) 4 MG tablet Take 2 tablets (8 mg total) by mouth once as needed for up to 1 dose (8mg  once with lunch on day before each Rituxan dose). 10 tablet 0  . dicyclomine (BENTYL) 20 MG tablet Take 1 tablet (20 mg total) by mouth 3 (three) times daily before meals. As needed for abdominal cramping (Patient not taking: Reported on 05/25/2018) 20 tablet 0  . furosemide (LASIX) 40 MG tablet Take 40 mg by mouth daily.     . metoprolol succinate (TOPROL-XL) 50 MG 24 hr tablet Take 50 mg by mouth daily. Take with or immediately following a meal.    . Milk Thistle 1000 MG CAPS Take 1 tablet by mouth.    . Multiple Vitamins-Minerals (MULTIVITAMIN PO) Take 1 tablet by mouth daily.    . Omega-3 Fatty Acids (OMEGA 3 PO) Take 1 capsule by mouth 2 (two) times daily.    . ondansetron (ZOFRAN ODT) 4 MG  disintegrating tablet Take 1 tablet (4 mg total) by mouth every 8 (eight) hours as needed for nausea or vomiting. (Patient not taking: Reported on 01/20/2018) 20 tablet 0  . pravastatin (PRAVACHOL) 20 MG tablet Take 20 mg by mouth at bedtime.     . Turmeric 500 MG CAPS Take 500 mg by mouth daily.     No current facility-administered medications for this visit.     REVIEW OF SYSTEMS:    A 10+ POINT REVIEW OF SYSTEMS WAS OBTAINED including neurology, dermatology, psychiatry, cardiac, respiratory, lymph, extremities, GI, GU, Musculoskeletal, constitutional, breasts, reproductive, HEENT.  All pertinent positives are noted in the HPI.  All others are negative.   PHYSICAL EXAMINATION: ECOG PERFORMANCE STATUS: 1 - Symptomatic but completely ambulatory  Vitals:   08/11/18 1009  BP: (!) 152/88  Pulse: (!) 56  Resp: 17  Temp: 98.2 F (36.8 C)  SpO2: 100%   Filed Weights   08/11/18 1009  Weight: 269 lb 11.2 oz (122.3 kg)   .Body mass index is 38.7 kg/m.  GENERAL:alert, in no acute distress and comfortable SKIN: no acute rashes, no significant lesions, some venous stasis skin changes on LRE EYES: conjunctiva are pink and non-injected, sclera anicteric OROPHARYNX: MMM, no exudates, no oropharyngeal erythema or ulceration NECK: supple, no JVD, much reduced about 3cm swelling around the left mandible and upper neck LYMPH:  no palpable lymphadenopathy in the cervical, axillary or inguinal regions LUNGS: clear to auscultation b/l with normal respiratory effort HEART: regular rate & rhythm ABDOMEN:  normoactive bowel sounds , non tender, not distended. No palpable hepatosplenomegaly.  Extremity: 1+ pedal edema, venous stasis skin changes on LRE PSYCH: alert & oriented x 3 with fluent speech NEURO: no focal motor/sensory deficits   LABORATORY DATA:  I have reviewed the data as listed  . CBC Latest Ref Rng & Units 08/11/2018 06/09/2018 06/02/2018  WBC 4.0 - 10.5 K/uL 5.1 9.8 11.3(H)    Hemoglobin 13.0 - 17.0 g/dL 15.2 15.3 15.6  Hematocrit 39.0 - 52.0 % 45.7 44.3 45.9  Platelets 150 -  400 K/uL 210 194 223    . CMP Latest Ref Rng & Units 08/11/2018 06/09/2018 06/02/2018  Glucose 70 - 99 mg/dL 104(H) 152(H) 125(H)  BUN 8 - 23 mg/dL 16 20 21   Creatinine 0.61 - 1.24 mg/dL 1.50(H) 1.46(H) 1.54(H)  Sodium 135 - 145 mmol/L 141 137 138  Potassium 3.5 - 5.1 mmol/L 4.3 4.3 4.4  Chloride 98 - 111 mmol/L 102 103 102  CO2 22 - 32 mmol/L 29 21(L) 22  Calcium 8.9 - 10.3 mg/dL 9.5 9.6 10.0  Total Protein 6.5 - 8.1 g/dL 7.3 7.0 7.2  Total Bilirubin 0.3 - 1.2 mg/dL 0.9 0.7 0.8  Alkaline Phos 38 - 126 U/L 52 55 56  AST 15 - 41 U/L 22 14(L) 14(L)  ALT 0 - 44 U/L 19 24 24    Component     Latest Ref Rng & Units 03/24/2018  Hepatitis B Surface Ag     Negative Negative  Hep B Core Ab, Tot     Negative Negative  HIV Screen 4th Generation wRfx     Non Reactive Non Reactive  HCV Ab     0.0 - 0.9 s/co ratio <0.1  LDH     98 - 192 U/L 139   . Lab Results  Component Value Date   LDH 164 08/11/2018    01/28/18 Submandibular gland biopsy:    RADIOGRAPHIC STUDIES: I have personally reviewed the radiological images as listed and agreed with the findings in the report. Nm Pet Image Restag (ps) Skull Base To Thigh  Result Date: 08/04/2018 CLINICAL DATA:  Subsequent treatment strategy for restaging of follicular lymphoma. Chronic kidney disease. EXAM: NUCLEAR MEDICINE PET SKULL BASE TO THIGH TECHNIQUE: 13.7 mCi F-18 FDG was injected intravenously. Full-ring PET imaging was performed from the skull base to thigh after the radiotracer. CT data was obtained and used for attenuation correction and anatomic localization. Fasting blood glucose: 103 mg/dl COMPARISON:  04/02/2018 FINDINGS: Mediastinal blood pool activity: SUV max 3.4 NECK: Left submandibular mass measures 4.7 x 3.3 cm and a S.U.V. max of 5.0 today. Central photopenia is indicative of necrosis. Compare 5.3 by 4.1 cm and a S.U.V.  max of 6.9 on the prior exam (when remeasured). The level 3 hypermetabolic cervical nodes are no longer identified. Incidental CT findings: Left maxillary sinus mucous retention cyst or polyp. CHEST: No pulmonary parenchymal or thoracic nodal hypermetabolism. Incidental CT findings: No axillary adenopathy. No mediastinal or definite hilar adenopathy, given limitations of unenhanced CT. ABDOMEN/PELVIS: Index right inguinal node measures 9 mm and a S.U.V. max of 1.5 versus similar in size and a S.U.V. max of 2.3 on the prior. Incidental CT findings: 2.1 cm low-density left adrenal nodule, consistent with an adenoma. Multiple small gallstones. Too small to characterize interpolar left renal lesion. Scattered colonic diverticula. SKELETON: No abnormal marrow activity. Incidental CT findings: none IMPRESSION: 1. Response to therapy, as evidenced by mildly decreased size and hypermetabolism within a dominant left submandibular mass. The previously described left-sided cervical nodal hypermetabolism has resolved. 2. Decrease in right inguinal nodal hypermetabolism, favored to be physiologic or reactive. 3. No new or progressive disease. 4. Incidental findings, including cholelithiasis, left adrenal adenoma. Electronically Signed   By: Abigail Miyamoto M.D.   On: 08/04/2018 12:36    ASSESSMENT & PLAN:   68 y.o. male with  1. 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 Additional nonenlarged lymph nodes in the left neck shows low level FDG accumulation, suspicious for tumor involvement. 2. Tiny right groin lymph  node shows low level FDG uptake, indeterminate. Otherwise, no evidence for hypermetabolic disease in the chest, abdomen, or pelvis. 3. Hepatic steatosis. 4. Cholelithiasis. 5. Colonic diverticulosis without diverticulitis.   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 The previously described left-sided cervical nodal hypermetabolism has resolved. 2. Decrease in right inguinal nodal hypermetabolism, favored to be physiologic or reactive. 3. No new or progressive disease. 4. Incidental findings, including cholelithiasis, left adrenal adenoma. -Pt has responded well to 4 weekly cycles of Rituxan -Discussed the option to pursue local RT vs continuing watchful observation. He prefers to watch at this time. -Pt will let me know if his left submandibular involvement enlarges or changes in the interim -Recommend following up with PCP for evaluation of possible benign positional vertigo -Fine to return to the dentist at this time -The pt is up to date with his Prevnar and Pneumovax vaccines -Recommended that the pt use graded sports compression socks to prevent/help with venous stasis dermatitis -Will see the pt back in 10 weeks   RTC with dr [name] with labs in 10 weeks    All of the patients questions were answered with apparent satisfaction. The patient knows to call the clinic with any problems, questions or concerns.  The total time spent in the appt was 25 minutes and more than 50% was on counseling and direct patient cares.    Sullivan Lone MD MS AAHIVMS [facility]  [facility] [place]  (Office):       [phone] (Work cell):  [phone] (Fax):           [phone]  08/11/2018 11:04 AM  I, Baldwin Jamaica, am acting as a scribe for Dr. Sullivan Lone.   .I have reviewed the above documentation for accuracy and completeness, and I agree with the above. Brunetta Genera MD

## 2018-08-11 ENCOUNTER — Inpatient Hospital Stay: Payer: Medicare HMO

## 2018-08-11 ENCOUNTER — Inpatient Hospital Stay: Payer: Medicare HMO | Attending: Hematology | Admitting: Hematology

## 2018-08-11 ENCOUNTER — Telehealth: Payer: Self-pay | Admitting: Hematology

## 2018-08-11 VITALS — BP 152/88 | HR 56 | Temp 98.2°F | Resp 17 | Ht 70.0 in | Wt 269.7 lb

## 2018-08-11 DIAGNOSIS — N189 Chronic kidney disease, unspecified: Secondary | ICD-10-CM

## 2018-08-11 DIAGNOSIS — C8211 Follicular lymphoma grade II, lymph nodes of head, face, and neck: Secondary | ICD-10-CM | POA: Insufficient documentation

## 2018-08-11 DIAGNOSIS — I10 Essential (primary) hypertension: Secondary | ICD-10-CM | POA: Insufficient documentation

## 2018-08-11 DIAGNOSIS — Z79899 Other long term (current) drug therapy: Secondary | ICD-10-CM | POA: Diagnosis not present

## 2018-08-11 LAB — CMP (CANCER CENTER ONLY)
ALT: 19 U/L (ref 0–44)
AST: 22 U/L (ref 15–41)
Albumin: 4.2 g/dL (ref 3.5–5.0)
Alkaline Phosphatase: 52 U/L (ref 38–126)
Anion gap: 10 (ref 5–15)
BUN: 16 mg/dL (ref 8–23)
CO2: 29 mmol/L (ref 22–32)
Calcium: 9.5 mg/dL (ref 8.9–10.3)
Chloride: 102 mmol/L (ref 98–111)
Creatinine: 1.5 mg/dL — ABNORMAL HIGH (ref 0.61–1.24)
GFR, EST AFRICAN AMERICAN: 55 mL/min — AB (ref >60)
GFR, Estimated: 47 mL/min — ABNORMAL LOW (ref >60)
Glucose, Bld: 104 mg/dL — ABNORMAL HIGH (ref 70–99)
Potassium: 4.3 mmol/L (ref 3.5–5.1)
SODIUM: 141 mmol/L (ref 135–145)
Total Bilirubin: 0.9 mg/dL (ref 0.3–1.2)
Total Protein: 7.3 g/dL (ref 6.5–8.1)

## 2018-08-11 LAB — CBC WITH DIFFERENTIAL/PLATELET
Abs Immature Granulocytes: 0.03 10*3/uL (ref 0.00–0.07)
BASOS ABS: 0.1 10*3/uL (ref 0.0–0.1)
Basophils Relative: 1 %
EOS ABS: 0.2 10*3/uL (ref 0.0–0.5)
Eosinophils Relative: 4 %
HEMATOCRIT: 45.7 % (ref 39.0–52.0)
Hemoglobin: 15.2 g/dL (ref 13.0–17.0)
Immature Granulocytes: 1 %
Lymphocytes Relative: 26 %
Lymphs Abs: 1.3 10*3/uL (ref 0.7–4.0)
MCH: 33 pg (ref 26.0–34.0)
MCHC: 33.3 g/dL (ref 30.0–36.0)
MCV: 99.3 fL (ref 80.0–100.0)
Monocytes Absolute: 0.7 10*3/uL (ref 0.1–1.0)
Monocytes Relative: 14 %
Neutro Abs: 2.8 10*3/uL (ref 1.7–7.7)
Neutrophils Relative %: 54 %
Platelets: 210 10*3/uL (ref 150–400)
RBC: 4.6 MIL/uL (ref 4.22–5.81)
RDW: 13.4 % (ref 11.5–15.5)
WBC: 5.1 10*3/uL (ref 4.0–10.5)
nRBC: 0 % (ref 0.0–0.2)

## 2018-08-11 LAB — LACTATE DEHYDROGENASE: LDH: 164 U/L (ref 98–192)

## 2018-08-11 NOTE — Telephone Encounter (Signed)
Gave avs and calendar ° °

## 2018-08-24 ENCOUNTER — Telehealth: Payer: Self-pay | Admitting: *Deleted

## 2018-08-24 NOTE — Telephone Encounter (Signed)
Patient's wife called - patient developed laryngitis on Saturday. No fever or pain. Slight cough w/sm amt phlegm -  from sinus drainage per wife. Patient taking, Mucinex and tylenol and using Flonase. Patient also gargling with warm salt water several times a day. Wife states Dr. [name] told them to always let office know if patient is sick. Thanked wife for information and advised that they can also contact PCP at this time. Advised also to contact Dr. Lynnda Shields for temperature over 100.5, pain or increase in any symptoms. Wife verbalized understanding.

## 2018-10-12 NOTE — Progress Notes (Signed)
HEMATOLOGY/ONCOLOGY CLINIC NOTE  Date of Service: 10/13/2018  Patient Care Team: Helane Rima, MD as PCP - General (Family Medicine)  CHIEF COMPLAINTS/PURPOSE OF CONSULTATION:  F/u for FL  HISTORY OF PRESENTING ILLNESS:   Jeffery Wilson is a wonderful 68 y.o. male who has been referred to [place] by Dr. Helane Rima for evaluation and management of his concern for a Lymphoproliferative process. He is accompanied today by his daughter and wife. The pt reports that he is doing well overall.   The pt reports that he first noticed the left jaw mass two years ago which has slowly grown. 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 The pt notes that his PCP Dr. Lavone Neri noticed the mass in July/August at a wellness visit and he was subsequently referred to ENT Dr. Fenton Malling at [facility]. The pt denies pain, discomfort, fevers, chills, night sweats, unexpected weight loss, and any other lumps or bumps. He notes that he is not feeling any differently recently as compared to the last 6 months to a year.   The pt also notes that his right lower leg is also larger than his left, and has been this way all of his life. He notes that his ankle swelling is stable and has varicose veins.   The pt notes hammer toe surgery and arthroscopic left knee surgery. He notes that he was previously diagnosed with CKD but has managed this well and notes his creatinine has nearly normalized. He also notes that has hypertension.   The pt notes that he had Prevnar and Pneumovax last year and regularly receives his annual flu vaccine.   Of note prior to the patient's visit today, pt has had a Submandibular gland biopsy completed on 01/28/18 with results revealing concern for a lymphoproliferative process.   The pt also had a 01/15/18 CT Neck which revealed 3.3 x 4.1 x 5 cm solid LEFT submandibular  gland mass. Differential diagnosis includes benign mixed tumor or carcinoma. 2. Prominent though not pathologically enlarged LEFT lymph nodes potentially metastatic if tumor is malignant. 3. For above findings, recommend ENT consultation and histopathologic correlation.  Most recent lab results (01/28/18) of CBC is as follows: all values are WNL.  On review of systems, pt reports left jaw mass, stable energy levels, moving his bowels well, stable ankle swelling, and denies fevers, chills, night sweats, painful jaw mass, discomfort at jaw mass, unexpected weight loss, problems swallowing, pain along the spine, itching, back pain, new fatigue, abdominal pains, problems passing urine, and any other symptoms.   On PMHx the pt reports CKD, HTN, left knee arthroscopic knee surgery in 2016. 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 The pt smokes one cigarette every morning. He drinks 2-3 drinks of whiskey most afternoons, 4-6 on the weekends.  On Family Hx the pt reports father with MI at age 6. Denies blood disorders or cancer.  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 The patient's last visit with [place] was on 08/11/18. The pt reports that he is doing well overall.  The pt notes that he has not developed any new concerns in the interim. The pt reports that his left submandibular mass has continued to resolve and he doesn't notice any remaining enlargement. He  denies noticing any other new lumps or bumps. The pt denies mouth sores or ulcers, and denies dental work in the interim.  The pt notes that he had 8 days of a weak voice about a month ago, and denied having a fever. He notes that this resolved with salt and baking soda mouthwashes. He notes that he attributes this to environmental allergies. He denies any remaining symptoms whatsoever.  Lab results today (10/13/18) of CBC w/diff and CMP is as follows:  all values are WNL except for Glucose at 110, Creatinine at 1.50, GFR at 47. 10/13/18 LDH is . Lab Results  Component Value Date   LDH 160 10/13/2018     On review of systems, pt reports stable weight, eating well, resolved submandibular mass, and denies new lumps or bumps, mouth sores, ulcers, and any other symptoms.   MEDICAL HISTORY:  Past Medical History:  Diagnosis Date   Hypertension    Renal disorder    pt says stage 3 kidney dz     SURGICAL HISTORY: Past Surgical History:  Procedure Laterality Date   HAMMER TOE SURGERY     KNEE SURGERY      SOCIAL HISTORY: Social History   Socioeconomic History   Marital status: Married    Spouse name: Not on file   Number of children: Not on file   Years of education: Not on file   Highest education level: Not on file  Occupational History   Not on file  Social Needs   Financial resource strain: Not on file   Food insecurity:    Worry: Not on file    Inability: Not on file   Transportation needs:    Medical: Not on file    Non-medical: Not on file  Tobacco Use   Smoking status: Never Smoker   Smokeless tobacco: Never Used  Substance and Sexual Activity   Alcohol use: Yes    Comment: daily    Drug use: No   Sexual activity: Not on file  Lifestyle   Physical activity:    Days per week: Not on file    Minutes per session: Not on file   Stress: Not on file  Relationships   Social connections:    Talks on phone: Not on file    Gets together: Not on file    Attends religious service: Not on file    Active member of club or organization: Not on file    Attends meetings of clubs or organizations: Not on file    Relationship status: Not on file   Intimate partner violence:    Fear of current or ex partner: Not on file    Emotionally abused: Not on file    Physically abused: Not on file    Forced sexual activity: Not on file  Other Topics Concern   Not on file  Social History Narrative    Not on file    FAMILY HISTORY: No family history on file.  ALLERGIES:  has No Known Allergies.  MEDICATIONS:  Current Outpatient Medications  Medication Sig Dispense Refill   acetaminophen (TYLENOL) 500 MG tablet Take 1,000 mg by mouth every 6 (six) hours as needed for moderate pain or headache.     Artificial Tear Solution (GENTEAL TEARS OP) Place 1 drop into both eyes at bedtime.     Coal Tar Extract [phone] PSORIASIS MEDICATED EX) Apply 1 application topically daily.     dexamethasone (DECADRON) 4 MG tablet Take 2 tablets (8 mg total) by  mouth once as needed for up to 1 dose (8mg  once with lunch on day before each Rituxan dose). 10 tablet 0   dicyclomine (BENTYL) 20 MG tablet Take 1 tablet (20 mg total) by mouth 3 (three) times daily before meals. As needed for abdominal cramping (Patient not taking: Reported on 05/25/2018) 20 tablet 0   furosemide (LASIX) 40 MG tablet Take 40 mg by mouth daily.      metoprolol succinate (TOPROL-XL) 50 MG 24 hr tablet Take 50 mg by mouth daily. Take with or immediately following a meal.     Milk Thistle 1000 MG CAPS Take 1 tablet by mouth.     Multiple Vitamins-Minerals (MULTIVITAMIN PO) Take 1 tablet by mouth daily.     Omega-3 Fatty Acids (OMEGA 3 PO) Take 1 capsule by mouth 2 (two) times daily.     ondansetron (ZOFRAN ODT) 4 MG disintegrating tablet Take 1 tablet (4 mg total) by mouth every 8 (eight) hours as needed for nausea or vomiting. (Patient not taking: Reported on 01/20/2018) 20 tablet 0   pravastatin (PRAVACHOL) 20 MG tablet Take 20 mg by mouth at bedtime.      Turmeric 500 MG CAPS Take 500 mg by mouth daily.     No current facility-administered medications for this visit.     REVIEW OF SYSTEMS:    A 10+ POINT REVIEW OF SYSTEMS WAS OBTAINED including neurology, dermatology, psychiatry, cardiac, respiratory, lymph, extremities, GI, GU, Musculoskeletal, constitutional, breasts, reproductive, HEENT.  All pertinent positives are  noted in the HPI.  All others are negative.   PHYSICAL EXAMINATION: ECOG PERFORMANCE STATUS: 1 - Symptomatic but completely ambulatory  Vitals:   10/13/18 1017  BP: (!) 143/73  Pulse: 64  Resp: 17  Temp: 98 F (36.7 C)  SpO2: 100%   Filed Weights   10/13/18 1017  Weight: 270 lb 8 oz (122.7 kg)   .Body mass index is 38.81 kg/m.  GENERAL:alert, in no acute distress and comfortable SKIN: no acute rashes, no significant lesions, some venous stasis skin changes on LRE EYES: conjunctiva are pink and non-injected, sclera anicteric OROPHARYNX: MMM, no exudates, no oropharyngeal erythema or ulceration NECK: supple, no JVD LYMPH:  no palpable lymphadenopathy in the cervical, axillary or inguinal regions LUNGS: clear to auscultation b/l with normal respiratory effort HEART: regular rate & rhythm ABDOMEN:  normoactive bowel sounds , non tender, not distended. No palpable hepatosplenomegaly.  Extremity: 1+ pedal edema, venous stasis skin changes on LRE PSYCH: alert & oriented x 3 with fluent speech NEURO: no focal motor/sensory deficits   LABORATORY DATA:  I have reviewed the data as listed  . CBC Latest Ref Rng & Units 10/13/2018 08/11/2018 06/09/2018  WBC 4.0 - 10.5 K/uL 5.3 5.1 9.8  Hemoglobin 13.0 - 17.0 g/dL 15.7 15.2 15.3  Hematocrit 39.0 - 52.0 % 48.2 45.7 44.3  Platelets 150 - 400 K/uL 206 210 194    . CMP Latest Ref Rng & Units 10/13/2018 08/11/2018 06/09/2018  Glucose 70 - 99 mg/dL 110(H) 104(H) 152(H)  BUN 8 - 23 mg/dL 14 16 20   Creatinine 0.61 - 1.24 mg/dL 1.50(H) 1.50(H) 1.46(H)  Sodium 135 - 145 mmol/L 140 141 137  Potassium 3.5 - 5.1 mmol/L 4.4 4.3 4.3  Chloride 98 - 111 mmol/L 101 102 103  CO2 22 - 32 mmol/L 30 29 21(L)  Calcium 8.9 - 10.3 mg/dL 9.8 9.5 9.6  Total Protein 6.5 - 8.1 g/dL 7.7 7.3 7.0  Total Bilirubin 0.3 - 1.2 mg/dL 0.8  0.9 0.7  Alkaline Phos 38 - 126 U/L 59 52 55  AST 15 - 41 U/L 20 22 14(L)  ALT 0 - 44 U/L 21 19 24    Component     Latest  Ref Rng & Units 03/24/2018  Hepatitis B Surface Ag     Negative Negative  Hep B Core Ab, Tot     Negative Negative  HIV Screen 4th Generation wRfx     Non Reactive Non Reactive  HCV Ab     0.0 - 0.9 s/co ratio <0.1  LDH     98 - 192 U/L 139   . Lab Results  Component Value Date   LDH 160 10/13/2018    01/28/18 Submandibular gland biopsy:    RADIOGRAPHIC STUDIES: I have personally reviewed the radiological images as listed and agreed with the findings in the report. No results found.  ASSESSMENT & PLAN:   68 y.o. male with  1. 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 Additional nonenlarged lymph nodes in the left neck shows low level FDG accumulation, suspicious for tumor involvement. 2. Tiny right groin lymph node shows low level FDG uptake, indeterminate. Otherwise, no evidence for hypermetabolic disease in the chest, abdomen, or pelvis. 3. Hepatic steatosis. 4. Cholelithiasis. 5. Colonic diverticulosis without diverticulitis.   04/24/18 Left cervical lymph node incisional biopsy revealed Follicular lymphoma, Grade 1-2 with a 10% proliferation rate   S/p 4 weekly cycles of Rituxan (limited rate) completed on 06/09/18  08/04/18 PET/CT revealed Response to therapy, as evidenced by mildly decreased size and hypermetabolism within a dominant left submandibular mass. The previously described left-sided cervical nodal hypermetabolism has resolved. 2. Decrease in right inguinal nodal hypermetabolism, favored to be physiologic or reactive. 3. No new or progressive disease. 4.  Incidental findings, including cholelithiasis, left adrenal adenoma.  PLAN: -Discussed pt labwork today, 10/13/18; blood counts are normal, chemistries are stable. LDH is pending. -Patient has responded very well to Rituxan. Patient's left submandibular mass has resolved and is no longer clinically palpable. No current indication for RT or other treatments at this time. -The pt shows no clinical or lab progression/return of his Follicular lymphoma at this time. -The pt is up to date with his Prevnar and Pneumovax vaccines -Recommended that the pt use graded sports compression socks to prevent/help with venous stasis dermatitis -Recommended that the pt continue to eat well, drink at least 48-64 oz of water each day, and walk 20-30 minutes each day. -Will see the pt back in 4 months, sooner if any new concerns   RTC with Dr [name] with labs in 4 months    All of the patients questions were answered with apparent satisfaction. The patient knows to call the clinic with any problems, questions or concerns.  The total time spent in the appt was 20 minutes and more than 50% was on counseling and direct patient cares.    Sullivan Lone MD [place] AAHIVMS [facility] Hematology/Oncology Physician [facility]  (Office):       [phone] (Work cell):  [phone] (Fax):  [phone]  10/13/2018 11:08 AM  I, Baldwin Jamaica, am acting as a scribe for Dr. Sullivan Lone.   .I have reviewed the above documentation for accuracy and completeness, and I agree with the above. Brunetta Genera MD

## 2018-10-13 ENCOUNTER — Other Ambulatory Visit: Payer: Self-pay

## 2018-10-13 ENCOUNTER — Inpatient Hospital Stay: Payer: Medicare HMO | Attending: Hematology | Admitting: Hematology

## 2018-10-13 ENCOUNTER — Telehealth: Payer: Self-pay | Admitting: Hematology

## 2018-10-13 ENCOUNTER — Inpatient Hospital Stay: Payer: Medicare HMO

## 2018-10-13 VITALS — BP 143/73 | HR 64 | Temp 98.0°F | Resp 17 | Ht 70.0 in | Wt 270.5 lb

## 2018-10-13 DIAGNOSIS — I129 Hypertensive chronic kidney disease with stage 1 through stage 4 chronic kidney disease, or unspecified chronic kidney disease: Secondary | ICD-10-CM | POA: Diagnosis not present

## 2018-10-13 DIAGNOSIS — Z79899 Other long term (current) drug therapy: Secondary | ICD-10-CM

## 2018-10-13 DIAGNOSIS — M7989 Other specified soft tissue disorders: Secondary | ICD-10-CM | POA: Insufficient documentation

## 2018-10-13 DIAGNOSIS — N189 Chronic kidney disease, unspecified: Secondary | ICD-10-CM | POA: Insufficient documentation

## 2018-10-13 DIAGNOSIS — C8211 Follicular lymphoma grade II, lymph nodes of head, face, and neck: Secondary | ICD-10-CM

## 2018-10-13 DIAGNOSIS — I8390 Asymptomatic varicose veins of unspecified lower extremity: Secondary | ICD-10-CM | POA: Diagnosis not present

## 2018-10-13 DIAGNOSIS — F17210 Nicotine dependence, cigarettes, uncomplicated: Secondary | ICD-10-CM | POA: Insufficient documentation

## 2018-10-13 LAB — CMP (CANCER CENTER ONLY)
ALT: 21 U/L (ref 0–44)
AST: 20 U/L (ref 15–41)
Albumin: 4.2 g/dL (ref 3.5–5.0)
Alkaline Phosphatase: 59 U/L (ref 38–126)
Anion gap: 9 (ref 5–15)
BUN: 14 mg/dL (ref 8–23)
CO2: 30 mmol/L (ref 22–32)
Calcium: 9.8 mg/dL (ref 8.9–10.3)
Chloride: 101 mmol/L (ref 98–111)
Creatinine: 1.5 mg/dL — ABNORMAL HIGH (ref 0.61–1.24)
GFR, Est AFR Am: 54 mL/min — ABNORMAL LOW (ref >60)
GFR, Estimated: 47 mL/min — ABNORMAL LOW (ref >60)
Glucose, Bld: 110 mg/dL — ABNORMAL HIGH (ref 70–99)
Potassium: 4.4 mmol/L (ref 3.5–5.1)
Sodium: 140 mmol/L (ref 135–145)
Total Bilirubin: 0.8 mg/dL (ref 0.3–1.2)
Total Protein: 7.7 g/dL (ref 6.5–8.1)

## 2018-10-13 LAB — CBC WITH DIFFERENTIAL/PLATELET
Abs Immature Granulocytes: 0.03 10*3/uL (ref 0.00–0.07)
Basophils Absolute: 0 10*3/uL (ref 0.0–0.1)
Basophils Relative: 1 %
Eosinophils Absolute: 0.1 10*3/uL (ref 0.0–0.5)
Eosinophils Relative: 2 %
HCT: 48.2 % (ref 39.0–52.0)
Hemoglobin: 15.7 g/dL (ref 13.0–17.0)
Immature Granulocytes: 1 %
Lymphocytes Relative: 26 %
Lymphs Abs: 1.4 10*3/uL (ref 0.7–4.0)
MCH: 32.5 pg (ref 26.0–34.0)
MCHC: 32.6 g/dL (ref 30.0–36.0)
MCV: 99.8 fL (ref 80.0–100.0)
Monocytes Absolute: 0.7 10*3/uL (ref 0.1–1.0)
Monocytes Relative: 12 %
Neutro Abs: 3.1 10*3/uL (ref 1.7–7.7)
Neutrophils Relative %: 58 %
Platelets: 206 10*3/uL (ref 150–400)
RBC: 4.83 MIL/uL (ref 4.22–5.81)
RDW: 13.1 % (ref 11.5–15.5)
WBC: 5.3 10*3/uL (ref 4.0–10.5)
nRBC: 0 % (ref 0.0–0.2)

## 2018-10-13 LAB — LACTATE DEHYDROGENASE: LDH: 160 U/L (ref 98–192)

## 2018-10-13 NOTE — Telephone Encounter (Signed)
Scheduled appt per 4/21 los. °

## 2018-11-03 ENCOUNTER — Telehealth: Payer: Self-pay | Admitting: *Deleted

## 2018-11-03 NOTE — Telephone Encounter (Signed)
Opened in error

## 2018-11-04 ENCOUNTER — Telehealth: Payer: Self-pay | Admitting: *Deleted

## 2018-11-04 ENCOUNTER — Encounter: Payer: Self-pay | Admitting: *Deleted

## 2018-11-04 NOTE — Telephone Encounter (Signed)
Patient received notice for [employer] on June 11,2019 (Juror #[id number]). Mr. Mustard does not think it is a good idea right know since he has been instructed not to sit for extended periods of time. He wants to know if Dr. [name] agrees and if so, will he write a letter to excuse him from [place] duty this time. Dr. [name] provided letter requesting patient be excused from [place] duty this time. Ms. Wafer contacted and told that letter available for pick up. Letter labeled with patient name and left with front desk reception at [facility].

## 2019-02-12 ENCOUNTER — Telehealth: Payer: Self-pay | Admitting: Hematology

## 2019-02-12 ENCOUNTER — Inpatient Hospital Stay: Payer: Medicare HMO | Attending: Hematology

## 2019-02-12 ENCOUNTER — Inpatient Hospital Stay (HOSPITAL_BASED_OUTPATIENT_CLINIC_OR_DEPARTMENT_OTHER): Payer: Medicare HMO | Admitting: Hematology

## 2019-02-12 ENCOUNTER — Other Ambulatory Visit: Payer: Self-pay

## 2019-02-12 VITALS — BP 146/84 | HR 55 | Temp 98.3°F | Resp 20 | Ht 70.0 in | Wt 272.3 lb

## 2019-02-12 DIAGNOSIS — M7989 Other specified soft tissue disorders: Secondary | ICD-10-CM | POA: Insufficient documentation

## 2019-02-12 DIAGNOSIS — C8211 Follicular lymphoma grade II, lymph nodes of head, face, and neck: Secondary | ICD-10-CM | POA: Diagnosis present

## 2019-02-12 DIAGNOSIS — Z79899 Other long term (current) drug therapy: Secondary | ICD-10-CM | POA: Insufficient documentation

## 2019-02-12 DIAGNOSIS — N189 Chronic kidney disease, unspecified: Secondary | ICD-10-CM | POA: Diagnosis not present

## 2019-02-12 LAB — CMP (CANCER CENTER ONLY)
ALT: 24 U/L (ref 0–44)
AST: 26 U/L (ref 15–41)
Albumin: 4.5 g/dL (ref 3.5–5.0)
Alkaline Phosphatase: 59 U/L (ref 38–126)
Anion gap: 11 (ref 5–15)
BUN: 21 mg/dL (ref 8–23)
CO2: 26 mmol/L (ref 22–32)
Calcium: 9.8 mg/dL (ref 8.9–10.3)
Chloride: 101 mmol/L (ref 98–111)
Creatinine: 1.47 mg/dL — ABNORMAL HIGH (ref 0.61–1.24)
GFR, Est AFR Am: 56 mL/min — ABNORMAL LOW (ref >60)
GFR, Estimated: 48 mL/min — ABNORMAL LOW (ref >60)
Glucose, Bld: 106 mg/dL — ABNORMAL HIGH (ref 70–99)
Potassium: 4.4 mmol/L (ref 3.5–5.1)
Sodium: 138 mmol/L (ref 135–145)
Total Bilirubin: 0.8 mg/dL (ref 0.3–1.2)
Total Protein: 7.8 g/dL (ref 6.5–8.1)

## 2019-02-12 LAB — CBC WITH DIFFERENTIAL/PLATELET
Abs Immature Granulocytes: 0.04 10*3/uL (ref 0.00–0.07)
Basophils Absolute: 0 10*3/uL (ref 0.0–0.1)
Basophils Relative: 1 %
Eosinophils Absolute: 0.2 10*3/uL (ref 0.0–0.5)
Eosinophils Relative: 3 %
HCT: 47.4 % (ref 39.0–52.0)
Hemoglobin: 15.9 g/dL (ref 13.0–17.0)
Immature Granulocytes: 1 %
Lymphocytes Relative: 27 %
Lymphs Abs: 1.6 10*3/uL (ref 0.7–4.0)
MCH: 32.9 pg (ref 26.0–34.0)
MCHC: 33.5 g/dL (ref 30.0–36.0)
MCV: 98.1 fL (ref 80.0–100.0)
Monocytes Absolute: 0.7 10*3/uL (ref 0.1–1.0)
Monocytes Relative: 11 %
Neutro Abs: 3.5 10*3/uL (ref 1.7–7.7)
Neutrophils Relative %: 57 %
Platelets: 194 10*3/uL (ref 150–400)
RBC: 4.83 MIL/uL (ref 4.22–5.81)
RDW: 13.2 % (ref 11.5–15.5)
WBC: 6 10*3/uL (ref 4.0–10.5)
nRBC: 0 % (ref 0.0–0.2)

## 2019-02-12 LAB — LACTATE DEHYDROGENASE: LDH: 198 U/L — ABNORMAL HIGH (ref 98–192)

## 2019-02-12 NOTE — Progress Notes (Signed)
HEMATOLOGY/ONCOLOGY CLINIC NOTE  Date of Service: 02/12/2019  Patient Care Team: Gregor Hams, FNP as PCP - General (Family Medicine)  CHIEF COMPLAINTS/PURPOSE OF CONSULTATION:  F/u for [place]  HISTORY OF PRESENTING ILLNESS:   Jeffery Wilson is a wonderful 68 y.o. male who has been referred to [place] by Dr. Helane Rima for evaluation and management of his concern for a Lymphoproliferative process. He is accompanied today by his daughter and wife. The pt reports that he is doing well overall.   The pt reports that he first noticed the left jaw mass two years ago which has slowly grown. 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 The pt notes that his PCP Dr. Lavone Neri noticed the mass in July/August at a wellness visit and he was subsequently referred to ENT Dr. Fenton Malling at [facility] The pt denies pain, discomfort, fevers, chills, night sweats, unexpected weight loss, and any other lumps or bumps. He notes that he is not feeling any differently recently as compared to the last 6 months to a year.   The pt also notes that his right lower leg is also larger than his left, and has been this way all of his life. He notes that his ankle swelling is stable and has varicose veins.   The pt notes hammer toe surgery and arthroscopic left knee surgery. He notes that he was previously diagnosed with CKD but has managed this well and notes his creatinine has nearly normalized. He also notes that has hypertension.   The pt notes that he had Prevnar and Pneumovax last year and regularly receives his annual flu vaccine.   Of note prior to the patient's visit today, pt has had a Submandibular gland biopsy completed on 01/28/18 with results revealing concern for a lymphoproliferative process.   The pt also had a 01/15/18 CT Neck which revealed 3.3 x 4.1 x 5 cm solid LEFT  submandibular gland mass. Differential diagnosis includes benign mixed tumor or carcinoma. 2. Prominent though not pathologically enlarged LEFT lymph nodes potentially metastatic if tumor is malignant. 3. For above findings, recommend ENT consultation and histopathologic correlation.  Most recent lab results (01/28/18) of CBC is as follows: all values are WNL.  On review of systems, pt reports left jaw mass, stable energy levels, moving his bowels well, stable ankle swelling, and denies fevers, chills, night sweats, painful jaw mass, discomfort at jaw mass, unexpected weight loss, problems swallowing, pain along the spine, itching, back pain, new fatigue, abdominal pains, problems passing urine, and any other symptoms.   On PMHx the pt reports CKD, HTN, left knee arthroscopic knee surgery in 2016. 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 The pt smokes one cigarette every morning. He drinks 2-3 drinks of whiskey most afternoons, 4-6 on the weekends.  On Family Hx the pt reports father with MI at age 53. Denies blood disorders or cancer.   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 The patient's last visit with [place] was on 10/13/2018. The pt reports that he is doing well overall. His knees are not feeling any worse. Pt has acid reflux occasionally.  The pt reports that he has no new concerns and is feeling stable. He has been eating well.   Lab  results today (02/12/19) of CBC w/diff and CMP is as follows: all values are WNL except for Glucose at 106, Creatinine at 1.47, GFR Est Non Af Am at 48. 02/12/2019 LDH at 198.   On review of systems, pt reports occasional acid reflux and denies new lumps/ bumps, fever, chills, night sweats, weight loss, abdominal pain and any other symptoms.    MEDICAL HISTORY:  Past Medical History:  Diagnosis Date  . Hypertension   . Renal disorder    pt says stage 3  kidney dz     SURGICAL HISTORY: Past Surgical History:  Procedure Laterality Date  . HAMMER TOE SURGERY    . KNEE SURGERY      SOCIAL HISTORY: Social History   Socioeconomic History  . Marital status: Married    Spouse name: Not on file  . Number of children: Not on file  . Years of education: Not on file  . Highest education level: Not on file  Occupational History  . Not on file  Social Needs  . Financial resource strain: Not on file  . Food insecurity    Worry: Not on file    Inability: Not on file  . Transportation needs    Medical: Not on file    Non-medical: Not on file  Tobacco Use  . Smoking status: Never Smoker  . Smokeless tobacco: Never Used  Substance and Sexual Activity  . Alcohol use: Yes    Comment: daily   . Drug use: No  . Sexual activity: Not on file  Lifestyle  . Physical activity    Days per week: Not on file    Minutes per session: Not on file  . Stress: Not on file  Relationships  . Social Herbalist on phone: Not on file    Gets together: Not on file    Attends religious service: Not on file    Active member of club or organization: Not on file    Attends meetings of clubs or organizations: Not on file    Relationship status: Not on file  . Intimate partner violence    Fear of current or ex partner: Not on file    Emotionally abused: Not on file    Physically abused: Not on file    Forced sexual activity: Not on file  Other Topics Concern  . Not on file  Social History Narrative  . Not on file    FAMILY HISTORY: No family history on file.  ALLERGIES:  has No Known Allergies.  MEDICATIONS:  Current Outpatient Medications  Medication Sig Dispense Refill  . acetaminophen (TYLENOL) 500 MG tablet Take 1,000 mg by mouth every 6 (six) hours as needed for moderate pain or headache.    . Artificial Tear Solution (GENTEAL TEARS OP) Place 1 drop into both eyes at bedtime.    Beryl Meager Tar Extract [phone] PSORIASIS MEDICATED EX)  Apply 1 application topically daily.    Marland Kitchen dexamethasone (DECADRON) 4 MG tablet Take 2 tablets (8 mg total) by mouth once as needed for up to 1 dose (8mg  once with lunch on day before each Rituxan dose). 10 tablet 0  . dicyclomine (BENTYL) 20 MG tablet Take 1 tablet (20 mg total) by mouth 3 (three) times daily before meals. As needed for abdominal cramping (Patient not taking: Reported on 05/25/2018) 20 tablet 0  . furosemide (LASIX) 40 MG tablet Take 40 mg by mouth daily.     . metoprolol succinate (TOPROL-XL) 50 MG  24 hr tablet Take 50 mg by mouth daily. Take with or immediately following a meal.    . Milk Thistle 1000 MG CAPS Take 1 tablet by mouth.    . Multiple Vitamins-Minerals (MULTIVITAMIN PO) Take 1 tablet by mouth daily.    . Omega-3 Fatty Acids (OMEGA 3 PO) Take 1 capsule by mouth 2 (two) times daily.    . ondansetron (ZOFRAN ODT) 4 MG disintegrating tablet Take 1 tablet (4 mg total) by mouth every 8 (eight) hours as needed for nausea or vomiting. (Patient not taking: Reported on 01/20/2018) 20 tablet 0  . pravastatin (PRAVACHOL) 20 MG tablet Take 20 mg by mouth at bedtime.     . Turmeric 500 MG CAPS Take 500 mg by mouth daily.     No current facility-administered medications for this visit.     REVIEW OF SYSTEMS:   A 10+ POINT REVIEW OF SYSTEMS WAS OBTAINED including neurology, dermatology, psychiatry, cardiac, respiratory, lymph, extremities, GI, GU, Musculoskeletal, constitutional, breasts, reproductive, HEENT.  All pertinent positives are noted in the HPI.  All others are negative.    PHYSICAL EXAMINATION: ECOG PERFORMANCE STATUS: 1 - Symptomatic but completely ambulatory  Vitals:   02/12/19 1024  BP: (!) 146/84  Pulse: (!) 55  Resp: 20  Temp: 98.3 F (36.8 C)  SpO2: 100%   Filed Weights   02/12/19 1024  Weight: 272 lb 4.8 oz (123.5 kg)   .Body mass index is 39.07 kg/m.   GENERAL:alert, in no acute distress and comfortable SKIN: no acute rashes, no significant  lesions EYES: conjunctiva are pink and non-injected, sclera anicteric OROPHARYNX: MMM, no exudates, no oropharyngeal erythema or ulceration NECK: supple, no JVD LYMPH:  no palpable lymphadenopathy in the cervical, axillary or inguinal regions LUNGS: clear to auscultation b/l with normal respiratory effort HEART: regular rate & rhythm ABDOMEN:  normoactive bowel sounds , non tender, not distended. Extremity: no pedal edema PSYCH: alert & oriented x 3 with fluent speech NEURO: no focal motor/sensory deficits    LABORATORY DATA:  I have reviewed the data as listed  . CBC Latest Ref Rng & Units 02/12/2019 10/13/2018 08/11/2018  WBC 4.0 - 10.5 K/uL 6.0 5.3 5.1  Hemoglobin 13.0 - 17.0 g/dL 15.9 15.7 15.2  Hematocrit 39.0 - 52.0 % 47.4 48.2 45.7  Platelets 150 - 400 K/uL 194 206 210    . CMP Latest Ref Rng & Units 02/12/2019 10/13/2018 08/11/2018  Glucose 70 - 99 mg/dL 106(H) 110(H) 104(H)  BUN 8 - 23 mg/dL 21 14 16   Creatinine 0.61 - 1.24 mg/dL 1.47(H) 1.50(H) 1.50(H)  Sodium 135 - 145 mmol/L 138 140 141  Potassium 3.5 - 5.1 mmol/L 4.4 4.4 4.3  Chloride 98 - 111 mmol/L 101 101 102  CO2 22 - 32 mmol/L 26 30 29   Calcium 8.9 - 10.3 mg/dL 9.8 9.8 9.5  Total Protein 6.5 - 8.1 g/dL 7.8 7.7 7.3  Total Bilirubin 0.3 - 1.2 mg/dL 0.8 0.8 0.9  Alkaline Phos 38 - 126 U/L 59 59 52  AST 15 - 41 U/L 26 20 22   ALT 0 - 44 U/L 24 21 19    Component     Latest Ref Rng & Units 03/24/2018  Hepatitis B Surface Ag     Negative Negative  Hep B Core Ab, Tot     Negative Negative  HIV Screen 4th Generation wRfx     Non Reactive Non Reactive  HCV Ab     0.0 - 0.9 s/co ratio <0.1  LDH     98 - 192 U/L 139   . Lab Results  Component Value Date   LDH 198 (H) 02/12/2019    01/28/18 Submandibular gland biopsy:    RADIOGRAPHIC STUDIES: I have personally reviewed the radiological images as listed and agreed with the findings in the report. No results found.  ASSESSMENT & PLAN:   68 y.o. male with   1. 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 Additional nonenlarged lymph nodes in the left neck shows low level FDG accumulation, suspicious for tumor involvement. 2. Tiny right groin lymph node shows low level FDG uptake, indeterminate. Otherwise, no evidence for hypermetabolic disease in the chest, abdomen, or pelvis. 3. Hepatic steatosis. 4. Cholelithiasis. 5. Colonic diverticulosis without diverticulitis.   04/24/18 Left cervical lymph node incisional biopsy revealed Follicular lymphoma, Grade 1-2 with a 10% proliferation rate   S/p 4 weekly cycles of Rituxan (limited rate) completed on 06/09/18  08/04/18 PET/CT revealed Response to therapy, as evidenced by mildly decreased size and hypermetabolism within a dominant left submandibular mass. The previously described left-sided cervical nodal hypermetabolism has resolved. 2. Decrease in right inguinal nodal hypermetabolism, favored to be physiologic or reactive. 3. No new or progressive disease. 4. Incidental findings, including cholelithiasis, left adrenal adenoma.    PLAN:  -Discussed pt labwork today, 02/12/19; Blood counts & chemistries are stable as well as his creatinine.  -02/12/2019 LDH is stable as well  -no clinical or lab  Evidence to indicate no active Folicular lymphoma at this time.  -Will follow up in 6 months with labs.    FOLLOW UP: - F/U in 6 months with Dr [name] with labs   The total time spent in the appt was 15 minutes and more than 50% was on counseling and direct patient cares.  All of the patient's  questions were answered with apparent satisfaction. The patient knows to call the clinic with any problems, questions or concerns.     Sullivan Lone MD [place] AAHIVMS [facility] Hematology/Oncology Physician [facility]   (Office):       [phone] (Work cell):  [phone] (Fax):           [phone]  02/12/2019 2:50 PM  I, Yevette Edwards, am acting as a scribe for Dr. Sullivan Lone.   .I have reviewed the above documentation for accuracy and completeness, and I agree with the above. Brunetta Genera MD

## 2019-02-12 NOTE — Telephone Encounter (Signed)
Scheduled appt per 8/21 los.  Patient and spouse are both aware of the appt date and time

## 2019-03-09 ENCOUNTER — Telehealth: Payer: Self-pay | Admitting: *Deleted

## 2019-03-09 NOTE — Telephone Encounter (Signed)
Patient spouse called - patient has new number [phone]. Information updated.

## 2019-08-12 NOTE — Progress Notes (Signed)
HEMATOLOGY/ONCOLOGY CLINIC NOTE  Date of Service: 08/13/2019  Patient Care Team: Jeffery Hams, FNP as PCP - General (Family Medicine)  CHIEF COMPLAINTS/PURPOSE OF CONSULTATION:  F/u for FL  HISTORY OF PRESENTING ILLNESS:   Jeffery Wilson is a wonderful 69 y.o. male who has been referred to [place] by Dr. Helane Wilson for evaluation and management of his concern for a Lymphoproliferative process. He is accompanied today by his daughter and wife. The pt reports that he is doing well overall.   The pt reports that he first noticed the left jaw mass two years ago which has slowly grown. 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 The pt notes that his PCP Dr. Lavone Wilson noticed the mass in July/August at a wellness visit and he was subsequently referred to ENT Dr. Fenton Wilson at [facility]. The pt denies pain, discomfort, fevers, chills, night sweats, unexpected weight loss, and any other lumps or bumps. He notes that he is not feeling any differently recently as compared to the last 6 months to a year.   The pt also notes that his right lower leg is also larger than his left, and has been this way all of his life. He notes that his ankle swelling is stable and has varicose veins.   The pt notes hammer toe surgery and arthroscopic left knee surgery. He notes that he was previously diagnosed with CKD but has managed this well and notes his creatinine has nearly normalized. He also notes that has hypertension.   The pt notes that he had Prevnar and Pneumovax last year and regularly receives his annual flu vaccine.   Of note prior to the patient's visit today, pt has had a Submandibular gland biopsy completed on 01/28/18 with results revealing concern for a lymphoproliferative process.   The pt also had a 01/15/18 CT Neck which revealed 3.3 x 4.1 x 5 cm solid LEFT  submandibular gland mass. Differential diagnosis includes benign mixed tumor or carcinoma. 2. Prominent though not pathologically enlarged LEFT lymph nodes potentially metastatic if tumor is malignant. 3. For above findings, recommend ENT consultation and histopathologic correlation.  Most recent lab results (01/28/18) of CBC is as follows: all values are WNL.  On review of systems, pt reports left jaw mass, stable energy levels, moving his bowels well, stable ankle swelling, and denies fevers, chills, night sweats, painful jaw mass, discomfort at jaw mass, unexpected weight loss, problems swallowing, pain along the spine, itching, back pain, new fatigue, abdominal pains, problems passing urine, and any other symptoms.   On PMHx the pt reports CKD, HTN, left knee arthroscopic knee surgery in 2016. 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 The pt smokes one cigarette every morning. He drinks 2-3 drinks of whiskey most afternoons, 4-6 on the weekends.  On Family Hx the pt reports father with MI at age 64. Denies blood disorders or cancer.   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 We are joined today by his wife, Jeffery Wilson. The patient's last visit with [place] was on 02/12/2019. The pt reports that he is doing well overall.  The pt reports that he has noticed a lump on the left side of his neck. The lump is not new or growing.  He has not noticed any other new lumps/bumps. Pt has had both doses of the COVID19 vaccine and denies any concerns or symptoms. His wife notes that in the interim his left foot became swollen and painful which has resolved after some time, Epsom Salt, and warm compress. He has previously been checked for Gout, but did not have it. 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 He does not want one at this time. His other chronic conditions  have been stable. Pt has spoken to a Nephrologist who advised pt that his CKD is driven by his HTN. Pt has a broken tooth along his left jaw. He has not been experiencing pain from this tooth.   Lab results today (08/13/19) of CBC w/diff and CMP is as follows: all values are WNL except for Glucose at 111, Creatinine at 1.55, GFR Est Non Af Am at 45. 08/13/2019 LDH at 190  On review of systems, pt reports knee pain and denies fevers, chills, night sweats, unexpected weight loss, new lumps/bumps, tooth/gum pain and any other symptoms.   MEDICAL HISTORY:  Past Medical History:  Diagnosis Date  . Hypertension   . Renal disorder    pt says stage 3 kidney dz     SURGICAL HISTORY: Past Surgical History:  Procedure Laterality Date  . HAMMER TOE SURGERY    . KNEE SURGERY      SOCIAL HISTORY: Social History   Socioeconomic History  . Marital status: Married    Spouse name: Not on file  . Number of children: Not on file  . Years of education: Not on file  . Highest education level: Not on file  Occupational History  . Not on file  Tobacco Use  . Smoking status: Never Smoker  . Smokeless tobacco: Never Used  Substance and Sexual Activity  . Alcohol use: Yes    Comment: daily   . Drug use: No  . Sexual activity: Not on file  Other Topics Concern  . Not on file  Social History Narrative  . Not on file   Social Determinants of Health   Financial Resource Strain:   . Difficulty of Paying Living Expenses: Not on file  Food Insecurity:   . Worried About Charity fundraiser in the Last Year: Not on file  . Ran Out of Food in the Last Year: Not on file  Transportation Needs:   . Lack of Transportation (Medical): Not on file  . Lack of Transportation (Non-Medical): Not on file  Physical Activity:   . Days of Exercise per Week: Not on file  . Minutes of Exercise per Session: Not on file  Stress:   . Feeling of Stress : Not on file  Social Connections:   . Frequency of  Communication with Friends and Family: Not on file  . Frequency of Social Gatherings with Friends and Family: Not on file  . Attends Religious Services: Not on file  . Active Member of Clubs or Organizations: Not on file  . Attends Archivist Meetings: Not on file  . Marital Status: Not on file  Intimate Partner Violence:   . Fear of Current or Ex-Partner: Not on file  . Emotionally Abused: Not on file  . Physically Abused: Not on file  . Sexually Abused: Not on file    FAMILY HISTORY: No family history on file.  ALLERGIES:  has No Known Allergies.  MEDICATIONS:  Current Outpatient Medications  Medication  Sig Dispense Refill  . acetaminophen (TYLENOL) 500 MG tablet Take 1,000 mg by mouth every 6 (six) hours as needed for moderate pain or headache.    . Artificial Tear Solution (GENTEAL TEARS OP) Place 1 drop into both eyes at bedtime.    Beryl Meager Tar Extract [phone] PSORIASIS MEDICATED EX) Apply 1 application topically daily.    . furosemide (LASIX) 40 MG tablet Take 40 mg by mouth daily.     . metoprolol succinate (TOPROL-XL) 50 MG 24 hr tablet Take 50 mg by mouth daily. Take with or immediately following a meal.    . Milk Thistle 1000 MG CAPS Take 1 tablet by mouth.    . Multiple Vitamins-Minerals (MULTIVITAMIN PO) Take 1 tablet by mouth daily.    . Omega-3 Fatty Acids (OMEGA 3 PO) Take 1 capsule by mouth 2 (two) times daily.    . pravastatin (PRAVACHOL) 20 MG tablet Take 20 mg by mouth at bedtime.     . Turmeric 500 MG CAPS Take 500 mg by mouth daily.    . chlorhexidine (PERIDEX) 0.12 % solution Use as directed 15 mLs in the mouth or throat 2 (two) times daily. 473 mL 1  . dexamethasone (DECADRON) 4 MG tablet Take 2 tablets (8 mg total) by mouth once as needed for up to 1 dose (8mg  once with lunch on day before each Rituxan dose). (Patient not taking: Reported on 08/13/2019) 10 tablet 0  . dicyclomine (BENTYL) 20 MG tablet Take 1 tablet (20 mg total) by mouth 3 (three) times  daily before meals. As needed for abdominal cramping (Patient not taking: Reported on 05/25/2018) 20 tablet 0  . ondansetron (ZOFRAN ODT) 4 MG disintegrating tablet Take 1 tablet (4 mg total) by mouth every 8 (eight) hours as needed for nausea or vomiting. (Patient not taking: Reported on 01/20/2018) 20 tablet 0   No current facility-administered medications for this visit.    REVIEW OF SYSTEMS:   A 10+ POINT REVIEW OF SYSTEMS WAS OBTAINED including neurology, dermatology, psychiatry, cardiac, respiratory, lymph, extremities, GI, GU, Musculoskeletal, constitutional, breasts, reproductive, HEENT.  All pertinent positives are noted in the HPI.  All others are negative.    PHYSICAL EXAMINATION: ECOG PERFORMANCE STATUS: 1 - Symptomatic but completely ambulatory  Vitals:   08/13/19 1100  BP: (!) 145/91  Pulse: 89  Resp: 18  Temp: 98.7 F (37.1 C)  SpO2: 100%   Filed Weights   08/13/19 1100  Weight: 278 lb 3.2 oz (126.2 kg)   .Body mass index is 38.8 kg/m.  GENERAL:alert, in no acute distress and comfortable SKIN: no acute rashes, no significant lesions EYES: conjunctiva are pink and non-injected, sclera anicteric OROPHARYNX: MMM, no exudates, no oropharyngeal erythema or ulceration NECK: supple, no JVD, bogginess to palpation over the left angle of the jaw, no discrete cervical lymph node  LYMPH:  no palpable lymphadenopathy in the cervical, axillary or inguinal regions LUNGS: clear to auscultation b/l with normal respiratory effort HEART: regular rate & rhythm ABDOMEN:  normoactive bowel sounds , non tender, not distended. No palpable hepatosplenomegaly.  Extremity: trace pedal edema b/l PSYCH: alert & oriented x 3 with fluent speech NEURO: no focal motor/sensory deficits  LABORATORY DATA:  I have reviewed the data as listed  . CBC Latest Ref Rng & Units 08/13/2019 02/12/2019 10/13/2018  WBC 4.0 - 10.5 K/uL 5.9 6.0 5.3  Hemoglobin 13.0 - 17.0 g/dL 14.8 15.9 15.7  Hematocrit  39.0 - 52.0 % 43.8 47.4 48.2  Platelets 150 -  400 K/uL 229 194 206    . CMP Latest Ref Rng & Units 08/13/2019 02/12/2019 10/13/2018  Glucose 70 - 99 mg/dL 111(H) 106(H) 110(H)  BUN 8 - 23 mg/dL 21 21 14   Creatinine 0.61 - 1.24 mg/dL 1.55(H) 1.47(H) 1.50(H)  Sodium 135 - 145 mmol/L 140 138 140  Potassium 3.5 - 5.1 mmol/L 4.2 4.4 4.4  Chloride 98 - 111 mmol/L 101 101 101  CO2 22 - 32 mmol/L 28 26 30   Calcium 8.9 - 10.3 mg/dL 9.6 9.8 9.8  Total Protein 6.5 - 8.1 g/dL 7.6 7.8 7.7  Total Bilirubin 0.3 - 1.2 mg/dL 0.7 0.8 0.8  Alkaline Phos 38 - 126 U/L 64 59 59  AST 15 - 41 U/L 19 26 20   ALT 0 - 44 U/L 17 24 21    Component     Latest Ref Rng & Units 03/24/2018  Hepatitis B Surface Ag     Negative Negative  Hep B Core Ab, Tot     Negative Negative  HIV Screen 4th Generation wRfx     Non Reactive Non Reactive  HCV Ab     0.0 - 0.9 s/co ratio <0.1  LDH     98 - 192 U/L 139   . Lab Results  Component Value Date   LDH 190 08/13/2019    01/28/18 Submandibular gland biopsy:    RADIOGRAPHIC STUDIES: I have personally reviewed the radiological images as listed and agreed with the findings in the report. No results found.  ASSESSMENT & PLAN:   69 y.o. male with  1. 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 Additional nonenlarged lymph nodes in the left neck shows low level FDG accumulation, suspicious for tumor involvement. 2. Tiny right groin lymph node shows low level FDG uptake, indeterminate. Otherwise, no evidence for hypermetabolic disease in the chest,  abdomen, or pelvis. 3. Hepatic steatosis. 4. Cholelithiasis. 5. Colonic diverticulosis without diverticulitis.   04/24/18 Left cervical lymph node incisional biopsy revealed Follicular lymphoma, Grade 1-2 with a 10% proliferation rate   S/p 4 weekly cycles of Rituxan (limited rate) completed on 06/09/18  08/04/18 PET/CT revealed Response to therapy, as evidenced by mildly decreased size and hypermetabolism within a dominant left submandibular mass. The previously described left-sided cervical nodal hypermetabolism has resolved. 2. Decrease in right inguinal nodal hypermetabolism, favored to be physiologic or reactive. 3. No new or progressive disease. 4. Incidental findings, including cholelithiasis, left adrenal adenoma.  PLAN: -Discussed pt labwork today, 08/13/19; blood counts look nml, blood chemistries are stable -Discussed LDH is WNL -No clinical or lab evidence to indicate active Folicular lymphoma at this time.  -No indication to treat at this time -Recommend pt discuss Flexogenix gel with his PCP for knee pain -Recommend pt f/u with Dentist within 1 month for broken tooth -Advised pt to contact if jaw becomes swollen or he begins running a consistent fever - will consider antibiotics  -Rx Peridex -Will follow up in 6 months with labs    FOLLOW UP: RTC with Dr [name] with labs in 6 months Dental f/u in 1  month (patient will arrange)   The total time spent in the appt was 20 minutes and more than 50% was on counseling and direct patient cares.  All of the patient's questions were answered with apparent satisfaction. The patient knows to call the clinic with any problems, questions or concerns.     Sullivan Lone MD [place] AAHIVMS [facility] Hematology/Oncology Physician [facility]   (Office):       [phone] (Work cell):  [phone] (Fax):           [phone]  08/13/2019 1:09 PM  I, Yevette Edwards, am acting as a scribe for Dr. Sullivan Lone.   .I have  reviewed the above documentation for accuracy and completeness, and I agree with the above. Brunetta Genera MD

## 2019-08-13 ENCOUNTER — Other Ambulatory Visit: Payer: Self-pay

## 2019-08-13 ENCOUNTER — Inpatient Hospital Stay (HOSPITAL_BASED_OUTPATIENT_CLINIC_OR_DEPARTMENT_OTHER): Payer: Medicare HMO | Admitting: Hematology

## 2019-08-13 ENCOUNTER — Inpatient Hospital Stay: Payer: Medicare HMO | Attending: Hematology

## 2019-08-13 VITALS — BP 145/91 | HR 89 | Temp 98.7°F | Resp 18 | Ht 71.0 in | Wt 278.2 lb

## 2019-08-13 DIAGNOSIS — F17210 Nicotine dependence, cigarettes, uncomplicated: Secondary | ICD-10-CM | POA: Diagnosis not present

## 2019-08-13 DIAGNOSIS — C8211 Follicular lymphoma grade II, lymph nodes of head, face, and neck: Secondary | ICD-10-CM

## 2019-08-13 DIAGNOSIS — I10 Essential (primary) hypertension: Secondary | ICD-10-CM | POA: Insufficient documentation

## 2019-08-13 DIAGNOSIS — Z9221 Personal history of antineoplastic chemotherapy: Secondary | ICD-10-CM | POA: Diagnosis not present

## 2019-08-13 DIAGNOSIS — N183 Chronic kidney disease, stage 3 unspecified: Secondary | ICD-10-CM | POA: Insufficient documentation

## 2019-08-13 DIAGNOSIS — Z8572 Personal history of non-Hodgkin lymphomas: Secondary | ICD-10-CM | POA: Diagnosis not present

## 2019-08-13 DIAGNOSIS — Z79899 Other long term (current) drug therapy: Secondary | ICD-10-CM | POA: Insufficient documentation

## 2019-08-13 LAB — CBC WITH DIFFERENTIAL/PLATELET
Abs Immature Granulocytes: 0.02 10*3/uL (ref 0.00–0.07)
Basophils Absolute: 0 10*3/uL (ref 0.0–0.1)
Basophils Relative: 1 %
Eosinophils Absolute: 0.1 10*3/uL (ref 0.0–0.5)
Eosinophils Relative: 2 %
HCT: 43.8 % (ref 39.0–52.0)
Hemoglobin: 14.8 g/dL (ref 13.0–17.0)
Immature Granulocytes: 0 %
Lymphocytes Relative: 25 %
Lymphs Abs: 1.5 10*3/uL (ref 0.7–4.0)
MCH: 33.4 pg (ref 26.0–34.0)
MCHC: 33.8 g/dL (ref 30.0–36.0)
MCV: 98.9 fL (ref 80.0–100.0)
Monocytes Absolute: 0.6 10*3/uL (ref 0.1–1.0)
Monocytes Relative: 11 %
Neutro Abs: 3.6 10*3/uL (ref 1.7–7.7)
Neutrophils Relative %: 61 %
Platelets: 229 10*3/uL (ref 150–400)
RBC: 4.43 MIL/uL (ref 4.22–5.81)
RDW: 13.2 % (ref 11.5–15.5)
WBC: 5.9 10*3/uL (ref 4.0–10.5)
nRBC: 0 % (ref 0.0–0.2)

## 2019-08-13 LAB — LACTATE DEHYDROGENASE: LDH: 190 U/L (ref 98–192)

## 2019-08-13 LAB — CMP (CANCER CENTER ONLY)
ALT: 17 U/L (ref 0–44)
AST: 19 U/L (ref 15–41)
Albumin: 4.2 g/dL (ref 3.5–5.0)
Alkaline Phosphatase: 64 U/L (ref 38–126)
Anion gap: 11 (ref 5–15)
BUN: 21 mg/dL (ref 8–23)
CO2: 28 mmol/L (ref 22–32)
Calcium: 9.6 mg/dL (ref 8.9–10.3)
Chloride: 101 mmol/L (ref 98–111)
Creatinine: 1.55 mg/dL — ABNORMAL HIGH (ref 0.61–1.24)
GFR, Est AFR Am: 52 mL/min — ABNORMAL LOW (ref >60)
GFR, Estimated: 45 mL/min — ABNORMAL LOW (ref >60)
Glucose, Bld: 111 mg/dL — ABNORMAL HIGH (ref 70–99)
Potassium: 4.2 mmol/L (ref 3.5–5.1)
Sodium: 140 mmol/L (ref 135–145)
Total Bilirubin: 0.7 mg/dL (ref 0.3–1.2)
Total Protein: 7.6 g/dL (ref 6.5–8.1)

## 2019-08-13 MED ORDER — CHLORHEXIDINE GLUCONATE 0.12 % MT SOLN: 15.0000 mL | Freq: Two times a day (BID) | OROMUCOSAL | 1 refills | Status: DC

## 2019-09-18 ENCOUNTER — Other Ambulatory Visit: Payer: Self-pay | Admitting: Hematology

## 2020-01-28 ENCOUNTER — Telehealth: Payer: Self-pay | Admitting: Hematology

## 2020-01-28 NOTE — Telephone Encounter (Signed)
Rescheduled 08/20 to 09/13, patient has been called and notified.

## 2020-02-11 ENCOUNTER — Other Ambulatory Visit: Payer: Medicare HMO

## 2020-02-11 ENCOUNTER — Ambulatory Visit: Payer: Medicare HMO | Admitting: Hematology

## 2020-03-06 ENCOUNTER — Ambulatory Visit: Payer: Medicare HMO | Admitting: Hematology

## 2020-03-06 ENCOUNTER — Inpatient Hospital Stay: Payer: Medicare HMO | Attending: Hematology

## 2020-03-06 ENCOUNTER — Inpatient Hospital Stay (HOSPITAL_BASED_OUTPATIENT_CLINIC_OR_DEPARTMENT_OTHER): Payer: Medicare HMO | Admitting: Hematology

## 2020-03-06 ENCOUNTER — Inpatient Hospital Stay: Payer: Medicare HMO

## 2020-03-06 ENCOUNTER — Telehealth: Payer: Self-pay | Admitting: Hematology

## 2020-03-06 ENCOUNTER — Other Ambulatory Visit: Payer: Self-pay

## 2020-03-06 ENCOUNTER — Other Ambulatory Visit: Payer: Medicare HMO

## 2020-03-06 VITALS — BP 147/78 | HR 60 | Temp 97.3°F | Resp 18 | Ht 71.0 in | Wt 270.0 lb

## 2020-03-06 DIAGNOSIS — Z23 Encounter for immunization: Secondary | ICD-10-CM

## 2020-03-06 DIAGNOSIS — C8211 Follicular lymphoma grade II, lymph nodes of head, face, and neck: Secondary | ICD-10-CM | POA: Diagnosis not present

## 2020-03-06 DIAGNOSIS — N183 Chronic kidney disease, stage 3 unspecified: Secondary | ICD-10-CM | POA: Insufficient documentation

## 2020-03-06 DIAGNOSIS — Z8572 Personal history of non-Hodgkin lymphomas: Secondary | ICD-10-CM | POA: Insufficient documentation

## 2020-03-06 DIAGNOSIS — I10 Essential (primary) hypertension: Secondary | ICD-10-CM | POA: Insufficient documentation

## 2020-03-06 DIAGNOSIS — Z79899 Other long term (current) drug therapy: Secondary | ICD-10-CM | POA: Diagnosis not present

## 2020-03-06 DIAGNOSIS — Z9221 Personal history of antineoplastic chemotherapy: Secondary | ICD-10-CM | POA: Diagnosis not present

## 2020-03-06 LAB — CBC WITH DIFFERENTIAL/PLATELET
Abs Immature Granulocytes: 0.03 10*3/uL (ref 0.00–0.07)
Basophils Absolute: 0.1 10*3/uL (ref 0.0–0.1)
Basophils Relative: 1 %
Eosinophils Absolute: 0.1 10*3/uL (ref 0.0–0.5)
Eosinophils Relative: 3 %
HCT: 46.4 % (ref 39.0–52.0)
Hemoglobin: 15.9 g/dL (ref 13.0–17.0)
Immature Granulocytes: 1 %
Lymphocytes Relative: 28 %
Lymphs Abs: 1.5 10*3/uL (ref 0.7–4.0)
MCH: 33.1 pg (ref 26.0–34.0)
MCHC: 34.3 g/dL (ref 30.0–36.0)
MCV: 96.7 fL (ref 80.0–100.0)
Monocytes Absolute: 0.6 10*3/uL (ref 0.1–1.0)
Monocytes Relative: 11 %
Neutro Abs: 3 10*3/uL (ref 1.7–7.7)
Neutrophils Relative %: 56 %
Platelets: 205 10*3/uL (ref 150–400)
RBC: 4.8 MIL/uL (ref 4.22–5.81)
RDW: 13 % (ref 11.5–15.5)
WBC: 5.3 10*3/uL (ref 4.0–10.5)
nRBC: 0 % (ref 0.0–0.2)

## 2020-03-06 LAB — CMP (CANCER CENTER ONLY)
ALT: 21 U/L (ref 0–44)
AST: 23 U/L (ref 15–41)
Albumin: 4.4 g/dL (ref 3.5–5.0)
Alkaline Phosphatase: 57 U/L (ref 38–126)
Anion gap: 8 (ref 5–15)
BUN: 20 mg/dL (ref 8–23)
CO2: 27 mmol/L (ref 22–32)
Calcium: 9.8 mg/dL (ref 8.9–10.3)
Chloride: 101 mmol/L (ref 98–111)
Creatinine: 1.43 mg/dL — ABNORMAL HIGH (ref 0.61–1.24)
GFR, Est AFR Am: 57 mL/min — ABNORMAL LOW (ref >60)
GFR, Estimated: 49 mL/min — ABNORMAL LOW (ref >60)
Glucose, Bld: 109 mg/dL — ABNORMAL HIGH (ref 70–99)
Potassium: 3.9 mmol/L (ref 3.5–5.1)
Sodium: 136 mmol/L (ref 135–145)
Total Bilirubin: 1 mg/dL (ref 0.3–1.2)
Total Protein: 7.8 g/dL (ref 6.5–8.1)

## 2020-03-06 LAB — LACTATE DEHYDROGENASE: LDH: 155 U/L (ref 98–192)

## 2020-03-06 MED ORDER — PREDNISONE 20 MG PO TABS: 20.0000 mg | ORAL_TABLET | Freq: Every day | ORAL | 0 refills | Status: AC

## 2020-03-06 MED ORDER — AMOXICILLIN-POT CLAVULANATE 875-125 MG PO TABS: 1.0000 | ORAL_TABLET | Freq: Two times a day (BID) | ORAL | 0 refills | Status: AC

## 2020-03-06 NOTE — Progress Notes (Signed)
HEMATOLOGY/ONCOLOGY CLINIC NOTE  Date of Service: 03/06/2020  Patient Care Team: Gregor Hams, FNP as PCP - General (Family Medicine)  CHIEF COMPLAINTS/PURPOSE OF CONSULTATION:  F/u for FL  HISTORY OF PRESENTING ILLNESS:   Jeffery Wilson is a wonderful 69 y.o. male who has been referred to [place] by Dr. Helane Rima for evaluation and management of his concern for a Lymphoproliferative process. He is accompanied today by his daughter and wife. The pt reports that he is doing well overall.   The pt reports that he first noticed the left jaw mass two years ago which has slowly grown. 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 The pt notes that his PCP Dr. Lavone Neri noticed the mass in July/August at a wellness visit and he was subsequently referred to ENT Dr. Fenton Malling at [facility]. The pt denies pain, discomfort, fevers, chills, night sweats, unexpected weight loss, and any other lumps or bumps. He notes that he is not feeling any differently recently as compared to the last 6 months to a year.   The pt also notes that his right lower leg is also larger than his left, and has been this way all of his life. He notes that his ankle swelling is stable and has varicose veins.   The pt notes hammer toe surgery and arthroscopic left knee surgery. He notes that he was previously diagnosed with CKD but has managed this well and notes his creatinine has nearly normalized. He also notes that has hypertension.   The pt notes that he had Prevnar and Pneumovax last year and regularly receives his annual flu vaccine.   Of note prior to the patient's visit today, pt has had a Submandibular gland biopsy completed on 01/28/18 with results revealing concern for a lymphoproliferative process.   The pt also had a 01/15/18 CT Neck which revealed 3.3 x 4.1 x 5 cm solid LEFT  submandibular gland mass. Differential diagnosis includes benign mixed tumor or carcinoma. 2. Prominent though not pathologically enlarged LEFT lymph nodes potentially metastatic if tumor is malignant. 3. For above findings, recommend ENT consultation and histopathologic correlation.  Most recent lab results (01/28/18) of CBC is as follows: all values are WNL.  On review of systems, pt reports left jaw mass, stable energy levels, moving his bowels well, stable ankle swelling, and denies fevers, chills, night sweats, painful jaw mass, discomfort at jaw mass, unexpected weight loss, problems swallowing, pain along the spine, itching, back pain, new fatigue, abdominal pains, problems passing urine, and any other symptoms.   On PMHx the pt reports CKD, HTN, left knee arthroscopic knee surgery in 2016. 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 The pt smokes one cigarette every morning. He drinks 2-3 drinks of whiskey most afternoons, 4-6 on the weekends.  On Family Hx the pt reports father with MI at age 57. Denies blood disorders or cancer.   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 We are joined today by his wife. The patient's last visit with [place] was on 08/13/2019. The pt reports that he is doing well overall.  The pt reports that he has dental issues that need to be resolved. Pt has noticed an increase in the size of the lymph node along  his left jaw. It has fluctuated in size over the last few months, but has been more noticeable in the last 10 days. He denies any other recent symptoms. Pt has also been experiencing a post-nasal drip and a chronic cough.   Lab results today (03/06/20) of CBC w/diff and CMP is as follows: all values are WNL except for Glucose at 109, Creatinine at 143, GFR Est Non Af Am at 49. 03/06/2020 LDH at 155  On review of systems, pt reports cough, congestion, enlarging  cervical lymph node and denies back pain, fevers, chills, night sweats, unexpected weight loss, testicular pain/swelling  and any other symptoms.   MEDICAL HISTORY:  Past Medical History:  Diagnosis Date  . Hypertension   . Renal disorder    pt says stage 3 kidney dz     SURGICAL HISTORY: Past Surgical History:  Procedure Laterality Date  . HAMMER TOE SURGERY    . KNEE SURGERY      SOCIAL HISTORY: Social History   Socioeconomic History  . Marital status: Married    Spouse name: Not on file  . Number of children: Not on file  . Years of education: Not on file  . Highest education level: Not on file  Occupational History  . Not on file  Tobacco Use  . Smoking status: Never Smoker  . Smokeless tobacco: Never Used  Vaping Use  . Vaping Use: Never used  Substance and Sexual Activity  . Alcohol use: Yes    Comment: daily   . Drug use: No  . Sexual activity: Not on file  Other Topics Concern  . Not on file  Social History Narrative  . Not on file   Social Determinants of Health   Financial Resource Strain:   . Difficulty of Paying Living Expenses: Not on file  Food Insecurity:   . Worried About Charity fundraiser in the Last Year: Not on file  . Ran Out of Food in the Last Year: Not on file  Transportation Needs:   . Lack of Transportation (Medical): Not on file  . Lack of Transportation (Non-Medical): Not on file  Physical Activity:   . Days of Exercise per Week: Not on file  . Minutes of Exercise per Session: Not on file  Stress:   . Feeling of Stress : Not on file  Social Connections:   . Frequency of Communication with Friends and Family: Not on file  . Frequency of Social Gatherings with Friends and Family: Not on file  . Attends Religious Services: Not on file  . Active Member of Clubs or Organizations: Not on file  . Attends Archivist Meetings: Not on file  . Marital Status: Not on file  Intimate Partner Violence:   . Fear of Current or  Ex-Partner: Not on file  . Emotionally Abused: Not on file  . Physically Abused: Not on file  . Sexually Abused: Not on file    FAMILY HISTORY: No family history on file.  ALLERGIES:  has No Known Allergies.  MEDICATIONS:  Current Outpatient Medications  Medication Sig Dispense Refill  . acetaminophen (TYLENOL) 500 MG tablet Take 1,000 mg by mouth every 6 (six) hours as needed for moderate pain or headache.    Marland Kitchen amoxicillin-clavulanate (AUGMENTIN) 875-125 MG tablet Take 1 tablet by mouth 2 (two) times daily for 5 days. 10 tablet 0  . Artificial Tear Solution (GENTEAL TEARS OP) Place 1 drop into both eyes at bedtime.    . chlorhexidine (  PERIDEX) 0.12 % solution RINSE 15 ML IN MOUTH  TWICE DAILY AS DIRECTED 473 mL 0  . Coal Tar Extract [phone] PSORIASIS MEDICATED EX) Apply 1 application topically daily.    Marland Kitchen dexamethasone (DECADRON) 4 MG tablet Take 2 tablets (8 mg total) by mouth once as needed for up to 1 dose (8mg  once with lunch on day before each Rituxan dose). (Patient not taking: Reported on 08/13/2019) 10 tablet 0  . dicyclomine (BENTYL) 20 MG tablet Take 1 tablet (20 mg total) by mouth 3 (three) times daily before meals. As needed for abdominal cramping (Patient not taking: Reported on 05/25/2018) 20 tablet 0  . furosemide (LASIX) 40 MG tablet Take 40 mg by mouth daily.     . metoprolol succinate (TOPROL-XL) 50 MG 24 hr tablet Take 50 mg by mouth daily. Take with or immediately following a meal.    . Milk Thistle 1000 MG CAPS Take 1 tablet by mouth.    . Multiple Vitamins-Minerals (MULTIVITAMIN PO) Take 1 tablet by mouth daily.    . Omega-3 Fatty Acids (OMEGA 3 PO) Take 1 capsule by mouth 2 (two) times daily.    . ondansetron (ZOFRAN ODT) 4 MG disintegrating tablet Take 1 tablet (4 mg total) by mouth every 8 (eight) hours as needed for nausea or vomiting. (Patient not taking: Reported on 01/20/2018) 20 tablet 0  . pravastatin (PRAVACHOL) 20 MG tablet Take 20 mg by mouth at bedtime.      . predniSONE (DELTASONE) 20 MG tablet Take 1 tablet (20 mg total) by mouth daily with breakfast for 5 days. 5 tablet 0  . Turmeric 500 MG CAPS Take 500 mg by mouth daily.     No current facility-administered medications for this visit.    REVIEW OF SYSTEMS:   A 10+ POINT REVIEW OF SYSTEMS WAS OBTAINED including neurology, dermatology, psychiatry, cardiac, respiratory, lymph, extremities, GI, GU, Musculoskeletal, constitutional, breasts, reproductive, HEENT.  All pertinent positives are noted in the HPI.  All others are negative.   PHYSICAL EXAMINATION: ECOG PERFORMANCE STATUS: 1 - Symptomatic but completely ambulatory  Vitals:   03/06/20 1022  BP: (!) 147/78  Pulse: 60  Resp: 18  Temp: (!) 97.3 F (36.3 C)  SpO2: 99%   Filed Weights   03/06/20 1022  Weight: 270 lb (122.5 kg)   .Body mass index is 37.66 kg/m.   GENERAL:alert, in no acute distress and comfortable SKIN: no acute rashes, no significant lesions EYES: conjunctiva are pink and non-injected, sclera anicteric OROPHARYNX: MMM, no exudates, no oropharyngeal erythema or ulceration NECK: supple, no JVD LYMPH:  no palpable lymphadenopathy in the axillary or inguinal regions. Lymphadenopathy in the left angle of the jaw. LUNGS: clear to auscultation b/l with normal respiratory effort HEART: regular rate & rhythm ABDOMEN:  normoactive bowel sounds , non tender, not distended. No palpable hepatosplenomegaly.  Extremity: no pedal edema PSYCH: alert & oriented x 3 with fluent speech NEURO: no focal motor/sensory deficits  LABORATORY DATA:  I have reviewed the data as listed  . CBC Latest Ref Rng & Units 03/06/2020 08/13/2019 02/12/2019  WBC 4.0 - 10.5 K/uL 5.3 5.9 6.0  Hemoglobin 13.0 - 17.0 g/dL 15.9 14.8 15.9  Hematocrit 39 - 52 % 46.4 43.8 47.4  Platelets 150 - 400 K/uL 205 229 194    . CMP Latest Ref Rng & Units 03/06/2020 08/13/2019 02/12/2019  Glucose 70 - 99 mg/dL 109(H) 111(H) 106(H)  BUN 8 - 23 mg/dL 20 21 21    Creatinine 0.61 -  1.24 mg/dL 1.43(H) 1.55(H) 1.47(H)  Sodium 135 - 145 mmol/L 136 140 138  Potassium 3.5 - 5.1 mmol/L 3.9 4.2 4.4  Chloride 98 - 111 mmol/L 101 101 101  CO2 22 - 32 mmol/L 27 28 26   Calcium 8.9 - 10.3 mg/dL 9.8 9.6 9.8  Total Protein 6.5 - 8.1 g/dL 7.8 7.6 7.8  Total Bilirubin 0.3 - 1.2 mg/dL 1.0 0.7 0.8  Alkaline Phos 38 - 126 U/L 57 64 59  AST 15 - 41 U/L 23 19 26   ALT 0 - 44 U/L 21 17 24    Component     Latest Ref Rng & Units 03/24/2018  Hepatitis B Surface Ag     Negative Negative  Hep B Core Ab, Tot     Negative Negative  HIV Screen 4th Generation wRfx     Non Reactive Non Reactive  HCV Ab     0.0 - 0.9 s/co ratio <0.1  LDH     98 - 192 U/L 139   . Lab Results  Component Value Date   LDH 155 03/06/2020    01/28/18 Submandibular gland biopsy:    RADIOGRAPHIC STUDIES: I have personally reviewed the radiological images as listed and agreed with the findings in the report. No results found.  ASSESSMENT & PLAN:   69 y.o. male with  1. 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 Additional nonenlarged lymph nodes in the left neck shows low level FDG accumulation, suspicious for tumor involvement. 2. Tiny right groin lymph node shows low level FDG uptake, indeterminate. Otherwise, no evidence for hypermetabolic disease in the chest, abdomen, or pelvis. 3. Hepatic steatosis. 4. Cholelithiasis. 5. Colonic diverticulosis without diverticulitis.   04/24/18 Left cervical lymph node incisional biopsy revealed Follicular lymphoma, Grade 1-2 with a  10% proliferation rate   S/p 4 weekly cycles of Rituxan (limited rate) completed on 06/09/18  08/04/18 PET/CT revealed Response to therapy, as evidenced by mildly decreased size and hypermetabolism within a dominant left submandibular mass. The previously described left-sided cervical nodal hypermetabolism has resolved. 2. Decrease in right inguinal nodal hypermetabolism, favored to be physiologic or reactive. 3. No new or progressive disease. 4. Incidental findings, including cholelithiasis, left adrenal adenoma.  PLAN: -Discussed pt labwork today, 03/06/20; blood counts are nml, blood chemistries are steady, LDH is WNL -Pt is nearly two years from last treatment. No lab or clinic evidence of significant FL progression at this time. -Advised pt that the swelling of his lymph node in the left jaw could be caused by lymphoma or inflammation from infection. -Advised pt that Rituxan reduced his cancer burden, but was not curative treatment. His lymphoma will likely return.  -Advised pt that if his lymph node is not bothersome and he is not experiencing any other symptoms we would not treat at this time.  -Advised pt that if the lymph node is bothersome and is the only area of active disease we could consider localized Radiation to the area. -Advised pt that Radiation would require referral to Radiation Oncology & clearance by a Dentist.  -Discussed completing a short-course  of antibiotics and Prednisone and watching as a conservative approach. Pt prefers this. -Recommend pt begin antibiotics today and begin Prednisone in two weeks. -Discussed CDC guidelines regarding COVID19 booster. Will give in clinic today. -Recommend pt wait two weeks between vaccinations.  -Recommend pt begin OTC probiotics or live cultured yogurt.  -Advised pt that long-term use of Peridex can cause the yellowing of teeth. -Rx Prednisone & antibiotics (for possible dental infection) -Will see back in 4 months with  labs   FOLLOW UP: Patient okay to receive covid booster vaccine today RTC with Dr [name] with labs in 4 months   The total time spent in the appt was 20 minutes and more than 50% was on counseling and direct patient cares.  All of the patient's questions were answered with apparent satisfaction. The patient knows to call the clinic with any problems, questions or concerns.    Sullivan Lone MD Calcium AAHIVMS [facility] Hematology/Oncology Physician [facility]   (Office):       [phone] (Work cell):  [phone] (Fax):           [phone]  03/06/2020 11:12 AM  I, Yevette Edwards, am acting as a scribe for Dr. Sullivan Lone.   .I have reviewed the above documentation for accuracy and completeness, and I agree with the above. Brunetta Genera MD

## 2020-03-06 NOTE — Progress Notes (Signed)
   Covid-19 Vaccination Clinic  Name:  Jeffery Wilson    MRN: [id number] DOB: [birth year]  03/06/2020  [name] was observed post Covid-19 immunization for 15 minutes without incident. He was provided with Vaccine Information Sheet and instruction to access the V-Safe system.   [name] was instructed to call 911 with any severe reactions post vaccine: Marland Kitchen Difficulty breathing  . Swelling of face and throat  . A fast heartbeat  . A bad rash all over body  . Dizziness and weakness

## 2020-03-06 NOTE — Telephone Encounter (Signed)
Scheduled per los. Gave avs and calendar  

## 2020-07-04 ENCOUNTER — Inpatient Hospital Stay: Payer: Medicare HMO

## 2020-07-04 ENCOUNTER — Telehealth: Payer: Self-pay | Admitting: Hematology

## 2020-07-04 ENCOUNTER — Other Ambulatory Visit: Payer: Self-pay

## 2020-07-04 ENCOUNTER — Inpatient Hospital Stay: Payer: Medicare HMO | Attending: Hematology | Admitting: Hematology

## 2020-07-04 VITALS — BP 159/87 | HR 62 | Temp 97.0°F | Resp 20 | Ht 71.0 in | Wt 264.7 lb

## 2020-07-04 DIAGNOSIS — C8201 Follicular lymphoma grade I, lymph nodes of head, face, and neck: Secondary | ICD-10-CM | POA: Diagnosis not present

## 2020-07-04 DIAGNOSIS — Z9221 Personal history of antineoplastic chemotherapy: Secondary | ICD-10-CM | POA: Diagnosis not present

## 2020-07-04 DIAGNOSIS — I10 Essential (primary) hypertension: Secondary | ICD-10-CM | POA: Insufficient documentation

## 2020-07-04 DIAGNOSIS — C8211 Follicular lymphoma grade II, lymph nodes of head, face, and neck: Secondary | ICD-10-CM

## 2020-07-04 DIAGNOSIS — Z79899 Other long term (current) drug therapy: Secondary | ICD-10-CM | POA: Insufficient documentation

## 2020-07-04 LAB — CMP (CANCER CENTER ONLY)
ALT: 23 U/L (ref 0–44)
AST: 21 U/L (ref 15–41)
Albumin: 4.3 g/dL (ref 3.5–5.0)
Alkaline Phosphatase: 57 U/L (ref 38–126)
Anion gap: 10 (ref 5–15)
BUN: 22 mg/dL (ref 8–23)
CO2: 28 mmol/L (ref 22–32)
Calcium: 9.9 mg/dL (ref 8.9–10.3)
Chloride: 102 mmol/L (ref 98–111)
Creatinine: 1.56 mg/dL — ABNORMAL HIGH (ref 0.61–1.24)
GFR, Estimated: 47 mL/min — ABNORMAL LOW (ref >60)
Glucose, Bld: 116 mg/dL — ABNORMAL HIGH (ref 70–99)
Potassium: 4.4 mmol/L (ref 3.5–5.1)
Sodium: 140 mmol/L (ref 135–145)
Total Bilirubin: 0.8 mg/dL (ref 0.3–1.2)
Total Protein: 7.8 g/dL (ref 6.5–8.1)

## 2020-07-04 LAB — CBC WITH DIFFERENTIAL/PLATELET
Abs Immature Granulocytes: 0.02 10*3/uL (ref 0.00–0.07)
Basophils Absolute: 0 10*3/uL (ref 0.0–0.1)
Basophils Relative: 1 %
Eosinophils Absolute: 0.1 10*3/uL (ref 0.0–0.5)
Eosinophils Relative: 2 %
HCT: 46.8 % (ref 39.0–52.0)
Hemoglobin: 15.7 g/dL (ref 13.0–17.0)
Immature Granulocytes: 0 %
Lymphocytes Relative: 29 %
Lymphs Abs: 1.4 10*3/uL (ref 0.7–4.0)
MCH: 32.8 pg (ref 26.0–34.0)
MCHC: 33.5 g/dL (ref 30.0–36.0)
MCV: 97.7 fL (ref 80.0–100.0)
Monocytes Absolute: 0.6 10*3/uL (ref 0.1–1.0)
Monocytes Relative: 11 %
Neutro Abs: 2.8 10*3/uL (ref 1.7–7.7)
Neutrophils Relative %: 57 %
Platelets: 204 10*3/uL (ref 150–400)
RBC: 4.79 MIL/uL (ref 4.22–5.81)
RDW: 13.5 % (ref 11.5–15.5)
WBC: 5 10*3/uL (ref 4.0–10.5)
nRBC: 0 % (ref 0.0–0.2)

## 2020-07-04 LAB — LACTATE DEHYDROGENASE: LDH: 149 U/L (ref 98–192)

## 2020-07-04 NOTE — Progress Notes (Signed)
HEMATOLOGY/ONCOLOGY CLINIC NOTE  Date of Service: 07/04/2020  Patient Care Team: Gregor Hams, FNP as PCP - General (Family Medicine)  CHIEF COMPLAINTS/PURPOSE OF CONSULTATION:  F/u for FL  HISTORY OF PRESENTING ILLNESS:   Jeffery Wilson is a wonderful 70 y.o. male who has been referred to [place] by Dr. Helane Rima for evaluation and management of his concern for a Lymphoproliferative process. He is accompanied today by his daughter and wife. The pt reports that he is doing well overall.   The pt reports that he first noticed the left jaw mass two years ago which has slowly grown. 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 The pt notes that his PCP Dr. Lavone Neri noticed the mass in July/August at a wellness visit and he was subsequently referred to ENT Dr. Fenton Malling at [facility]. The pt denies pain, discomfort, fevers, chills, night sweats, unexpected weight loss, and any other lumps or bumps. He notes that he is not feeling any differently recently as compared to the last 6 months to a year.   The pt also notes that his right lower leg is also larger than his left, and has been this way all of his life. He notes that his ankle swelling is stable and has varicose veins.   The pt notes hammer toe surgery and arthroscopic left knee surgery. He notes that he was previously diagnosed with CKD but has managed this well and notes his creatinine has nearly normalized. He also notes that has hypertension.   The pt notes that he had Prevnar and Pneumovax last year and regularly receives his annual flu vaccine.   Of note prior to the patient's visit today, pt has had a Submandibular gland biopsy completed on 01/28/18 with results revealing concern for a lymphoproliferative process.   The pt also had a 01/15/18 CT Neck which revealed 3.3 x 4.1 x 5 cm solid LEFT  submandibular gland mass. Differential diagnosis includes benign mixed tumor or carcinoma. 2. Prominent though not pathologically enlarged LEFT lymph nodes potentially metastatic if tumor is malignant. 3. For above findings, recommend ENT consultation and histopathologic correlation.  Most recent lab results (01/28/18) of CBC is as follows: all values are WNL.  On review of systems, pt reports left jaw mass, stable energy levels, moving his bowels well, stable ankle swelling, and denies fevers, chills, night sweats, painful jaw mass, discomfort at jaw mass, unexpected weight loss, problems swallowing, pain along the spine, itching, back pain, new fatigue, abdominal pains, problems passing urine, and any other symptoms.   On PMHx the pt reports CKD, HTN, left knee arthroscopic knee surgery in 2016. 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 The pt smokes one cigarette every morning. He drinks 2-3 drinks of whiskey most afternoons, 4-6 on the weekends.  On Family Hx the pt reports father with MI at age 74. Denies blood disorders or cancer.   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 The patient's last visit with [place] was on 03/06/2020. The pt reports that he is doing well overall.  The pt reports his left submandibular LNadenopathy add shrunk with steroids but has since grown back again is getting larger. No other acute new focal symptoms.  NO fevers/chills/night sweats or unexpected  weight loss  Lab results today (07/04/20) reviewed with patient.  MEDICAL HISTORY:  Past Medical History:  Diagnosis Date  . Hypertension   . Renal disorder    pt says stage 3 kidney dz     SURGICAL HISTORY: Past Surgical History:  Procedure Laterality Date  . HAMMER TOE SURGERY    . KNEE SURGERY      SOCIAL HISTORY: Social History   Socioeconomic History  . Marital status: Married    Spouse name:  Not on file  . Number of children: Not on file  . Years of education: Not on file  . Highest education level: Not on file  Occupational History  . Not on file  Tobacco Use  . Smoking status: Never Smoker  . Smokeless tobacco: Never Used  Vaping Use  . Vaping Use: Never used  Substance and Sexual Activity  . Alcohol use: Yes    Comment: daily   . Drug use: No  . Sexual activity: Not on file  Other Topics Concern  . Not on file  Social History Narrative  . Not on file   Social Determinants of Health   Financial Resource Strain: Not on file  Food Insecurity: Not on file  Transportation Needs: Not on file  Physical Activity: Not on file  Stress: Not on file  Social Connections: Not on file  Intimate Partner Violence: Not on file    FAMILY HISTORY: No family history on file.  ALLERGIES:  has No Known Allergies.  MEDICATIONS:  Current Outpatient Medications  Medication Sig Dispense Refill  . acetaminophen (TYLENOL) 500 MG tablet Take 1,000 mg by mouth every 6 (six) hours as needed for moderate pain or headache.    . Artificial Tear Solution (GENTEAL TEARS OP) Place 1 drop into both eyes at bedtime.    . chlorhexidine (PERIDEX) 0.12 % solution RINSE 15 ML IN MOUTH  TWICE DAILY AS DIRECTED 473 mL 0  . Coal Tar Extract [phone] PSORIASIS MEDICATED EX) Apply 1 application topically daily.    Marland Kitchen dexamethasone (DECADRON) 4 MG tablet Take 2 tablets (8 mg total) by mouth once as needed for up to 1 dose (8mg  once with lunch on day before each Rituxan dose). (Patient not taking: Reported on 08/13/2019) 10 tablet 0  . dicyclomine (BENTYL) 20 MG tablet Take 1 tablet (20 mg total) by mouth 3 (three) times daily before meals. As needed for abdominal cramping (Patient not taking: Reported on 05/25/2018) 20 tablet 0  . furosemide (LASIX) 40 MG tablet Take 40 mg by mouth daily.     . metoprolol succinate (TOPROL-XL) 50 MG 24 hr tablet Take 50 mg by mouth daily. Take with or immediately following a  meal.    . Milk Thistle 1000 MG CAPS Take 1 tablet by mouth.    . Multiple Vitamins-Minerals (MULTIVITAMIN PO) Take 1 tablet by mouth daily.    . Omega-3 Fatty Acids (OMEGA 3 PO) Take 1 capsule by mouth 2 (two) times daily.    . ondansetron (ZOFRAN ODT) 4 MG disintegrating tablet Take 1 tablet (4 mg total) by mouth every 8 (eight) hours as needed for nausea or vomiting. (Patient not taking: Reported on 01/20/2018) 20 tablet 0  . pravastatin (PRAVACHOL) 20 MG tablet Take 20 mg by mouth at bedtime.     . Turmeric 500 MG CAPS Take 500 mg by mouth daily.     No current facility-administered medications for this visit.    REVIEW OF SYSTEMS:   A 10+ POINT  REVIEW OF SYSTEMS WAS OBTAINED including neurology, dermatology, psychiatry, cardiac, respiratory, lymph, extremities, GI, GU, Musculoskeletal, constitutional, breasts, reproductive, HEENT.  All pertinent positives are noted in the HPI.  All others are negative.   PHYSICAL EXAMINATION: ECOG PERFORMANCE STATUS: 1 - Symptomatic but completely ambulatory  There were no vitals filed for this visit. There were no vitals filed for this visit. .There is no height or weight on file to calculate BMI.   NAD GENERAL:alert, in no acute distress and comfortable SKIN: no acute rashes, no significant lesions EYES: conjunctiva are pink and non-injected, sclera anicteric OROPHARYNX: MMM, no exudates, no oropharyngeal erythema or ulceration NECK: supple, no JVD LYMPH:  no palpable lymphadenopathy in the cervical, axillary or inguinal regions LUNGS: clear to auscultation b/l with normal respiratory effort HEART: regular rate & rhythm ABDOMEN:  normoactive bowel sounds , non tender, not distended. No palpable hepatosplenomegaly.  Extremity: no pedal edema PSYCH: alert & oriented x 3 with fluent speech NEURO: no focal motor/sensory deficits  LABORATORY DATA:  I have reviewed the data as listed  . CBC Latest Ref Rng & Units 03/06/2020 08/13/2019 02/12/2019   WBC 4.0 - 10.5 K/uL 5.3 5.9 6.0  Hemoglobin 13.0 - 17.0 g/dL 15.9 14.8 15.9  Hematocrit 39.0 - 52.0 % 46.4 43.8 47.4  Platelets 150 - 400 K/uL 205 229 194    . CMP Latest Ref Rng & Units 03/06/2020 08/13/2019 02/12/2019  Glucose 70 - 99 mg/dL 109(H) 111(H) 106(H)  BUN 8 - 23 mg/dL 20 21 21   Creatinine 0.61 - 1.24 mg/dL 1.43(H) 1.55(H) 1.47(H)  Sodium 135 - 145 mmol/L 136 140 138  Potassium 3.5 - 5.1 mmol/L 3.9 4.2 4.4  Chloride 98 - 111 mmol/L 101 101 101  CO2 22 - 32 mmol/L 27 28 26   Calcium 8.9 - 10.3 mg/dL 9.8 9.6 9.8  Total Protein 6.5 - 8.1 g/dL 7.8 7.6 7.8  Total Bilirubin 0.3 - 1.2 mg/dL 1.0 0.7 0.8  Alkaline Phos 38 - 126 U/L 57 64 59  AST 15 - 41 U/L 23 19 26   ALT 0 - 44 U/L 21 17 24    Component     Latest Ref Rng & Units 03/24/2018  Hepatitis B Surface Ag     Negative Negative  Hep B Core Ab, Tot     Negative Negative  HIV Screen 4th Generation wRfx     Non Reactive Non Reactive  HCV Ab     0.0 - 0.9 s/co ratio <0.1  LDH     98 - 192 U/L 139   . Lab Results  Component Value Date   LDH 155 03/06/2020    01/28/18 Submandibular gland biopsy:    RADIOGRAPHIC STUDIES: I have personally reviewed the radiological images as listed and agreed with the findings in the report. No results found.  ASSESSMENT & PLAN:   70 y.o. male with  1. 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 Additional nonenlarged lymph nodes in the left neck shows low level FDG accumulation, suspicious for tumor involvement. 2. Tiny right groin lymph node shows low level  FDG  uptake, indeterminate. Otherwise, no evidence for hypermetabolic disease in the chest, abdomen, or pelvis. 3. Hepatic steatosis. 4. Cholelithiasis. 5. Colonic diverticulosis without diverticulitis.   04/24/18 Left cervical lymph node incisional biopsy revealed Follicular lymphoma, Grade 1-2 with a 10% proliferation rate   S/p 4 weekly cycles of Rituxan (limited rate) completed on 06/09/18  08/04/18 PET/CT revealed Response to therapy, as evidenced by mildly decreased size and hypermetabolism within a dominant left submandibular mass. The previously described left-sided cervical nodal hypermetabolism has resolved. 2. Decrease in right inguinal nodal hypermetabolism, favored to be physiologic or reactive. 3. No new or progressive disease. 4. Incidental findings, including cholelithiasis, left adrenal adenoma.  PLAN: -labs reviewed with patient - stable -clinical evidence of progression of FL with increased left submandibular LNadenopathy -PET?CT to evaluate for other areas of FL progression -if only left submandibular LN progression will consult rad onc for consideration of ISRT to add left submandibular LNadenopathy.  FOLLOW UP: PET/CT in 1 week Phone visit with Dr [name] in 2 weeks   The total time spent in the appt was 20 minutes and more than 50% was on counseling and direct patient cares.  All of the patient's questions were answered with apparent satisfaction. The patient knows to call the clinic with any problems, questions or concerns.    Sullivan Lone MD [place] AAHIVMS [facility] Hematology/Oncology Physician [facility]   (Office):       [phone] (Work cell):  [phone] (Fax):           [phone]  07/04/2020 2:00 AM  I, Yevette Edwards, am acting as a scribe for Dr. Sullivan Lone.   .I have reviewed the above documentation for accuracy and completeness, and I agree with the above. Brunetta Genera MD

## 2020-07-04 NOTE — Telephone Encounter (Signed)
Scheduled appointments per 1/11 los. Spoke to patient who is aware of appointment date and time.  

## 2020-07-17 ENCOUNTER — Other Ambulatory Visit: Payer: Self-pay

## 2020-07-17 ENCOUNTER — Ambulatory Visit (HOSPITAL_COMMUNITY)
Admission: RE | Admit: 2020-07-17 | Discharge: 2020-07-17 | Disposition: A | Discharge status: Home / Self Care | Payer: Medicare HMO | Source: Ambulatory Visit | Attending: Hematology | Admitting: Hematology

## 2020-07-17 DIAGNOSIS — R221 Localized swelling, mass and lump, neck: Secondary | ICD-10-CM | POA: Diagnosis not present

## 2020-07-17 DIAGNOSIS — C8211 Follicular lymphoma grade II, lymph nodes of head, face, and neck: Secondary | ICD-10-CM | POA: Insufficient documentation

## 2020-07-17 DIAGNOSIS — K8020 Calculus of gallbladder without cholecystitis without obstruction: Secondary | ICD-10-CM | POA: Insufficient documentation

## 2020-07-17 DIAGNOSIS — K5730 Diverticulosis of large intestine without perforation or abscess without bleeding: Secondary | ICD-10-CM | POA: Diagnosis not present

## 2020-07-17 DIAGNOSIS — D3502 Benign neoplasm of left adrenal gland: Secondary | ICD-10-CM | POA: Insufficient documentation

## 2020-07-17 DIAGNOSIS — I700 Atherosclerosis of aorta: Secondary | ICD-10-CM | POA: Insufficient documentation

## 2020-07-17 LAB — GLUCOSE, CAPILLARY: Glucose-Capillary: 114 mg/dL — ABNORMAL HIGH (ref 70–99)

## 2020-07-17 MED ORDER — FLUDEOXYGLUCOSE F - 18 (FDG) INJECTION
13.9700 | Freq: Once | INTRAVENOUS | Status: AC | PRN
  Administered 2020-07-17: 13.97 via INTRAVENOUS

## 2020-07-17 NOTE — Progress Notes (Signed)
HEMATOLOGY/ONCOLOGY CLINIC NOTE  Date of Service: 07/17/2020  Patient Care Team: Gregor Hams, FNP as PCP - General (Family Medicine)  CHIEF COMPLAINTS/PURPOSE OF CONSULTATION:  F/u for FL  HISTORY OF PRESENTING ILLNESS:   Jeffery Wilson is a wonderful 70 y.o. male who has been referred to [place] by Dr. Helane Rima for evaluation and management of his concern for a Lymphoproliferative process. He is accompanied today by his daughter and wife. The pt reports that he is doing well overall.   The pt reports that he first noticed the left jaw mass two years ago which has slowly grown. 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 The pt notes that his PCP Dr. Lavone Neri noticed the mass in July/August at a wellness visit and he was subsequently referred to ENT Dr. Fenton Malling at [facility]. The pt denies pain, discomfort, fevers, chills, night sweats, unexpected weight loss, and any other lumps or bumps. He notes that he is not feeling any differently recently as compared to the last 6 months to a year.   The pt also notes that his right lower leg is also larger than his left, and has been this way all of his life. He notes that his ankle swelling is stable and has varicose veins.   The pt notes hammer toe surgery and arthroscopic left knee surgery. He notes that he was previously diagnosed with CKD but has managed this well and notes his creatinine has nearly normalized. He also notes that has hypertension.   The pt notes that he had Prevnar and Pneumovax last year and regularly receives his annual flu vaccine.   Of note prior to the patient's visit today, pt has had a Submandibular gland biopsy completed on 01/28/18 with results revealing concern for a lymphoproliferative process.   The pt also had a 01/15/18 CT Neck which revealed 3.3 x 4.1 x 5 cm solid LEFT  submandibular gland mass. Differential diagnosis includes benign mixed tumor or carcinoma. 2. Prominent though not pathologically enlarged LEFT lymph nodes potentially metastatic if tumor is malignant. 3. For above findings, recommend ENT consultation and histopathologic correlation.  Most recent lab results (01/28/18) of CBC is as follows: all values are WNL.  On review of systems, pt reports left jaw mass, stable energy levels, moving his bowels well, stable ankle swelling, and denies fevers, chills, night sweats, painful jaw mass, discomfort at jaw mass, unexpected weight loss, problems swallowing, pain along the spine, itching, back pain, new fatigue, abdominal pains, problems passing urine, and any other symptoms.   On PMHx the pt reports CKD, HTN, left knee arthroscopic knee surgery in 2016. 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 The pt smokes one cigarette every morning. He drinks 2-3 drinks of whiskey most afternoons, 4-6 on the weekends.  On Family Hx the pt reports father with MI at age 37. Denies blood disorders or cancer.   Interval History:   I connected with [name]  on 07/18/2020 by telephone and verified that I am speaking with the correct person using two identifiers.   I discussed the limitations of evaluation and management by telemedicine. The patient expressed understanding and agreed to proceed.   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 The patient's last visit with [place] was on 07/04/2020. The pt reports that he is doing well overall.  The pt reports no new concerns or symptoms.   Of note since the patient's last visit, pt has  had NM PET Image Restag (PS) Skull Base to Thigh ([id number]) completed on 07/17/2020, which revealed "1. The left submandibular mass is similar in size but has increased in activity compared to the prior exam, maximum SUV 9.2 (Deauville 5), previously 5.0. 2. The index right inguinal lymph node has a maximum SUV of 2.4 (Deauville 2). No additional significant sites of involvement identified. 3. Other imaging findings of potential clinical significance: Chronic right maxillary sinusitis. Cholelithiasis. Left adrenal adenoma. Aortic Atherosclerosis (ICD10-I70.0). Sigmoid colon Diverticulosis."  On review of systems, pt denies any new symptoms.   MEDICAL HISTORY:  Past Medical History:  Diagnosis Date  . Hypertension   . Renal disorder    pt says stage 3 kidney dz     SURGICAL HISTORY: Past Surgical History:  Procedure Laterality Date  . HAMMER TOE SURGERY    . KNEE SURGERY      SOCIAL HISTORY: Social History   Socioeconomic History  . Marital status: Married    Spouse name: Not on file  . Number of children: Not on file  . Years of education: Not on file  . Highest education level: Not on file  Occupational History  . Not on file  Tobacco Use  . Smoking status: Never Smoker  . Smokeless tobacco: Never Used  Vaping Use  . Vaping Use: Never used  Substance and Sexual Activity  . Alcohol use: Yes    Comment: daily   . Drug use: No  . Sexual activity: Not on file  Other Topics Concern  . Not on file  Social History Narrative  . Not on file   Social Determinants of Health   Financial Resource Strain: Not on file  Food Insecurity: Not on file  Transportation Needs: Not on file  Physical Activity: Not on file  Stress: Not on file  Social Connections: Not on file  Intimate Partner Violence: Not on file    FAMILY HISTORY: No family history on file.  ALLERGIES:  has No Known Allergies.  MEDICATIONS:  Current Outpatient Medications  Medication Sig Dispense Refill   . acetaminophen (TYLENOL) 500 MG tablet Take 1,000 mg by mouth every 6 (six) hours as needed for moderate pain or headache.    . Artificial Tear Solution (GENTEAL TEARS OP) Place 1 drop into both eyes at bedtime.    . chlorhexidine (PERIDEX) 0.12 % solution RINSE 15 ML IN MOUTH  TWICE DAILY AS DIRECTED 473 mL 0  . Coal Tar Extract [phone] PSORIASIS MEDICATED EX) Apply 1 application topically daily.    Marland Kitchen dexamethasone (DECADRON) 4 MG tablet Take 2 tablets (8 mg total) by mouth once as needed for up to 1 dose (8mg  once with lunch on day before each Rituxan dose). (Patient not taking: Reported on 08/13/2019) 10 tablet 0  . dicyclomine (BENTYL) 20 MG tablet Take 1 tablet (20 mg total) by mouth 3 (three) times daily before meals. As needed for abdominal cramping (Patient not taking: Reported on 05/25/2018) 20 tablet 0  . furosemide (LASIX) 40 MG tablet  Take 40 mg by mouth daily.     . metoprolol succinate (TOPROL-XL) 50 MG 24 hr tablet Take 50 mg by mouth daily. Take with or immediately following a meal.    . Milk Thistle 1000 MG CAPS Take 1 tablet by mouth.    . Multiple Vitamins-Minerals (MULTIVITAMIN PO) Take 1 tablet by mouth daily.    . Omega-3 Fatty Acids (OMEGA 3 PO) Take 1 capsule by mouth 2 (two) times daily.    . ondansetron (ZOFRAN ODT) 4 MG disintegrating tablet Take 1 tablet (4 mg total) by mouth every 8 (eight) hours as needed for nausea or vomiting. (Patient not taking: Reported on 01/20/2018) 20 tablet 0  . pravastatin (PRAVACHOL) 20 MG tablet Take 20 mg by mouth at bedtime.     . Turmeric 500 MG CAPS Take 500 mg by mouth daily.     No current facility-administered medications for this visit.    REVIEW OF SYSTEMS:   10 Point review of Systems was done is negative except as noted above.   PHYSICAL EXAMINATION: ECOG PERFORMANCE STATUS: 1 - Symptomatic but completely ambulatory  There were no vitals filed for this visit. There were no vitals filed for this visit. .There is no height  or weight on file to calculate BMI.   Telehealth Visit.  LABORATORY DATA:  I have reviewed the data as listed  . CBC Latest Ref Rng & Units 07/04/2020 03/06/2020 08/13/2019  WBC 4.0 - 10.5 K/uL 5.0 5.3 5.9  Hemoglobin 13.0 - 17.0 g/dL 15.7 15.9 14.8  Hematocrit 39.0 - 52.0 % 46.8 46.4 43.8  Platelets 150 - 400 K/uL 204 205 229    . CMP Latest Ref Rng & Units 07/04/2020 03/06/2020 08/13/2019  Glucose 70 - 99 mg/dL 116(H) 109(H) 111(H)  BUN 8 - 23 mg/dL 22 20 21   Creatinine 0.61 - 1.24 mg/dL 1.56(H) 1.43(H) 1.55(H)  Sodium 135 - 145 mmol/L 140 136 140  Potassium 3.5 - 5.1 mmol/L 4.4 3.9 4.2  Chloride 98 - 111 mmol/L 102 101 101  CO2 22 - 32 mmol/L 28 27 28   Calcium 8.9 - 10.3 mg/dL 9.9 9.8 9.6  Total Protein 6.5 - 8.1 g/dL 7.8 7.8 7.6  Total Bilirubin 0.3 - 1.2 mg/dL 0.8 1.0 0.7  Alkaline Phos 38 - 126 U/L 57 57 64  AST 15 - 41 U/L 21 23 19   ALT 0 - 44 U/L 23 21 17    Component     Latest Ref Rng & Units 03/24/2018  Hepatitis B Surface Ag     Negative Negative  Hep B Core Ab, Tot     Negative Negative  HIV Screen 4th Generation wRfx     Non Reactive Non Reactive  HCV Ab     0.0 - 0.9 s/co ratio <0.1  LDH     98 - 192 U/L 139   . Lab Results  Component Value Date   LDH 149 07/04/2020    01/28/18 Submandibular gland biopsy:    RADIOGRAPHIC STUDIES: I have personally reviewed the radiological images as listed and agreed with the findings in the report. NM PET Image Restag (PS) Skull Base To Thigh  Result Date: 07/17/2020 CLINICAL DATA:  Subsequent treatment strategy for follicular lymphoma. EXAM: NUCLEAR MEDICINE PET SKULL BASE TO THIGH TECHNIQUE: 14.0 mCi F-18 FDG was injected intravenously. Full-ring PET imaging was performed from the skull base to thigh after the radiotracer. CT data was obtained and used for attenuation correction and anatomic localization. Fasting blood glucose: 114  mg/dl COMPARISON:  PET-CT 08/04/2018 FINDINGS: Mediastinal blood pool activity: SUV max  3.0 Liver activity: SUV max 3.8 NECK: Left submandibular mass measures 3.6 by 4.4 cm (formerly 3.3 by 4.7 cm) and has a maximum SUV of 9.2 (Deauville 5), formerly 5.0. Incidental CT findings: Mild chronic left maxillary sinusitis. CHEST: No significant abnormal hypermetabolic activity in this region. Incidental CT findings: Old granulomatous disease. Subsegmental atelectasis along the right hemidiaphragm. ABDOMEN/PELVIS: Right inguinal lymph node 0.9 cm in short axis on image 207 of series 4 (stable) with maximum SUV 2.4 (Deauville 2), previously 1.9. No pathologically enlarged adenopathy is identified in the abdomen. Incidental CT findings: Cholelithiasis. Stable left adrenal adenoma. Mild aortoiliac atherosclerotic vascular disease. Sigmoid colon diverticulosis. SKELETON: No significant abnormal hypermetabolic activity in this region. Incidental inflammatory appearing activity posterior to the left glenohumeral joint likely related to arthropathy. Incidental CT findings: none IMPRESSION: 1. The left submandibular mass is similar in size but has increased in activity compared to the prior exam, maximum SUV 9.2 (Deauville 5), previously 5.0. 2. The index right inguinal lymph node has a maximum SUV of 2.4 (Deauville 2). No additional significant sites of involvement identified. 3. Other imaging findings of potential clinical significance: Chronic right maxillary sinusitis. Cholelithiasis. Left adrenal adenoma. Aortic Atherosclerosis (ICD10-I70.0). Sigmoid colon diverticulosis. Electronically Signed   By: Van Clines M.D.   On: 07/17/2020 16:12    ASSESSMENT & PLAN:   70 y.o. male with  1. 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    01/28/18 Mandibular gland biopsy revealed concern for a lymphoproliferative process, most concerning for a follicle cell lymphoma    03/24/18 Hep B, Hep C and HIV  labs were negative   04/02/18 PET/CT revealed Known left submandibular mass is hypermetabolic, consistent with the reported history B-cell lymphoma. Additional nonenlarged lymph nodes in the left neck shows low level FDG accumulation, suspicious for tumor involvement. 2. Tiny right groin lymph node shows low level FDG uptake, indeterminate. Otherwise, no evidence for hypermetabolic disease in the chest, abdomen, or pelvis. 3. Hepatic steatosis. 4. Cholelithiasis. 5. Colonic diverticulosis without diverticulitis.   04/24/18 Left cervical lymph node incisional biopsy revealed Follicular lymphoma, Grade 1-2 with a 10% proliferation rate   S/p 4 weekly cycles of Rituxan (limited rate) completed on 06/09/18  08/04/18 PET/CT revealed Response to therapy, as evidenced by mildly decreased size and hypermetabolism within a dominant left submandibular mass. The previously described left-sided cervical nodal hypermetabolism has resolved. 2. Decrease in right inguinal nodal hypermetabolism, favored to be physiologic or reactive. 3. No new or progressive disease. 4. Incidental findings, including cholelithiasis, left adrenal adenoma.  PLAN: -Discussed pt NM PET Image Restag (PS) Skull Base to Thigh ([id number]) completed on 07/17/2020; not much disease outside of left neck mass, size unchanged since last February, small lymph node unchanged in right groin -Advised pt that there is not much Lymphoma outside of left neck mass.  -Advised pt that local radiation is best option for treating the left neck mass.  -Discussed two options-- conservative approach of doing nothing versus local radiation of mass. The pt wishes to wait prior to radiation decision. The pt is not bothered by the left neck mass and does not wish to be bothered by the radiation treatment at this time. -Recommend pt f/u with Radiologist regarding decision on local radiation. The pt wishes to wait until the next follow  up with Dr. [name].  -Will see  back in 4 months with labs. The pt will be in contact prior if symptoms worsen or mass increases size.   FOLLOW UP: RTC with Dr [name] with labs in 4 months     The total time spent in the appointment was 20 minutes and more than 50% was on counseling and direct patient cares.    All of the patient's questions were answered with apparent satisfaction. The patient knows to call the clinic with any problems, questions or concerns.    Sullivan Lone MD [place] AAHIVMS [facility] Hematology/Oncology Physician [facility]   (Office):       [phone] (Work cell):  [phone] (Fax):           [phone]  07/17/2020 5:32 PM  I, Reinaldo Raddle, am acting as scribe for Dr. Sullivan Lone, MD.   .I have reviewed the above documentation for accuracy and completeness, and I agree with the above. Brunetta Genera MD

## 2020-07-18 ENCOUNTER — Inpatient Hospital Stay (HOSPITAL_BASED_OUTPATIENT_CLINIC_OR_DEPARTMENT_OTHER): Payer: Medicare HMO | Admitting: Hematology

## 2020-07-18 DIAGNOSIS — C8211 Follicular lymphoma grade II, lymph nodes of head, face, and neck: Secondary | ICD-10-CM | POA: Diagnosis not present

## 2020-09-29 IMAGING — CT NM PET TUM IMG INITIAL (PI) SKULL BASE T - THIGH
1 of 8 series · 1 of 25 positions shown · non-contrast
Comparison: CT neck 01/15/2018

CLINICAL DATA: Initial treatment strategy for B-cell lymphoma.

EXAM:
NUCLEAR MEDICINE PET SKULL BASE TO THIGH
TECHNIQUE: 13.7 mCi F-18 FDG was injected intravenously. Full-ring PET imaging
was performed from the skull base to thigh after the radiotracer. CT
data was obtained and used for attenuation correction and anatomic
localization.
Fasting blood glucose: 113 mg/dl

[Series 3: pet [id number] ac · axial · 5.0mm · 4.07mm/px · 1 of 262 slices shown]
[im 131/262]
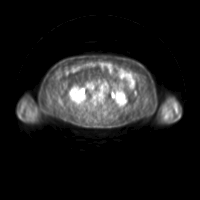

[1 of 25 positions shown; findings below may reference images not displayed]

FINDINGS: Mediastinal blood pool activity: SUV max

NECK: The patient's known left-sided submandibular mass is markedly
hypermetabolic with SUV max = 6.9. Small left level III cervical
lymph nodes show low level FDG uptake ( SUV max = 2.1) suspicious
for involvement by lymphoma.

Incidental CT findings: none

CHEST: No hypermetabolic mediastinal or hilar nodes. No suspicious
pulmonary nodules on the CT scan.

Incidental CT findings: none

ABDOMEN/PELVIS: No abnormal hypermetabolic activity within the
liver, pancreas, adrenal glands, or spleen. No hypermetabolic lymph
nodes in the abdomen or pelvis. Small lymph nodes are seen along the
pelvic sidewalls bilaterally but show no discernible
hypermetabolism. 8 mm short axis right groin node shows low level
uptake with SUV max = 2.3.

Incidental CT findings: Diffusely decreased attenuation of liver
parenchyma is compatible with fatty deposition. Calcified gallstones
noted. Diverticular changes noted left colon without diverticulitis.

SKELETON: No focal hypermetabolic activity to suggest skeletal
metastasis.

Incidental CT findings: none
IMPRESSION: 1. Known left submandibular mass is hypermetabolic, consistent with
the reported history B-cell lymphoma. Additional nonenlarged lymph
nodes in the left neck shows low level FDG accumulation, suspicious
for tumor involvement.
2. Tiny right groin lymph node shows low level FDG uptake,
indeterminate. Otherwise, no evidence for hypermetabolic disease in
the chest, abdomen, or pelvis.
3. Hepatic steatosis.
4. Cholelithiasis.
5. Colonic diverticulosis without diverticulitis.

## 2020-11-13 NOTE — Progress Notes (Signed)
HEMATOLOGY/ONCOLOGY CLINIC NOTE  Date of Service: 11/14/2020  Patient Care Team: Gregor Hams, FNP as PCP - General (Family Medicine)  CHIEF COMPLAINTS/PURPOSE OF CONSULTATION:  F/u for FL  HISTORY OF PRESENTING ILLNESS:   Jeffery Wilson is a wonderful 70 y.o. male who has been referred to [place] by Dr. Helane Rima for evaluation and management of his concern for a Lymphoproliferative process. He is accompanied today by his daughter and wife. The pt reports that he is doing well overall.   The pt reports that he first noticed the left jaw mass two years ago which has slowly grown. 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 The pt notes that his PCP Dr. Lavone Neri noticed the mass in July/August at a wellness visit and he was subsequently referred to ENT Dr. Fenton Malling at [facility]. The pt denies pain, discomfort, fevers, chills, night sweats, unexpected weight loss, and any other lumps or bumps. He notes that he is not feeling any differently recently as compared to the last 6 months to a year.   The pt also notes that his right lower leg is also larger than his left, and has been this way all of his life. He notes that his ankle swelling is stable and has varicose veins.   The pt notes hammer toe surgery and arthroscopic left knee surgery. He notes that he was previously diagnosed with CKD but has managed this well and notes his creatinine has nearly normalized. He also notes that has hypertension.   The pt notes that he had Prevnar and Pneumovax last year and regularly receives his annual flu vaccine.   Of note prior to the patient's visit today, pt has had a Submandibular gland biopsy completed on 01/28/18 with results revealing concern for a lymphoproliferative process.   The pt also had a 01/15/18 CT Neck which revealed 3.3 x 4.1 x 5 cm solid LEFT  submandibular gland mass. Differential diagnosis includes benign mixed tumor or carcinoma. 2. Prominent though not pathologically enlarged LEFT lymph nodes potentially metastatic if tumor is malignant. 3. For above findings, recommend ENT consultation and histopathologic correlation.  Most recent lab results (01/28/18) of CBC is as follows: all values are WNL.  On review of systems, pt reports left jaw mass, stable energy levels, moving his bowels well, stable ankle swelling, and denies fevers, chills, night sweats, painful jaw mass, discomfort at jaw mass, unexpected weight loss, problems swallowing, pain along the spine, itching, back pain, new fatigue, abdominal pains, problems passing urine, and any other symptoms.   On PMHx the pt reports CKD, HTN, left knee arthroscopic knee surgery in 2016. 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 The pt smokes one cigarette every morning. He drinks 2-3 drinks of whiskey most afternoons, 4-6 on the weekends.  On Family Hx the pt reports father with MI at age 75. Denies blood disorders or cancer.   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 The patient's last visit with [place] was on 07/17/2020. The pt reports that he is doing well overall. We are joined today by his wife.  The pt reports the mass has been increasing in size. He notes he has been dealing with seasonal allergies and mucus in his chest due  to this. The pt notes he called the emergency line one Saturday due to tooth pain and was prescribed Augmentin for his infection. H has seen his dentist and they already removed one tooth. There is one left that needs to be extracted. He goes to [facility].  Lab results today 11/14/2020 of CBC w/diff and CMP is as follows: all values are WNL except for Glucose of 105, Creatinine of 1.43, GFR est of 52. 11/14/2020 LDH of 146.  On review of systems, pt reports  recent dental infection, increase in mass lymph node and denies new lumps/bumps, fevers, chills, night sweats, decreased appetite, abdominal pain, back pain, and any other symptoms.  MEDICAL HISTORY:  Past Medical History:  Diagnosis Date  . Hypertension   . Renal disorder    pt says stage 3 kidney dz     SURGICAL HISTORY: Past Surgical History:  Procedure Laterality Date  . HAMMER TOE SURGERY    . KNEE SURGERY      SOCIAL HISTORY: Social History   Socioeconomic History  . Marital status: Married    Spouse name: Not on file  . Number of children: Not on file  . Years of education: Not on file  . Highest education level: Not on file  Occupational History  . Not on file  Tobacco Use  . Smoking status: Never Smoker  . Smokeless tobacco: Never Used  Vaping Use  . Vaping Use: Never used  Substance and Sexual Activity  . Alcohol use: Yes    Comment: daily   . Drug use: No  . Sexual activity: Not on file  Other Topics Concern  . Not on file  Social History Narrative  . Not on file   Social Determinants of Health   Financial Resource Strain: Not on file  Food Insecurity: Not on file  Transportation Needs: Not on file  Physical Activity: Not on file  Stress: Not on file  Social Connections: Not on file  Intimate Partner Violence: Not on file    FAMILY HISTORY: No family history on file.  ALLERGIES:  has No Known Allergies.  MEDICATIONS:  Current Outpatient Medications  Medication Sig Dispense Refill  . acetaminophen (TYLENOL) 500 MG tablet Take 1,000 mg by mouth every 6 (six) hours as needed for moderate pain or headache.    . Artificial Tear Solution (GENTEAL TEARS OP) Place 1 drop into both eyes at bedtime.    . chlorhexidine (PERIDEX) 0.12 % solution RINSE 15 ML IN MOUTH  TWICE DAILY AS DIRECTED 473 mL 0  . Coal Tar Extract [phone] PSORIASIS MEDICATED EX) Apply 1 application topically daily.    Marland Kitchen dexamethasone (DECADRON) 4 MG tablet Take 2 tablets (8 mg  total) by mouth once as needed for up to 1 dose (8mg  once with lunch on day before each Rituxan dose). (Patient not taking: Reported on 08/13/2019) 10 tablet 0  . dicyclomine (BENTYL) 20 MG tablet Take 1 tablet (20 mg total) by mouth 3 (three) times daily before meals. As needed for abdominal cramping (Patient not taking: Reported on 05/25/2018) 20 tablet 0  . furosemide (LASIX) 40 MG tablet Take 40 mg by mouth daily.     . metoprolol succinate (TOPROL-XL) 50 MG 24 hr tablet Take 50 mg by mouth daily. Take with or immediately following a meal.    . Milk Thistle 1000 MG CAPS Take 1 tablet by mouth.    . Multiple Vitamins-Minerals (MULTIVITAMIN PO) Take 1 tablet by mouth daily.    Marland Kitchen  Omega-3 Fatty Acids (OMEGA 3 PO) Take 1 capsule by mouth 2 (two) times daily.    . ondansetron (ZOFRAN ODT) 4 MG disintegrating tablet Take 1 tablet (4 mg total) by mouth every 8 (eight) hours as needed for nausea or vomiting. (Patient not taking: Reported on 01/20/2018) 20 tablet 0  . pravastatin (PRAVACHOL) 20 MG tablet Take 20 mg by mouth at bedtime.     . Turmeric 500 MG CAPS Take 500 mg by mouth daily.     No current facility-administered medications for this visit.    REVIEW OF SYSTEMS:   10 Point review of Systems was done is negative except as noted above.   PHYSICAL EXAMINATION: ECOG PERFORMANCE STATUS: 1 - Symptomatic but completely ambulatory  Vitals:   11/14/20 1415  BP: (!) 151/80  Pulse: (!) 58  Resp: 19  Temp: 97.6 F (36.4 C)  SpO2: 98%   Filed Weights   11/14/20 1415  Weight: 261 lb 3.2 oz (118.5 kg)   .Body mass index is 36.43 kg/m.    GENERAL:alert, in no acute distress and comfortable SKIN: no acute rashes, no significant lesions EYES: conjunctiva are pink and non-injected, sclera anicteric OROPHARYNX: MMM, no exudates, no oropharyngeal erythema or ulceration NECK: supple, no JVD LYMPH:  no palpable lymphadenopathy in the axillary or inguinal regions. Palpable in cervical  region. LUNGS: clear to auscultation b/l with normal respiratory effort HEART: regular rate & rhythm ABDOMEN:  normoactive bowel sounds , non tender, not distended. Extremity: no pedal edema PSYCH: alert & oriented x 3 with fluent speech NEURO: no focal motor/sensory deficits  LABORATORY DATA:  I have reviewed the data as listed  . CBC Latest Ref Rng & Units 11/14/2020 07/04/2020 03/06/2020  WBC 4.0 - 10.5 K/uL 6.7 5.0 5.3  Hemoglobin 13.0 - 17.0 g/dL 15.1 15.7 15.9  Hematocrit 39.0 - 52.0 % 45.2 46.8 46.4  Platelets 150 - 400 K/uL 212 204 205    . CMP Latest Ref Rng & Units 11/14/2020 07/04/2020 03/06/2020  Glucose 70 - 99 mg/dL 105(H) 116(H) 109(H)  BUN 8 - 23 mg/dL 19 22 20   Creatinine 0.61 - 1.24 mg/dL 1.43(H) 1.56(H) 1.43(H)  Sodium 135 - 145 mmol/L 141 140 136  Potassium 3.5 - 5.1 mmol/L 4.3 4.4 3.9  Chloride 98 - 111 mmol/L 102 102 101  CO2 22 - 32 mmol/L 29 28 27   Calcium 8.9 - 10.3 mg/dL 9.9 9.9 9.8  Total Protein 6.5 - 8.1 g/dL 7.4 7.8 7.8  Total Bilirubin 0.3 - 1.2 mg/dL 0.5 0.8 1.0  Alkaline Phos 38 - 126 U/L 62 57 57  AST 15 - 41 U/L 23 21 23   ALT 0 - 44 U/L 19 23 21    Component     Latest Ref Rng & Units 03/24/2018  Hepatitis B Surface Ag     Negative Negative  Hep B Core Ab, Tot     Negative Negative  HIV Screen 4th Generation wRfx     Non Reactive Non Reactive  HCV Ab     0.0 - 0.9 s/co ratio <0.1  LDH     98 - 192 U/L 139   . Lab Results  Component Value Date   LDH 146 11/14/2020    01/28/18 Submandibular gland biopsy:    RADIOGRAPHIC STUDIES: I have personally reviewed the radiological images as listed and agreed with the findings in the report. No results found.  ASSESSMENT & PLAN:   70 y.o. male with  1. 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 Additional nonenlarged lymph nodes in the left neck shows low level FDG accumulation, suspicious for tumor involvement. 2. Tiny right groin lymph node shows low level FDG uptake, indeterminate. Otherwise, no evidence for hypermetabolic disease in the chest, abdomen, or pelvis. 3. Hepatic steatosis. 4. Cholelithiasis. 5. Colonic diverticulosis without diverticulitis.   04/24/18 Left cervical lymph node incisional biopsy revealed Follicular lymphoma, Grade 1-2 with a 10% proliferation rate   S/p 4 weekly cycles of Rituxan (limited rate) completed on 06/09/18  08/04/18 PET/CT revealed Response to therapy, as evidenced by mildly decreased size and hypermetabolism within a dominant left submandibular mass. The previously described left-sided cervical nodal hypermetabolism has resolved. 2. Decrease in right inguinal nodal hypermetabolism, favored to be physiologic or reactive. 3. No new or progressive disease. 4. Incidental findings, including cholelithiasis, left adrenal adenoma.  PLAN: -Discussed pt labwork today, 11/14/2020; counts normal, chemistries stable. LDH normal. -Advised pt that we will connect him with [facility] and the dentist here due to increasing in size progressively. -Discussed local irritation and redness as well as soreness that can occur due to radiation. -Recommended use of steam inhaler for alleviating seasonal allergies. -Will send referrals to Dr. Lanell Persons and Dr. Gershon Mussel. -Will see back in 4 months with labs.   FOLLOW UP: -Referral to radiation oncology for consideration of ISRT for left neck follicular lymphoma . -Referral to [facility] Dr Gershon Mussel for dental pain in a patient with follicular lymphoma needing RT -Return to clinic with Dr. [name]  with labs in 4 months   The total time spent in the appointment was 20 minutes and more than 50% was on counseling and direct patient cares.    All of the patient's questions were answered with apparent satisfaction. The patient knows to call the clinic with any problems, questions or concerns.    Sullivan Lone MD [place] AAHIVMS [facility] Hematology/Oncology Physician [facility]   (Office):       [phone] (Work cell):  [phone] (Fax):           [phone]  11/14/2020 2:47 PM  I, Reinaldo Raddle, am acting as scribe for Dr. Sullivan Lone, MD.   .I have reviewed the above documentation for accuracy and completeness, and I agree with the above. Brunetta Genera MD

## 2020-11-14 ENCOUNTER — Inpatient Hospital Stay: Payer: Medicare HMO | Attending: Hematology

## 2020-11-14 ENCOUNTER — Other Ambulatory Visit: Payer: Self-pay | Admitting: *Deleted

## 2020-11-14 ENCOUNTER — Inpatient Hospital Stay (HOSPITAL_BASED_OUTPATIENT_CLINIC_OR_DEPARTMENT_OTHER): Payer: Medicare HMO | Admitting: Hematology

## 2020-11-14 ENCOUNTER — Other Ambulatory Visit: Payer: Self-pay

## 2020-11-14 VITALS — BP 151/80 | HR 58 | Temp 97.6°F | Resp 19 | Ht 71.0 in | Wt 261.2 lb

## 2020-11-14 DIAGNOSIS — C8221 Follicular lymphoma grade III, unspecified, lymph nodes of head, face, and neck: Secondary | ICD-10-CM | POA: Insufficient documentation

## 2020-11-14 DIAGNOSIS — C8211 Follicular lymphoma grade II, lymph nodes of head, face, and neck: Secondary | ICD-10-CM

## 2020-11-14 DIAGNOSIS — Z79899 Other long term (current) drug therapy: Secondary | ICD-10-CM | POA: Diagnosis not present

## 2020-11-14 DIAGNOSIS — I10 Essential (primary) hypertension: Secondary | ICD-10-CM | POA: Diagnosis not present

## 2020-11-14 LAB — CMP (CANCER CENTER ONLY)
ALT: 19 U/L (ref 0–44)
AST: 23 U/L (ref 15–41)
Albumin: 4.1 g/dL (ref 3.5–5.0)
Alkaline Phosphatase: 62 U/L (ref 38–126)
Anion gap: 10 (ref 5–15)
BUN: 19 mg/dL (ref 8–23)
CO2: 29 mmol/L (ref 22–32)
Calcium: 9.9 mg/dL (ref 8.9–10.3)
Chloride: 102 mmol/L (ref 98–111)
Creatinine: 1.43 mg/dL — ABNORMAL HIGH (ref 0.61–1.24)
GFR, Estimated: 52 mL/min — ABNORMAL LOW (ref >60)
Glucose, Bld: 105 mg/dL — ABNORMAL HIGH (ref 70–99)
Potassium: 4.3 mmol/L (ref 3.5–5.1)
Sodium: 141 mmol/L (ref 135–145)
Total Bilirubin: 0.5 mg/dL (ref 0.3–1.2)
Total Protein: 7.4 g/dL (ref 6.5–8.1)

## 2020-11-14 LAB — CBC WITH DIFFERENTIAL (CANCER CENTER ONLY)
Abs Immature Granulocytes: 0.03 10*3/uL (ref 0.00–0.07)
Basophils Absolute: 0 10*3/uL (ref 0.0–0.1)
Basophils Relative: 1 %
Eosinophils Absolute: 0.2 10*3/uL (ref 0.0–0.5)
Eosinophils Relative: 2 %
HCT: 45.2 % (ref 39.0–52.0)
Hemoglobin: 15.1 g/dL (ref 13.0–17.0)
Immature Granulocytes: 0 %
Lymphocytes Relative: 25 %
Lymphs Abs: 1.7 10*3/uL (ref 0.7–4.0)
MCH: 32.9 pg (ref 26.0–34.0)
MCHC: 33.4 g/dL (ref 30.0–36.0)
MCV: 98.5 fL (ref 80.0–100.0)
Monocytes Absolute: 0.7 10*3/uL (ref 0.1–1.0)
Monocytes Relative: 10 %
Neutro Abs: 4.1 10*3/uL (ref 1.7–7.7)
Neutrophils Relative %: 62 %
Platelet Count: 212 10*3/uL (ref 150–400)
RBC: 4.59 MIL/uL (ref 4.22–5.81)
RDW: 13.1 % (ref 11.5–15.5)
WBC Count: 6.7 10*3/uL (ref 4.0–10.5)
nRBC: 0 % (ref 0.0–0.2)

## 2020-11-14 LAB — LACTATE DEHYDROGENASE: LDH: 146 U/L (ref 98–192)

## 2020-11-21 NOTE — Progress Notes (Signed)
Oncology Nurse Navigator Documentation  Placed introductory call to new referral patient Roma Bierlein and his wife Alma Friendly  Introduced myself as the H&N oncology nurse navigator that works with Dr. Isidore Moos to whom he has been referred by Dr. [name]. He confirmed understanding of referral.  Briefly explained my role as his navigator, provided my contact information.   Confirmed understanding of upcoming appts and [place] location, explained arrival and registration process.  I explained the purpose of a dental evaluation prior to starting RT, indicated he would be contacted by [facility] to arrange an appt.    I encouraged them to call with questions/concerns as he moves forward with appts and procedures.    They verbalized understanding of information provided, expressed appreciation for my call.   Navigator Initial Assessment . Employment Status: he is retired . Currently on FMLA / STD: no . Living Situation: He lives with his wife.  . Support System: wife . PCP: Eleonore Chiquito MD . PCD: none . Financial Concerns:no . Transportation Needs: no . Sensory Deficits:no . Language Barriers/Interpreter Needed:  no . Ambulation Needs: no . DME Used in Home: no . Psychosocial Needs:  no . Concerns/Needs Understanding Cancer:  addressed/answered by navigator to best of ability . Self-Expressed Needs: no   Harlow Asa RN, BSN, OCN Head & Neck Oncology Nurse [place] at [facility] Phone # [phone]  Fax # [phone]

## 2020-11-21 NOTE — Progress Notes (Signed)
Head and Neck Cancer Location of Tumor / Histology: 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   Patient presented with symptoms of: (from Dr Grier Mitts intial consult note 03/24/2018): The pt reports that he first noticed the left jaw mass two years ago which has slowly grown. 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 The pt notes that his PCP Dr. Lavone Neri noticed the mass in July/August at a wellness visit and he was subsequently referred to ENT Dr. Fenton Malling at [facility]. The pt denies pain, discomfort, fevers, chills, night sweats, unexpected weight loss, and any other lumps or bumps  PET Scan 07/17/2020 IMPRESSION: 1. The left submandibular mass is similar in size but has increased in activity compared to the prior exam, maximum SUV 9.2 (Deauville 5), previously 5.0. 2. The index right inguinal lymph node has a maximum SUV of 2.4 (Deauville 2). No additional significant sites of involvement identified. 3. Other imaging findings of potential clinical significance: Chronic right maxillary sinusitis. Cholelithiasis. Left adrenal adenoma. Aortic Atherosclerosis (ICD10-I70.0). Sigmoid colon diverticulosis.  Biopsies revealed:  01/28/2018 Diagnosis Submandibular gland, biopsy, Left - ATYPICAL LYMPHOID PROLIFERATION. - SEE COMMENT. Microscopic Comment The sections show multiple needle core biopsy fragments of lymphoid tissue displaying a diffuse and vaguely nodular proliferation of small round to angulated lymphoid cells in addition to variable numbers of larger transformed lymphoid cells. In some areas, the vaguely nodular areas have the appearance of atypical lymphoid follicles. No fresh material is available for flow cytometric analysis and hence a large battery of immunohistochemical stains was performed. The lymphoid process is diffusely LCA positive and  cytokeratin negative. The majority of the lymphocytes consist of B-cells as seen with CD79a and CD20 which show staining in both diffuse and vaguely nodular areas. This is associated with BCL-6 expression. BCL-2 is diffusely positive, but no CD10 positivity is identified. CD21 highlights variably sized follicular dendritic networks. There is an admixed T-cell population to a lesser extent with staining mostly in the diffuse areas. CD30 highlights scattering of positive large lymphoid-appearing cells throughout. No significant staining is seen with Cyclin D1 or CD138. The latter highlights scattering of plasma cells. Ki-67 shows low expression in most areas (10% or less) although there is slightly more increased expression in the more nodular areas. The overall findings are limited but very concerning for a lymphoproliferative process, possibly of follicle center cell B-cell origin. Excisional biopsy is strongly recommended.  Nutrition Status Yes No Comments  Weight changes? '[]'  '[x]'    Swallowing concerns? '[]'  '[x]'    PEG? '[]'  '[x]'     Referrals Yes No Comments  Social Work? '[x]'  '[]'    Dentistry? '[x]'  '[]'    Swallowing therapy? '[x]'  '[]'    Nutrition? '[x]'  '[]'    Med/Onc? '[x]'  '[]'  Dr. Sullivan Lone   Safety Issues Yes No Comments  Prior radiation? '[]'  '[x]'    Pacemaker/ICD? '[]'  '[x]'    Possible current pregnancy? '[]'  '[x]'  N/A  Is the patient on methotrexate? '[]'  '[x]'     Tobacco/Marijuana/Snuff/ETOH use: Patient occasionally will smoke 0.5-1 cigarettes a day. Reports occasional alcohol consumption. Denies any recreational drug use  Past/Anticipated interventions by otolaryngology, if any: 04/24/2018 Dr. Fenton Malling Incisional biopsy of a submandibular node  Past/Anticipated interventions by medical oncology, if any:  Already established with Dr. Sullivan Lone 11/14/2020 --S/p 4 weekly cycles of Rituxan (limited rate) completed on 06/09/18 PLAN: -Discussed pt labwork today,  11/14/2020; counts normal, chemistries stable.  LDH normal. -Advised pt that we will connect him with [facility] and the dentist here due to increasing in size progressively. -Discussed local irritation and redness as well as soreness that can occur due to radiation. -Recommended use of steam inhaler for alleviating seasonal allergies. -Will send referrals to Dr. Lanell Persons and Dr. Gershon Mussel. -Will see back in 4 months with labs.  Current Complaints / other details:  Patient has received the first 3 Pfizer vaccines

## 2020-11-22 ENCOUNTER — Ambulatory Visit
Admission: RE | Admit: 2020-11-22 | Discharge: 2020-11-22 | Disposition: A | Discharge status: Home / Self Care | Payer: Medicare HMO | Source: Ambulatory Visit | Attending: Radiation Oncology | Admitting: Radiation Oncology

## 2020-11-22 ENCOUNTER — Ambulatory Visit: Payer: Medicare HMO

## 2020-11-22 ENCOUNTER — Institutional Professional Consult (permissible substitution): Payer: Medicare HMO | Admitting: Radiation Oncology

## 2020-11-22 ENCOUNTER — Other Ambulatory Visit: Payer: Self-pay

## 2020-11-22 ENCOUNTER — Encounter: Payer: Self-pay | Admitting: *Deleted

## 2020-11-22 VITALS — BP 157/89 | HR 59 | Temp 96.8°F | Resp 20 | Ht 71.0 in | Wt 256.5 lb

## 2020-11-22 DIAGNOSIS — I10 Essential (primary) hypertension: Secondary | ICD-10-CM | POA: Insufficient documentation

## 2020-11-22 DIAGNOSIS — Z79899 Other long term (current) drug therapy: Secondary | ICD-10-CM | POA: Insufficient documentation

## 2020-11-22 DIAGNOSIS — C8211 Follicular lymphoma grade II, lymph nodes of head, face, and neck: Secondary | ICD-10-CM | POA: Insufficient documentation

## 2020-11-22 NOTE — Progress Notes (Signed)
[place] Psychosocial Distress Screening Clinical Social Work  Clinical Social Work was referred by distress screening protocol.  The patient scored a 7 on the Psychosocial Distress Thermometer which indicates moderate distress. Clinical Social Worker contacted patient at home assess for distress and other psychosocial needs.  Patient stated he met with radiation oncology today and feel confident in his treatment team and is eager to begin treatment.  CSW and patient discussed the importance of support during treatment.  CSW provided education on CSW role and support programs at [facility].  Once patient receives his treatment schedule he plans to contact CSW to enroll in the [employer].  Patient was appreciative of CSW contact and plans to contact CSW with questions or concerns.          ONCBCN DISTRESS SCREENING 11/22/2020  Screening Type Initial Screening  Distress experienced in past week (1-10) 7  Family Problem type Other (comment)  Emotional problem type Adjusting to illness  Physician notified of physical symptoms Yes  Referral to clinical psychology No  Referral to clinical social work Yes  Referral to dietition Yes  Referral to financial advocate Yes  Referral to support programs Yes  Referral to palliative care No    [name], MSW, LCSW, OSW-C Clinical Social Worker [place] [phone]

## 2020-11-22 NOTE — Progress Notes (Signed)
Radiation Oncology         [phone] ________________________________  Initial Outpatient Consultation  Name: Jeffery Wilson MRN: [id number]  Date: 11/22/2020  DOB: [birth year]  CC:Jeffery Croak, MD  Jeffery Genera, MD   REFERRING PHYSICIAN: Brunetta Genera, MD  DIAGNOSIS:    ICD-10-CM   1. 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 male who presented with two-year history of left jaw mass back in July of 2019.  Biopsy of left submandibular gland on 01/28/2018 revealed: atypical lymphoid proliferation.  At [facility], bx of L neck node 04/24/18 showed: LEFT CERVICAL LYMPH NODE:  Follicular lymphoma, grade 1-2 of 3.  See path report for further details; favoring a low grade, and consistent w/ B-lymphocyte population.    Pertinent imaging thus far includes: 1. Soft tissue neck CT scan on 01/15/2018 that showed a 3.3 x 4.1 x 5 cm solid left submandibular gland mass. Differential diagnoses included benign mixed tumor vs carcinoma. There also prominent, though no pathologically enlarged, left lymph nodes that were potentially metastatic if tumor was malignant. 2. PET scan on 04/02/2018 revealed the known hypermetabolic left submandibular mass that was consistent with B-cell lymphoma. There were additional non-enlarged lymph nodes in the left neck that showed low-level FDG accumulation, suspicious for tumor involvement. Additionally, there were tiny right groin lymph nodes that showed low-level FDG uptake and were indeterminate. Otherwise, there was no evidence for hypermetabolic disease in the chest, abdomen, or pelvis.  Jeffery Wilson  prescribed four cycles of Rituxan that was started 05/19/18 and  completed on 06/09/2018.  3. PET scan on 08/04/2018 showed response to  therapy as evidenced by the mildly decreased size and hypermetabolism within the dominant left submandibular mass. The previously described left-sided cervical nodal hypermetabolism had resolved. Additionally, there was decrease in the right inguinal nodal hypermetabolism, favored to be physiologic or reactive. There was no new or progressive disease.  4. PET scan on 07/17/2020 showed a similar size of the left submandibular mass, but with increased activity when compared to prior exam. The index right inguinal lymph node had a maximum SUV of 2.4. There were no additional significant sites of involvement identified.  The pt denied pain, discomfort, fevers, chills, night sweats, unexpected weight loss, and any other lumps or bumps at diagnosis  Tobacco/Marijuana/Snuff/ETOH use: Patient occasionally will smoke 0.5-1 cigarettes a day. Reports occasional alcohol consumption. Denies any recreational drug use     PREVIOUS RADIATION THERAPY: No  PAST MEDICAL HISTORY:  has a past medical history of Hypertension and Renal disorder.    PAST SURGICAL HISTORY: Past Surgical History:  Procedure Laterality Date  . HAMMER TOE SURGERY    . KNEE SURGERY      FAMILY HISTORY: family history is not on file.  SOCIAL HISTORY:  reports that he has never smoked. He has never used smokeless tobacco. He reports current alcohol use. He reports that he does not use drugs.  ALLERGIES: Patient has no known allergies.  MEDICATIONS:  Current Outpatient Medications  Medication Sig Dispense Refill  . loratadine (CLARITIN) 10 MG tablet Take 1 tablet by mouth daily.    Marland Kitchen acetaminophen (TYLENOL) 500 MG tablet Take 1,000 mg by mouth every 6 (six) hours as needed for moderate pain or headache.    . Artificial  Tear Solution (GENTEAL TEARS OP) Place 1 drop into both eyes at bedtime.    Beryl Meager Tar Extract [phone] PSORIASIS MEDICATED EX) Apply 1 application topically daily.    . furosemide (LASIX) 40 MG tablet Take 40 mg by  mouth daily.     . metoprolol succinate (TOPROL-XL) 50 MG 24 hr tablet Take 50 mg by mouth daily. Take with or immediately following a meal.    . Milk Thistle 1000 MG CAPS Take 1 tablet by mouth.    . Multiple Vitamins-Minerals (MULTIVITAMIN PO) Take 1 tablet by mouth daily.    . Omega-3 Fatty Acids (OMEGA 3 PO) Take 1 capsule by mouth 2 (two) times daily.    . pravastatin (PRAVACHOL) 20 MG tablet Take 20 mg by mouth at bedtime.     . Turmeric 500 MG CAPS Take 500 mg by mouth daily.     No current facility-administered medications for this encounter.    REVIEW OF SYSTEMS:  Notable for that above.   PHYSICAL EXAM:  height is 5\' 11"  (1.803 m) and weight is 256 lb 8 oz (116.3 kg). His temporal temperature is 96.8 F (36 C) (abnormal). His blood pressure is 157/89 (abnormal) and his pulse is 59 (abnormal). His respiration is 20 and oxygen saturation is 100%.   General: Alert and oriented, in no acute distress HEENT: Head is normocephalic. Extraocular movements are intact. Oropharynx is notable for no lesions. Neck: Neck is notable for bulky left level 2-3 mass, no skin ulceration Heart: Regular in rate and rhythm with no murmurs, rubs, or gallops. Chest: Clear to auscultation bilaterally, with no rhonchi, wheezes, or rales. Psychiatric: Judgment and insight are intact. Affect is appropriate. Ext: lower ext edema, R>L (chronic condition per pt)  ECOG = 1  0 - Asymptomatic (Fully active, able to carry on all predisease activities without restriction)  1 - Symptomatic but completely ambulatory (Restricted in physically strenuous activity but ambulatory and able to carry out work of a light or sedentary nature. For example, light housework, office work)  2 - Symptomatic, <50% in bed during the day (Ambulatory and capable of all self care but unable to carry out any work activities. Up and about more than 50% of waking hours)  3 - Symptomatic, >50% in bed, but not bedbound (Capable of only  limited self-care, confined to bed or chair 50% or more of waking hours)  4 - Bedbound (Completely disabled. Cannot carry on any self-care. Totally confined to bed or chair)  5 - Death   Eustace Pen MM, Creech RH, Tormey DC, et al. [phone]). "Toxicity and response criteria of the [facility]". [place] Oncol. 5 (6): 649-55   LABORATORY DATA:  Lab Results  Component Value Date   WBC 6.7 11/14/2020   HGB 15.1 11/14/2020   HCT 45.2 11/14/2020   MCV 98.5 11/14/2020   PLT 212 11/14/2020   CMP     Component Value Date/Time   NA 141 11/14/2020 1346   K 4.3 11/14/2020 1346   CL 102 11/14/2020 1346   CO2 29 11/14/2020 1346   GLUCOSE 105 (H) 11/14/2020 1346   BUN 19 11/14/2020 1346   CREATININE 1.43 (H) 11/14/2020 1346   CALCIUM 9.9 11/14/2020 1346   PROT 7.4 11/14/2020 1346   ALBUMIN 4.1 11/14/2020 1346   AST 23 11/14/2020 1346   ALT 19 11/14/2020 1346   ALKPHOS 62 11/14/2020 1346   BILITOT 0.5 11/14/2020 1346   GFRNONAA 52 (L) 11/14/2020 1346  GFRAA 57 (L) 03/06/2020 0919      No results found for: TSH   RADIOGRAPHY: as above, I personally reviewed his images    IMPRESSION/PLAN:  This is a delightful patient with head and neck cancer - low grade follicular lymphoma. I  recommend 3 and 1/2 wks of  radiotherapy for this patient; medical oncology does not recommend systemic therapy at this juncture.  We discussed the potential risks, benefits, and side effects of radiotherapy. We talked in detail about acute and late effects. We discussed that some of the most bothersome acute effects may be mucositis, dysgeusia, salivary changes, skin irritation, hair loss,  and fatigue. We talked about late effects which include potential injury to any of the tissues in the head and neck region. No guarantees of treatment were given. A consent form was signed and placed in the patient's medical record. The patient is enthusiastic about proceeding with treatment. I look forward  to participating in the patient's care.    Simulation (treatment planning) will take place early next week.  We also discussed that the treatment of head and neck cancer is a multidisciplinary process to maximize treatment outcomes and quality of life. For this reason the following referrals have been or will be made:   Dentistry for dental evaluation - though dose to tooth roots will be <40Gy; therefore, patient and I agree to proceed w/ RT asap as he wants to shrink his neck mass without delay.  And if extractions are recommended, they will follow treatment.  Social work for social support.   On date of service, in total, I spent 50 minutes on this encounter. Patient was seen in person.  __________________________________________   [name], MD  This document serves as a record of services personally performed by [name], MD. It was created on his behalf by Clerance Lav, a trained medical scribe. The creation of this record is based on the scribe's personal observations and the provider's statements to them. This document has been checked and approved by the attending provider.

## 2020-11-22 NOTE — Progress Notes (Signed)
Oncology Nurse Navigator Documentation  Met with patient during initial consult with Dr. Isidore Moos.  He was accompanied by his wife Alma Friendly. . Further introduced myself as his/their Navigator, explained my role as a member of the Care Team. . Provided New Patient Information packet: o Contact information for physician, this navigator, other members of the Care Team o Advance Directive information ([place] blue pamphlet with LCSW insert); provided [facility] AD booklet at his request,  o Fall Prevention Patient [place] sheet o Symptom Management Clinic information o [facility] campus map with highlight of [place] . Assisted with post-consult appt scheduling. . They verbalized understanding of information provided. . I encouraged them to call with questions/concerns moving forward.  Harlow Asa, RN, BSN, OCN Head & Neck Oncology Nurse [facility] [place] [phone]

## 2020-11-25 ENCOUNTER — Encounter: Payer: Self-pay | Admitting: Radiation Oncology

## 2020-11-27 ENCOUNTER — Encounter (HOSPITAL_COMMUNITY): Payer: Self-pay | Admitting: Dentistry

## 2020-11-27 ENCOUNTER — Ambulatory Visit (INDEPENDENT_AMBULATORY_CARE_PROVIDER_SITE_OTHER): Payer: Medicare HMO | Admitting: Dentistry

## 2020-11-27 ENCOUNTER — Ambulatory Visit
Admission: RE | Admit: 2020-11-27 | Discharge: 2020-11-27 | Disposition: A | Discharge status: Home / Self Care | Payer: Medicare HMO | Source: Ambulatory Visit | Attending: Radiation Oncology | Admitting: Radiation Oncology

## 2020-11-27 ENCOUNTER — Encounter: Payer: Self-pay | Admitting: *Deleted

## 2020-11-27 ENCOUNTER — Other Ambulatory Visit: Payer: Self-pay

## 2020-11-27 DIAGNOSIS — Z01818 Encounter for other preprocedural examination: Secondary | ICD-10-CM | POA: Diagnosis not present

## 2020-11-27 DIAGNOSIS — C8211 Follicular lymphoma grade II, lymph nodes of head, face, and neck: Secondary | ICD-10-CM

## 2020-11-27 DIAGNOSIS — K030 Excessive attrition of teeth: Secondary | ICD-10-CM

## 2020-11-27 DIAGNOSIS — K032 Erosion of teeth: Secondary | ICD-10-CM

## 2020-11-27 DIAGNOSIS — K0889 Other specified disorders of teeth and supporting structures: Secondary | ICD-10-CM

## 2020-11-27 DIAGNOSIS — K036 Deposits [accretions] on teeth: Secondary | ICD-10-CM

## 2020-11-27 DIAGNOSIS — K06020 Generalized gingival recession, unspecified: Secondary | ICD-10-CM

## 2020-11-27 DIAGNOSIS — K0530 Chronic periodontitis, unspecified: Secondary | ICD-10-CM

## 2020-11-27 DIAGNOSIS — Z7189 Other specified counseling: Secondary | ICD-10-CM | POA: Diagnosis not present

## 2020-11-27 DIAGNOSIS — Z510 Encounter for antineoplastic radiation therapy: Secondary | ICD-10-CM | POA: Insufficient documentation

## 2020-11-27 DIAGNOSIS — K08109 Complete loss of teeth, unspecified cause, unspecified class: Secondary | ICD-10-CM

## 2020-11-27 NOTE — Progress Notes (Signed)
[place] Work  Clinical Social Work met with patient and patients wife is CSW office to enroll in the [employer].  Patient enrolled and first distribution given.  [name], MSW, LCSW, OSW-C Clinical Social Worker [facility] [phone]

## 2020-11-27 NOTE — Progress Notes (Signed)
Department of Dental Medicine     OUTPATIENT CONSULTATION  Service Date:   11/27/2020  Patient Name:   Jeffery Wilson Date of Birth:   April 05, 1951 Medical Record Number: [id number]  Referring Provider:               [name], MD  TODAY'S VISIT   Assessment:   There are no current signs of acute odontogenic infection including abscess, edema or erythema, or suspicious lesion requiring biopsy.    Recommendations:   No dental intervention indicated prior to radiation at this time.  Establish dental care at an outside office of the patient's choice for routine care including cleanings/periodontal therapy and periodic exams. Plan:   Discuss case with medical team and coordinate treatment as needed. Follow-up after completion of radiation therapy.  Discussed in detail all treatment options and recommendations with the patient and they are agreeable to the plan.    Thank you for consulting with Hospital Dentistry and for the opportunity to participate in this patient's treatment.  Should you have any questions or concerns, please contact the [facility] at [phone].   PROGRESS NOTE:   COVID-19 SCREENING:  The patient denies symptoms concerning for COVID-19 infection including fever, chills, cough, or newly developed shortness of breath.   HISTORY OF PRESENT ILLNESS: Jeffery Wilson is a very pleasant 70 y.o. male with h/o hypertension, renal disease and cancer (lymphoma) who was recently diagnosed with follicular lymphoma of lymph nodes of neck and is anticipating head and neck radiation.  The patient presents today for a medically necessary dental consultation as part of their pre-radiation work-up.   DENTAL HISTORY: The patient reports not having a primary dentist that he sees regularly.  He did go to [facility] not too long ago for an emergency extraction but does not plan on returning there for routine care.  He currently denies any dental/orofacial pain or  sensitivity, but he does report some pain/sensitivity related to tooth #20. Patient is able to manage oral secretions.  Patient denies dysphagia, odynophagia, dysphonia, SOB and neck pain.  Patient denies fever, rigors and malaise.   CHIEF COMPLAINT:  Here for a pre-head and neck radiation dental exam.   Patient Active Problem List   Diagnosis Date Noted  . 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 Counseling regarding advance care planning and goals of care 05/12/2018   Past Medical History:  Diagnosis Date  . Hypertension   . Renal disorder    pt says stage 3 kidney dz    Past Surgical History:  Procedure Laterality Date  . HAMMER TOE SURGERY    . KNEE SURGERY     No Known Allergies Current Outpatient Medications  Medication Sig Dispense Refill  . acetaminophen (TYLENOL) 500 MG tablet Take 1,000 mg by mouth every 6 (six) hours as needed for moderate pain or headache.    . Artificial Tear Solution (GENTEAL TEARS OP) Place 1 drop into both eyes at bedtime.    Beryl Meager Tar Extract [phone] PSORIASIS MEDICATED EX) Apply 1 application topically daily.    . furosemide (LASIX) 40 MG tablet Take 40 mg by mouth daily.     Marland Kitchen loratadine (CLARITIN) 10 MG tablet Take 1 tablet by mouth daily.    . metoprolol succinate (TOPROL-XL) 50 MG 24 hr tablet Take 50 mg by mouth daily. Take with or immediately following a meal.    . Milk Thistle 1000 MG CAPS Take 1  tablet by mouth.    . Multiple Vitamins-Minerals (MULTIVITAMIN PO) Take 1 tablet by mouth daily.    . Omega-3 Fatty Acids (OMEGA 3 PO) Take 1 capsule by mouth 2 (two) times daily.    . pravastatin (PRAVACHOL) 20 MG tablet Take 20 mg by mouth at bedtime.     . Turmeric 500 MG CAPS Take 500 mg by mouth daily.     No current facility-administered medications for this visit.    LABS: Lab Results  Component Value Date   WBC 6.7 11/14/2020   HGB 15.1 11/14/2020   HCT 45.2 11/14/2020   MCV 98.5 11/14/2020   PLT  212 11/14/2020      Component Value Date/Time   NA 141 11/14/2020 1346   K 4.3 11/14/2020 1346   CL 102 11/14/2020 1346   CO2 29 11/14/2020 1346   GLUCOSE 105 (H) 11/14/2020 1346   BUN 19 11/14/2020 1346   CREATININE 1.43 (H) 11/14/2020 1346   CALCIUM 9.9 11/14/2020 1346   GFRNONAA 52 (L) 11/14/2020 1346   GFRAA 57 (L) 03/06/2020 0919   Lab Results  Component Value Date   INR 1.05 01/28/2018   No results found for: PTT  Social History   Socioeconomic History  . Marital status: Married    Spouse name: Not on file  . Number of children: Not on file  . Years of education: Not on file  . Highest education level: Not on file  Occupational History  . Not on file  Tobacco Use  . Smoking status: Never Smoker  . Smokeless tobacco: Never Used  Vaping Use  . Vaping Use: Never used  Substance and Sexual Activity  . Alcohol use: Yes    Comment: daily   . Drug use: No  . Sexual activity: Not on file  Other Topics Concern  . Not on file  Social History Narrative  . Not on file   Social Determinants of Health   Financial Resource Strain: Not on file  Food Insecurity: Not on file  Transportation Needs: Not on file  Physical Activity: Not on file  Stress: Not on file  Social Connections: Not on file  Intimate Partner Violence: Not on file   No family history on file.   REVIEW OF SYSTEMS:  Reviewed with the patient as per HPI. Psych: Patient denies having dental phobia.   VITAL SIGNS: BP 140/86 (BP Location: Right Arm)   Pulse (!) 55   Temp 98.8 F (37.1 C) (Oral)    PHYSICAL EXAM: General:  Well-developed, comfortable and in no apparent distress. Neurological:  Alert and oriented to person, place and  time. Extraoral:  Facial symmetry present without any edema or erythema.  TMJ asymptomatic without clicks or crepitations. (+) Left neck notable for large mass. Maximum Interincisal Opening:  60 mm Intraoral:  Soft tissues appear well-perfused and mucous  membranes moist.  FOM and vestibules soft and not raised. Oral cavity without mass or lesion. No signs of infection, parulis, sinus tract, edema or erythema evident upon exam.    DENTAL EXAM Hard tissue exam completed and charted. Overall impression:  Good remaining dentition.  Missing teeth, existing restorations. Oral hygiene:  Fair    Periodontal:  Pink, healthy gingival tissue with blunted papilla.  Localized calculus accumulation.  Gingival recession. Tooth #14 has class I mobility. Removable/fixed prosthodontics:  Patient denies wearing partial dentures. Occlusion:  Unable to assess molar occlusion.  Non-functional teeth numbers 1, 14 and 20.  Supra-erupted teeth numbers 1 and  14. Other findings:  Attrition/wear: #6-#11 incisal, #22-#27 incisal.  Abfraction(s): #4B(V), #5B(V), #21B(V) and #28B(V)   RADIOGRAPHIC EXAM:  PAN and Full Mouth Series exposed and interpreted.  Condyles seated bilaterally in fossas.  No evidence of abnormal pathology.  All visualized osseous structures appear WNL.  Right side radiopaque region and same on left only blurred below the angle of the mandible indicative of lymph node calcifications. #1 and #14 are supra-erupted. #18 and #31 are mesially inclined.  Generalized mild to moderate horizontal bone loss consistent with mild to moderate periodontitis.  Radiographic calculus accumulation evident.  Missing teeth and existing restorations. Evidence of wear to dentin on teeth #7-#10 incisal edges.   ASSESSMENT:  1.  Follicular lymphoma of lymph nodes of neck 2.  Preoperative dental exam 3.  Missing teeth 4.  Accretions on teeth 5.  Chronic periodontitis 6.  Gingival recession, generalized 7.  Loose teeth 8.  Attrition/wear 9.  Abfraction/flexure   PROCEDURES: The common and significant side effects of radiation therapy to the head and neck were explained and discussed with the patient.  The discussion included side effects of trismus (limited  opening), dysgeusia (loss of taste), xerostomia (dry mouth), radiation caries and osteoradionecrosis of the jaw.  I also discussed the importance of maintaining optimal oral hygiene and oral health before, during and after radiation to decrease the risk of developing radiation cavities and the need for any surgery such as extractions after therapy.    Trismus appliance made using patient's baseline MIO (30 sticks).  Leta Speller, DAII demonstrated use of appliance.  Verbal and written postop instructions were given to the patient.   PLAN AND RECOMMENDATIONS: I discussed the risks, benefits, and complications of various scenarios with the patient in relationship to their medical and dental conditions, which included systemic infection or other serious issues like osteoradionecrosis that could potentially occur either before, during or after their anticipated radiation therapy if dental/oral concerns are not addressed.  I explained that if any chronic or acute dental/oral infection(s) are addressed and subsequently not maintained following medical optimization and recovery, their risk of the previously mentioned complications are just as high and could potentially occur postoperatively.  I explained all significant findings of the dental consultation with the patient, and the recommended care including finding a regular dentist for routine care including cleanings/periodontal therapy and periodic exams in order to optimize them following radiation from a dental standpoint.  The patient verbalized understanding of all findings, discussion, and recommendations. We then discussed various treatment options to include no treatment, multiple extractions with alveoloplasty, pre-prosthetic surgery as indicated, periodontal therapy, dental restorations, root canal therapy, crown and bridge therapy, implant therapy, and replacement of missing teeth as indicated.  The patient verbalized understanding of all options, and  currently wishes to proceed with establishing routine care at a dental office following radiation therapy. Plan to discuss all findings and recommendations with medical team and coordinate future care as needed.  Follow-up with the patient after radiation in the hospital dental clinic.  All questions and concerns were invited and addressed.  The patient tolerated today's visit well and departed in stable condition.   I spent in excess of 120 minutes during the conduct of this consultation and >50% of this time involved direct face-to-face encounter for counseling and/or coordination of the patient's care. Farson Benson [place], D.M.D.

## 2020-11-27 NOTE — Patient Instructions (Signed)
Jeffery Wilson, D.M.D. Phone: [phone] Fax: [phone]   It was a pleasure seeing you today!  Please refer to the information below regarding your dental visit with [place].  Call if you have any questions or concerns that come up after you leave.   Thank you for letting us provide care for you.  If there is anything we can do for you, please let us know.    RADIATION THERAPY AND INFORMATION REGARDING YOUR TEETH   . XEROSTOMIA (DRY MOUTH):  Your salivary glands may be in the field of radiation.  Radiation may include all or only part of your salivary glands.  This will cause your saliva to dry up, and you will have a dry mouth.  The dry mouth will be for the rest of your life unless your radiation oncologist tells you otherwise.  Your saliva has many functions: 1. It wets your tongue for speaking. 2. It coats your teeth and the inside of your mouth for easier movement. 3. It helps with chewing and swallowing food. 4. It helps clean away harmful acid and toxic products made by the germs in your mouth, therefore it helps prevent cavities. 5. It kills some germs in your mouth and helps to prevent gum disease. 6. It helps to carry flavor to your taste buds.  Once you have lost your saliva, you will be at higher risk for tooth decay and gum disease.    What can be done to help improve your mouth when there's not enough saliva? Marland Kitchen Your dentist may give a recommendation for CLoSYS.  It will not bring back all of your saliva but may bring back some of it.  Also, your saliva may be thick and ropy or white and foamy.  It will not feel like it use to feel. . You will need to swish with water every time your mouth feels dry.  YOU CANNOT suck on any cough drops, mints, lemon drops, candy, vitamin C or any other products.  You cannot use anything other than water to make your mouth feel less dry.  If you want to drink anything else, you have to drink it  all at once and brush afterwards.  Be sure to discuss the details of your diet habits with your dentist or hygienist.   . RADIATION CARIES:  This is decay (cavities) that happens very quickly once your mouth is very dry due to radiation therapy.  Normally, cavities take six months to two years to become a problem.  When you have dry mouth, cavities may take as little as eight weeks to cause you a problem.    o Dental check-ups every two months are necessary as long as you have a dry mouth. Radiation caries typically, but not always, start at your gum line where it is hard to see the cavity.  It is therefore also hard to fill these cavities adequately.  This high rate of cavities happens because your mouth no longer has saliva and therefore the acid made by the germs starts the decay process.  Whenever you eat anything the germs in your mouth change the food into acid.  The acid then burns a small hole in your tooth.  This small hole is the beginning of a cavity.  If this is not treated then it will grow bigger and become a cavity.  The way to avoid this hole getting bigger is to use fluoride every evening as prescribed by your  dentist following your radiation. o NOTE:  You have to make sure that your teeth are very clean before you use the fluoride.  This fluoride in turn will strengthen your teeth and prepare them for another day of fighting acid. o If you develop radiation caries many times, the damage is so large that you will have to have all your teeth removed.  This could be a big problem if some of these teeth are in the field of radiation.  Further details of why this could be a big problem will follow (see Osteoradionecrosis below).   . DYSGEUSIA (LOSS OF TASTE):  This happens to varying degrees once you've had radiation therapy to your jaw region.  Many times taste is not completely lost, but becomes limited.  The loss of taste is mostly due to radiation affecting your taste buds.  However, if  you have no saliva in your mouth to carry the flavor to your taste buds, it would be difficult for your taste buds to taste anything.  That is why using water or a prescription for Salagen prior to meals and during meal times may help with some of the taste.  Keep in mind that taste generally returns very slowly over the course of several months or several years after radiation therapy.  Don't give up hope.   . TRISMUS (LIMITED JAW OPENING):  According to your radiation oncologist, your TMJ or jaw joints are going to be partially or fully in the field of radiation.  This means that over time the muscles that help you open and close your mouth may get stiff.  This will potentially result in your not being able to open your mouth wide enough or as wide as you can open it now.    Let me give you an example of how slowly this happens and how unaware people are of it:   . A gentlemen that had radiation therapy two years ago came back to me complaining that bananas are just too large for him to be able to fit them in between his teeth.  He was not able to open wide enough to bite into a banana.  This happens slowly and over a period of time.  What we do to try and prevent this:   1. Your dentist will probably give you a stack of sticks called a trismus exercise device.  This stack will help remind your muscles and your jaw joints to open up to the same distance every day.  Use these sticks every morning when you wake up, or according to the instructions given by your dentist.    2. You must use these sticks for at least one to two years after radiation therapy.  The reason for that is because it happens so slowly and keeps going on for about two years after radiation therapy.  Your hospital dentist will help you monitor your mouth opening and make sure that it's not getting smaller after radiation.  TRISMUS EXERCISES: 1. Using the stack of sticks given to you by your dentist, place the stack in your mouth and  hold onto the other end for support. 2. Leave the sticks in your mouth while holding the other end.  Allow 30 seconds for muscle stretching. 3. Rest for a few seconds. 4. Repeat 3-5 times. 5. This exercise is recommended in the mornings and evenings unless otherwise instructed. 6. The exercise should be done for a period of 2 YEARS after the end of radiation. Marland Kitchen  Your maximum jaw opening should be checked regularly at recall dental visits by your general dentist. . You should report any changes, soreness, or difficulties encountered when doing the exercises to your dentist.   . OSTEORADIONECROSIS (ORN):  This is a condition where your jaw bone after radiation therapy becomes very dry.  It has very little blood supply to keep it alive.  If you develop a cavity that turns into an abscess or an infection, then the jaw bone does not have enough blood supply to help fight the infection.  At this point it is very likely that the infection could cause the death of your jaw bone.  When you have dead bone it has to be removed.  Therefore, you might end up having to have surgery to remove part of your jaw bone, the part of the jaw bone that has been affected.     o Healing is also a problem if you are to have surgery (like a tooth extraction) in the areas where the bone has had radiation therapy.  If you have surgery, you need more blood supply to heal which is not available.  When blood supply and oxygen are not available, there is a chance for the bone to die. o Occasionally, ORN happens on its own with no obvious reason, but this is quite rare.  We believe that patients who continue to smoke and/or drink alcohol have a higher chance of having this problem. o Once your jaw bone has had radiation therapy, if there are any remaining teeth in that area, it is not recommended to have them pulled unless your dentist or oral surgeon is aware of your history of radiation and believes it is safe.  o The risks for ORN  either from infection or spontaneously occurring (with no reason) are life long.   QUESTIONS?  Call our office during office hours at [phone].

## 2020-12-05 DIAGNOSIS — Z510 Encounter for antineoplastic radiation therapy: Secondary | ICD-10-CM | POA: Diagnosis not present

## 2020-12-06 ENCOUNTER — Ambulatory Visit
Admission: RE | Admit: 2020-12-06 | Discharge: 2020-12-06 | Disposition: A | Discharge status: Home / Self Care | Payer: Medicare HMO | Source: Ambulatory Visit | Attending: Radiation Oncology | Admitting: Radiation Oncology

## 2020-12-06 ENCOUNTER — Other Ambulatory Visit: Payer: Self-pay

## 2020-12-06 DIAGNOSIS — Z510 Encounter for antineoplastic radiation therapy: Secondary | ICD-10-CM | POA: Diagnosis not present

## 2020-12-06 NOTE — Progress Notes (Signed)
Oncology Nurse Navigator Documentation  To provide support, encouragement and care continuity, met with Jeffery Wilson for his initial RT.  He was accompanied by his daughter. I reviewed the 2-step treatment process, answered questions.  Jeffery Wilson completed treatment without difficulty, denied questions/concerns. I reviewed the registration/arrival procedure for subsequent treatments. I encouraged them to call me with questions/concerns as tmts proceed.   Harlow Asa RN, BSN, OCN Head & Neck Oncology Nurse [place] at [facility] [address] Phone # [phone]  Fax # [phone]

## 2020-12-07 ENCOUNTER — Ambulatory Visit
Admission: RE | Admit: 2020-12-07 | Discharge: 2020-12-07 | Disposition: A | Discharge status: Home / Self Care | Payer: Medicare HMO | Source: Ambulatory Visit | Attending: Radiation Oncology | Admitting: Radiation Oncology

## 2020-12-07 ENCOUNTER — Other Ambulatory Visit: Payer: Self-pay

## 2020-12-07 DIAGNOSIS — Z510 Encounter for antineoplastic radiation therapy: Secondary | ICD-10-CM | POA: Diagnosis not present

## 2020-12-08 ENCOUNTER — Ambulatory Visit
Admission: RE | Admit: 2020-12-08 | Discharge: 2020-12-08 | Disposition: A | Discharge status: Home / Self Care | Payer: Medicare HMO | Source: Ambulatory Visit | Attending: Radiation Oncology | Admitting: Radiation Oncology

## 2020-12-08 DIAGNOSIS — Z510 Encounter for antineoplastic radiation therapy: Secondary | ICD-10-CM | POA: Diagnosis not present

## 2020-12-11 ENCOUNTER — Ambulatory Visit
Admission: RE | Admit: 2020-12-11 | Discharge: 2020-12-11 | Disposition: A | Discharge status: Home / Self Care | Payer: Medicare HMO | Source: Ambulatory Visit | Attending: Radiation Oncology | Admitting: Radiation Oncology

## 2020-12-11 ENCOUNTER — Other Ambulatory Visit: Payer: Self-pay

## 2020-12-11 DIAGNOSIS — Z510 Encounter for antineoplastic radiation therapy: Secondary | ICD-10-CM | POA: Diagnosis not present

## 2020-12-11 DIAGNOSIS — C8211 Follicular lymphoma grade II, lymph nodes of head, face, and neck: Secondary | ICD-10-CM

## 2020-12-11 MED ORDER — SONAFINE EX EMUL
1.0000 | Freq: Two times a day (BID) | CUTANEOUS | Status: DC
Start: 2020-12-11 — End: 2020-12-12
  Administered 2020-12-11: 1 via TOPICAL

## 2020-12-11 NOTE — Progress Notes (Signed)
Pt here for patient teaching.    Pt given Radiation and You booklet, Managing Acute Radiation Side Effects for Head and Neck Cancer handout, skin care instructions, and Sonafine.    Reviewed areas of pertinence such as fatigue, hair loss, mouth changes, skin changes, throat changes, earaches, and taste changes .   Pt able to give teach back of to pat skin, use unscented/gentle soap, and drink plenty of water,apply Sonafine bid, avoid applying anything to skin within 4 hours of treatment, and to use an electric razor if they must shave.   Pt demonstrated understanding and verbalizes understanding of information given and will contact nursing with any questions or concerns.    Http://rtanswers.org/treatmentinformation/whattoexpect/index         

## 2020-12-12 ENCOUNTER — Ambulatory Visit
Admission: RE | Admit: 2020-12-12 | Discharge: 2020-12-12 | Disposition: A | Discharge status: Home / Self Care | Payer: Medicare HMO | Source: Ambulatory Visit | Attending: Radiation Oncology | Admitting: Radiation Oncology

## 2020-12-12 DIAGNOSIS — Z510 Encounter for antineoplastic radiation therapy: Secondary | ICD-10-CM | POA: Diagnosis not present

## 2020-12-13 ENCOUNTER — Other Ambulatory Visit: Payer: Self-pay

## 2020-12-13 ENCOUNTER — Ambulatory Visit
Admission: RE | Admit: 2020-12-13 | Discharge: 2020-12-13 | Disposition: A | Discharge status: Home / Self Care | Payer: Medicare HMO | Source: Ambulatory Visit | Attending: Radiation Oncology | Admitting: Radiation Oncology

## 2020-12-13 DIAGNOSIS — Z510 Encounter for antineoplastic radiation therapy: Secondary | ICD-10-CM | POA: Diagnosis not present

## 2020-12-14 ENCOUNTER — Ambulatory Visit
Admission: RE | Admit: 2020-12-14 | Discharge: 2020-12-14 | Disposition: A | Discharge status: Home / Self Care | Payer: Medicare HMO | Source: Ambulatory Visit | Attending: Radiation Oncology | Admitting: Radiation Oncology

## 2020-12-14 DIAGNOSIS — Z510 Encounter for antineoplastic radiation therapy: Secondary | ICD-10-CM | POA: Diagnosis not present

## 2020-12-15 ENCOUNTER — Other Ambulatory Visit: Payer: Self-pay

## 2020-12-15 ENCOUNTER — Ambulatory Visit
Admission: RE | Admit: 2020-12-15 | Discharge: 2020-12-15 | Disposition: A | Discharge status: Home / Self Care | Payer: Medicare HMO | Source: Ambulatory Visit | Attending: Radiation Oncology | Admitting: Radiation Oncology

## 2020-12-15 DIAGNOSIS — Z510 Encounter for antineoplastic radiation therapy: Secondary | ICD-10-CM | POA: Diagnosis not present

## 2020-12-18 ENCOUNTER — Other Ambulatory Visit: Payer: Self-pay

## 2020-12-18 ENCOUNTER — Ambulatory Visit
Admission: RE | Admit: 2020-12-18 | Discharge: 2020-12-18 | Disposition: A | Discharge status: Home / Self Care | Payer: Medicare HMO | Source: Ambulatory Visit | Attending: Radiation Oncology | Admitting: Radiation Oncology

## 2020-12-18 DIAGNOSIS — Z510 Encounter for antineoplastic radiation therapy: Secondary | ICD-10-CM | POA: Diagnosis not present

## 2020-12-19 ENCOUNTER — Ambulatory Visit
Admission: RE | Admit: 2020-12-19 | Discharge: 2020-12-19 | Disposition: A | Discharge status: Home / Self Care | Payer: Medicare HMO | Source: Ambulatory Visit | Attending: Radiation Oncology | Admitting: Radiation Oncology

## 2020-12-19 DIAGNOSIS — Z510 Encounter for antineoplastic radiation therapy: Secondary | ICD-10-CM | POA: Diagnosis not present

## 2020-12-20 ENCOUNTER — Other Ambulatory Visit: Payer: Self-pay

## 2020-12-20 ENCOUNTER — Ambulatory Visit
Admission: RE | Admit: 2020-12-20 | Discharge: 2020-12-20 | Disposition: A | Discharge status: Home / Self Care | Payer: Medicare HMO | Source: Ambulatory Visit | Attending: Radiation Oncology | Admitting: Radiation Oncology

## 2020-12-20 DIAGNOSIS — Z510 Encounter for antineoplastic radiation therapy: Secondary | ICD-10-CM | POA: Diagnosis not present

## 2020-12-21 ENCOUNTER — Ambulatory Visit
Admission: RE | Admit: 2020-12-21 | Discharge: 2020-12-21 | Disposition: A | Discharge status: Home / Self Care | Payer: Medicare HMO | Source: Ambulatory Visit | Attending: Radiation Oncology | Admitting: Radiation Oncology

## 2020-12-21 DIAGNOSIS — Z510 Encounter for antineoplastic radiation therapy: Secondary | ICD-10-CM | POA: Diagnosis not present

## 2020-12-22 ENCOUNTER — Ambulatory Visit
Admission: RE | Admit: 2020-12-22 | Discharge: 2020-12-22 | Disposition: A | Discharge status: Home / Self Care | Payer: Medicare HMO | Source: Ambulatory Visit | Attending: Radiation Oncology | Admitting: Radiation Oncology

## 2020-12-22 ENCOUNTER — Other Ambulatory Visit: Payer: Self-pay

## 2020-12-22 DIAGNOSIS — C8211 Follicular lymphoma grade II, lymph nodes of head, face, and neck: Secondary | ICD-10-CM | POA: Insufficient documentation

## 2020-12-22 DIAGNOSIS — Z510 Encounter for antineoplastic radiation therapy: Secondary | ICD-10-CM | POA: Diagnosis present

## 2020-12-26 ENCOUNTER — Ambulatory Visit
Admission: RE | Admit: 2020-12-26 | Discharge: 2020-12-26 | Disposition: A | Discharge status: Home / Self Care | Payer: Medicare HMO | Source: Ambulatory Visit | Attending: Radiation Oncology | Admitting: Radiation Oncology

## 2020-12-26 ENCOUNTER — Other Ambulatory Visit: Payer: Self-pay

## 2020-12-26 DIAGNOSIS — Z510 Encounter for antineoplastic radiation therapy: Secondary | ICD-10-CM | POA: Diagnosis not present

## 2020-12-27 ENCOUNTER — Ambulatory Visit
Admission: RE | Admit: 2020-12-27 | Discharge: 2020-12-27 | Disposition: A | Discharge status: Home / Self Care | Payer: Medicare HMO | Source: Ambulatory Visit | Attending: Radiation Oncology | Admitting: Radiation Oncology

## 2020-12-27 ENCOUNTER — Other Ambulatory Visit: Payer: Self-pay

## 2020-12-27 DIAGNOSIS — Z510 Encounter for antineoplastic radiation therapy: Secondary | ICD-10-CM | POA: Diagnosis not present

## 2020-12-28 ENCOUNTER — Ambulatory Visit
Admission: RE | Admit: 2020-12-28 | Discharge: 2020-12-28 | Disposition: A | Discharge status: Home / Self Care | Payer: Medicare HMO | Source: Ambulatory Visit | Attending: Radiation Oncology | Admitting: Radiation Oncology

## 2020-12-28 DIAGNOSIS — Z510 Encounter for antineoplastic radiation therapy: Secondary | ICD-10-CM | POA: Diagnosis not present

## 2020-12-29 ENCOUNTER — Encounter: Payer: Self-pay | Admitting: Radiation Oncology

## 2020-12-29 ENCOUNTER — Other Ambulatory Visit: Payer: Self-pay

## 2020-12-29 ENCOUNTER — Ambulatory Visit
Admission: RE | Admit: 2020-12-29 | Discharge: 2020-12-29 | Disposition: A | Discharge status: Home / Self Care | Payer: Medicare HMO | Source: Ambulatory Visit | Attending: Radiation Oncology | Admitting: Radiation Oncology

## 2020-12-29 DIAGNOSIS — C8211 Follicular lymphoma grade II, lymph nodes of head, face, and neck: Secondary | ICD-10-CM

## 2020-12-29 DIAGNOSIS — Z510 Encounter for antineoplastic radiation therapy: Secondary | ICD-10-CM | POA: Diagnosis not present

## 2020-12-29 NOTE — Progress Notes (Signed)
Oncology Nurse Navigator Documentation   Met with Jeffery Wilson before his final RT to offer support and to celebrate end of radiation treatment.   Provided verbal/written post-RT guidance: Importance of keeping follow-up appts with Nutrition. Importance of protecting treatment area from sun. Continuation of Sonafine application 2-3 times daily, application of antibiotic ointment to areas of raw skin; when supply of Sonafine exhausted transition to OTC lotion with vitamin E. I have notified Dr. Raynelle Dick office that he completed today and would be ready for possible tooth/teeth extraction soon as originally planned.  Explained my role as navigator will continue for several more months, encouraged him to call me with needs/concerns.    Harlow Asa RN, BSN, OCN Head & Neck Oncology Nurse [place] at [facility] Phone # [phone]  Fax # [phone]

## 2021-01-05 ENCOUNTER — Encounter: Payer: Self-pay | Admitting: General Practice

## 2021-01-05 NOTE — Progress Notes (Signed)
[place] CSW Progress Notes  Call from wife, Therisa Doyne.  She is concerned about medical bills they are receiving for patient's care.  They are concerned about "very high copays."  Says that both she and husband are [place], live paycheck to paycheck.  Feels they cannot afford bills that are coming in for his treatments.    Advised her to call billing to discuss options available through [facility].  Also gave contact information for [facility] [place] and [place] for copay assistance options depending on availability of open funds.  Also messaged [employer] to determine if they are able to assist in any way.  Edwyna Shell, LCSW Clinical Social Worker Phone:  [phone]

## 2021-01-16 ENCOUNTER — Ambulatory Visit: Payer: Medicare HMO | Admitting: Radiation Oncology

## 2021-01-17 ENCOUNTER — Telehealth: Payer: Self-pay | Admitting: *Deleted

## 2021-01-17 NOTE — Telephone Encounter (Signed)
CALLED PATIENT TO ASK ABOUT RESCHEDULING FU THAT WAS MISSED ON 01-16-21, SPOKE WITH PATIENT'S WIFE- LYDIA AND SHE AND HER HUSBAND AGREED TO 02-07-21 @ 11:20 AM

## 2021-01-19 ENCOUNTER — Ambulatory Visit
Admission: EM | Admit: 2021-01-19 | Discharge: 2021-01-19 | Disposition: A | Discharge status: Home / Self Care | Payer: Medicare HMO | Attending: Emergency Medicine | Admitting: Emergency Medicine

## 2021-01-19 ENCOUNTER — Other Ambulatory Visit: Payer: Self-pay

## 2021-01-19 ENCOUNTER — Encounter: Payer: Self-pay | Admitting: Emergency Medicine

## 2021-01-19 DIAGNOSIS — M109 Gout, unspecified: Secondary | ICD-10-CM | POA: Diagnosis not present

## 2021-01-19 MED ORDER — TRAMADOL HCL 50 MG PO TABS: 50.0000 mg | ORAL_TABLET | Freq: Four times a day (QID) | ORAL | 0 refills | Status: AC | PRN

## 2021-01-19 MED ORDER — PREDNISONE 10 MG (21) PO TBPK: ORAL_TABLET | Freq: Every day | ORAL | 0 refills | Status: DC

## 2021-01-19 NOTE — ED Triage Notes (Signed)
Pt here with right foot pain starting with erythema at base of great toe. Swelling throughout foot and ankle. States the swelling in calf is chronic in nature however. Pt presents with crutches and unable to bear weight.

## 2021-01-19 NOTE — Discharge Instructions (Addendum)
Begin prednisone taper x 12 days Tramadol for severe pain-take with stool softener-over-the-counter Colace, drink plenty of water and fiber Ice and elevate foot Return if not improving or worsening

## 2021-01-19 NOTE — ED Provider Notes (Signed)
[facility]    CSN: [id number] Arrival date & time: 01/19/21  1312      History   Chief Complaint Chief Complaint  Patient presents with   Foot Pain    HPI Jeffery Wilson is a 70 y.o. male history of hypertension, CKD stage III, lymphoma, presenting today for evaluation of right foot pain.  Reports over the past week he has developed pain at the base of his great toe.  Denies injury or trauma.  Symptoms of progressed.  Reports history of similar in the past and worked up for gout, but no official diagnosis.  Reports sometimes will feel symptoms in his right ankle as well.  Denies fevers.  Using Tylenol without relief.  Using crutches to assist with ambulation.  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 Authorizing Provider  predniSONE (STERAPRED UNI-PAK 21 TAB) 10 MG (21) TBPK tablet Take by mouth daily. Take 6 tabs by mouth daily  for 2 days, then 5 tabs for 2 days, then 4 tabs for 2 days, then 3 tabs for 2 days, 2 tabs for 2 days, then 1 tab by mouth daily for 2 days 01/19/21  Yes Lamyra Malcolm C, PA-C  traMADol (ULTRAM) 50 MG tablet Take 1 tablet (50 mg total) by mouth every 6 (six) hours as needed. 01/19/21  Yes Brihany Butch C, PA-C  acetaminophen (TYLENOL) 500 MG tablet Take 1,000 mg by mouth every 6 (six) hours as needed for moderate pain or headache.    [provider]  Artificial Tear Solution (GENTEAL TEARS OP) Place 1 drop into both eyes at bedtime.    [provider]  [employer]  Extract [phone] PSORIASIS MEDICATED EX) Apply 1 application topically daily.    [provider]  furosemide (LASIX) 40 MG tablet Take 40 mg by mouth daily.     [provider]  loratadine (CLARITIN) 10 MG tablet Take 1 tablet by mouth daily. 11/10/20   [provider]  metoprolol succinate (TOPROL-XL) 50 MG 24 hr tablet Take 50 mg by mouth daily. Take with or immediately following a meal.    [provider]  Milk Thistle 1000 MG CAPS Take 1 tablet by mouth.    [provider]  Multiple Vitamins-Minerals (MULTIVITAMIN PO) Take 1 tablet by mouth daily.    [provider]  Omega-3 Fatty Acids (OMEGA 3 PO) Take 1 capsule by mouth 2 (two) times daily.    [provider]  pravastatin (PRAVACHOL) 20 MG tablet Take 20 mg by mouth at bedtime.     [provider]  Turmeric 500 MG CAPS Take 500 mg by mouth daily.    [provider]    Family History History reviewed. No pertinent family history.  Social History Social History   Tobacco Use   Smoking status: Never   Smokeless tobacco: Never  Vaping Use   Vaping Use:  Never used  Substance Use Topics   Alcohol use: Yes    Comment: daily    Drug use: No     Allergies   Patient has no known allergies.   Review of Systems Review of Systems  Constitutional:  Negative for fatigue and fever.  Eyes:  Negative for redness, itching and visual disturbance.  Respiratory:  Negative for shortness of breath.   Cardiovascular:  Negative for chest pain and leg swelling.  Gastrointestinal:  Negative for nausea and vomiting.  Musculoskeletal:  Positive for arthralgias and joint swelling. Negative for myalgias.  Skin:  Positive for color change. Negative for rash and wound.  Neurological:  Negative for dizziness, syncope, weakness, light-headedness and headaches.    Physical Exam Triage Vital Signs ED Triage Vitals  Enc Vitals Group     BP 01/19/21 1325 (!) 162/90      Pulse Rate 01/19/21 1325 71     Resp 01/19/21 1325 20     Temp 01/19/21 1325 98.1 F (36.7 C)     Temp Source 01/19/21 1325 Oral     SpO2 01/19/21 1325 98 %     Weight --      Height --      Head Circumference --      Peak Flow --      Pain Score 01/19/21 1326 10     Pain Loc --      Pain Edu? --      Excl. in [place]? --    No data found.  Updated Vital Signs BP (!) 162/90   Pulse 71   Temp 98.1 F (36.7 C) (Oral)   Resp 20   SpO2 98%   Visual Acuity Right Eye Distance:   Left Eye Distance:   Bilateral Distance:    Right Eye Near:   Left Eye Near:    Bilateral Near:     Physical Exam Vitals and nursing note reviewed.  Constitutional:      Appearance: He is well-developed.     Comments: No acute distress  HENT:     Head: Normocephalic and atraumatic.     Nose: Nose normal.  Eyes:     Conjunctiva/sclera: Conjunctivae normal.  Cardiovascular:     Rate and Rhythm: Normal rate.  Pulmonary:     Effort: Pulmonary effort is normal. No respiratory distress.  Abdominal:     General: There is no distension.  Musculoskeletal:        General: Normal range of motion.     Cervical back: Neck supple.     Comments: Right foot: Erythema warmth and swelling noted about base of great toe at MTP joint, localized in this area and is not expanding proximally, dorsalis pedis 2+  Skin:    General: Skin is warm and dry.  Neurological:     Mental Status: He is alert and oriented to person, place, and time.     UC Treatments / Results  Labs (all labs ordered are listed, but only abnormal results are displayed) Labs Reviewed - No data to display  EKG   Radiology No results found.  Procedures Procedures (including critical care time)  Medications Ordered in UC Medications - No data to display  Initial Impression / Assessment and Plan / UC Course  I have reviewed the triage vital signs and the nursing notes.  Pertinent labs & imaging results that were available during my  care of the patient were reviewed by me and considered in my medical decision making (see  chart for details).     Exam and history consistent with likely gout, has CKD will avoid NSAIDs and will proceed with prednisone taper x12 days, tramadol for severe pain ice elevate and continue to use crutches for comfort.  Low suspicion of cellulitis at this time, no mechanism of injury.  Discussed strict return precautions. Patient verbalized understanding and is agreeable with plan.  Final Clinical Impressions(s) / UC Diagnoses   Final diagnoses:  Acute gout involving toe of right foot, unspecified cause     Discharge Instructions      Begin prednisone taper x 12 days Tramadol for severe pain-take with stool softener-over-the-counter Colace, drink plenty of water and fiber Ice and elevate foot Return if not improving or worsening      ED Prescriptions     Medication Sig Dispense Auth. Provider   predniSONE (STERAPRED UNI-PAK 21 TAB) 10 MG (21) TBPK tablet Take by mouth daily. Take 6 tabs by mouth daily  for 2 days, then 5 tabs for 2 days, then 4 tabs for 2 days, then 3 tabs for 2 days, 2 tabs for 2 days, then 1 tab by mouth daily for 2 days 42 tablet Kosisochukwu Burningham C, PA-C   traMADol (ULTRAM) 50 MG tablet Take 1 tablet (50 mg total) by mouth every 6 (six) hours as needed. 10 tablet Rise Traeger, [place] C, PA-C      I have reviewed the PDMP during this encounter.   Janith Lima, [place] 01/19/21 1537

## 2021-01-23 ENCOUNTER — Other Ambulatory Visit (HOSPITAL_COMMUNITY): Payer: Medicare HMO | Admitting: Dentistry

## 2021-02-07 ENCOUNTER — Ambulatory Visit
Admission: RE | Admit: 2021-02-07 | Discharge: 2021-02-07 | Disposition: A | Discharge status: Home / Self Care | Payer: Medicare HMO | Source: Ambulatory Visit | Attending: Radiation Oncology | Admitting: Radiation Oncology

## 2021-02-07 ENCOUNTER — Other Ambulatory Visit: Payer: Self-pay

## 2021-02-07 VITALS — BP 146/91 | HR 65 | Temp 96.7°F | Resp 20 | Ht 71.0 in | Wt 255.2 lb

## 2021-02-07 DIAGNOSIS — Z923 Personal history of irradiation: Secondary | ICD-10-CM | POA: Insufficient documentation

## 2021-02-07 DIAGNOSIS — R51 Headache with orthostatic component, not elsewhere classified: Secondary | ICD-10-CM | POA: Diagnosis not present

## 2021-02-07 DIAGNOSIS — C8211 Follicular lymphoma grade II, lymph nodes of head, face, and neck: Secondary | ICD-10-CM | POA: Diagnosis present

## 2021-02-07 DIAGNOSIS — F17210 Nicotine dependence, cigarettes, uncomplicated: Secondary | ICD-10-CM | POA: Diagnosis not present

## 2021-02-07 NOTE — Progress Notes (Signed)
Mr. Koob presents today for follow-up after completing radiation to the left side of his neck on 12/29/2020  Pain issues, if any: Patient denies any lingering throat or mouth pain. Reports occasional mild headaches when he first wakes up in the morning, but states they resolve after he takes OTC Tylenol Using a feeding tube?: N/A Weight changes, if any:  Wt Readings from Last 3 Encounters:  02/07/21 255 lb 4 oz (115.8 kg)  11/22/20 256 lb 8 oz (116.3 kg)  11/14/20 261 lb 3.2 oz (118.5 kg)   Swallowing issues, if any: Patient denies. Reports he is able eat/drink a wide variety  Smoking or chewing tobacco? Patient denies Using fluoride trays daily? N/A--scheduled for F/U with Dr. Benson Norway 02/19/2021 Last ENT visit was on: Not since diagnosis Other notable issues, if any: Noticed that area along the left side of his neck/cheek has become swollen again (denies discomfort with palpation). Denies signs of swelling/lymphedema elsewhere. Denies any ear or jaw pain, or difficulty opening his mouth. Reports altered sense of taste and dry mouth have both improved. Had a new gout flare up in his right foot/toe last month that made it difficult to walk; went to ED and was given pain medication and a steroid taper. Scheduled for F/U with Dr. [name] 03/20/2021  Vitals:   02/07/21 1134  BP: (!) 146/91  Pulse: 65  Resp: 20  Temp: (!) 96.7 F (35.9 C)  SpO2: 98%

## 2021-02-09 ENCOUNTER — Encounter: Payer: Self-pay | Admitting: Radiation Oncology

## 2021-02-09 NOTE — Progress Notes (Addendum)
Radiation Oncology         [phone] ________________________________  Name: Jeffery Wilson MRN: [id number]  Date: 02/07/2021  DOB: [birth year]  Follow-Up Visit Note  CC: [name], MD  Brunetta Genera, MD  Diagnosis and Prior Radiotherapy:       ICD-10-CM   1. 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  Jeffery Wilson presents today for follow-up after completing radiation to the left side of his neck on 12/29/2020  Pain issues, if any: Patient denies any lingering throat or mouth pain. Reports occasional mild headaches when he first wakes up in the morning, but states they resolve after he takes OTC Tylenol Using a feeding tube?: N/A Weight changes, if any:  Wt Readings from Last 3 Encounters:  02/07/21 255 lb 4 oz (115.8 kg)  11/22/20 256 lb 8 oz (116.3 kg)  11/14/20 261 lb 3.2 oz (118.5 kg)   Swallowing issues, if any: Patient denies. Reports he is able eat/drink a wide variety  Smoking or chewing tobacco? Patient denies Using fluoride trays daily? N/A--scheduled for F/U with Dr. Benson Norway 02/19/2021 Last ENT visit was on: Not since diagnosis Other notable issues, if any: Noticed that area along the left side of his neck/cheek shrank down during and after RT and has become swollen again (denies discomfort with palpation). Denies signs of swelling/lymphedema elsewhere. Denies any ear or jaw pain, or difficulty opening his mouth. Reports altered sense of taste and dry mouth have both improved. Had a new gout flare up in his right foot/toe last month that made it difficult to walk; went to ED and was given pain medication and a steroid taper. Scheduled for F/U with Dr. [name] 03/20/2021  Vitals:   02/07/21 1134  BP: (!) 146/91  Pulse: 65  Resp: 20  Temp: (!) 96.7 F (35.9 C)  SpO2: 98%                       ALLERGIES:  has No Known  Allergies.  Meds: Current Outpatient Medications  Medication Sig Dispense Refill   acetaminophen (TYLENOL) 500 MG tablet Take 1,000 mg by mouth every 6 (six) hours as needed for moderate pain or headache.     Artificial Tear Solution (GENTEAL TEARS OP) Place 1 drop into both eyes at bedtime.     Coal Tar Extract [phone] PSORIASIS MEDICATED EX) Apply 1 application topically daily.     furosemide (LASIX) 40 MG tablet Take 40 mg by mouth daily.      loratadine (CLARITIN) 10 MG tablet Take 1 tablet by mouth daily.     metoprolol succinate (TOPROL-XL) 50 MG 24 hr tablet Take 50 mg by mouth daily. Take with or immediately following a meal.     Milk Thistle 1000 MG CAPS Take 1 tablet by mouth.     Multiple Vitamins-Minerals (MULTIVITAMIN PO) Take 1 tablet by mouth daily.     Omega-3 Fatty Acids (OMEGA 3 PO) Take 1 capsule by mouth 2 (two) times daily.     pravastatin (PRAVACHOL) 20 MG tablet Take 20 mg by mouth at bedtime.      traMADol (ULTRAM) 50 MG tablet Take 1 tablet (50 mg total) by mouth every 6 (six) hours as needed. 10 tablet 0   Turmeric 500 MG CAPS Take 500 mg by mouth daily.  No current facility-administered medications for this encounter.    Physical Findings: The patient is in no acute distress. Patient is alert and oriented. Wt Readings from Last 3 Encounters:  02/07/21 255 lb 4 oz (115.8 kg)  11/22/20 256 lb 8 oz (116.3 kg)  11/14/20 261 lb 3.2 oz (118.5 kg)    height is '5\' 11"'$  (1.803 m) and weight is 255 lb 4 oz (115.8 kg). His temporal temperature is 96.7 F (35.9 C) (abnormal). His blood pressure is 146/91 (abnormal) and his pulse is 65. His respiration is 20 and oxygen saturation is 98%. .  General: Alert and oriented, in no acute distress HEENT: Head is normocephalic. Extraocular movements are intact.   Neck: Neck is notable for mass at left level II, jaw; it appears slightly larger than it was at end of RT.  But still much smaller than prior to RT.  No  cellulitis Skin: Skin in treatment fields shows satisfactory healing  Psychiatric: Judgment and insight are intact. Affect is appropriate.      Lab Findings: Lab Results  Component Value Date   WBC 6.7 11/14/2020   HGB 15.1 11/14/2020   HCT 45.2 11/14/2020   MCV 98.5 11/14/2020   PLT 212 11/14/2020    No results found for: TSH  Radiographic Findings: No results found.  Impression/Plan:    Head and Neck Cancer Status: his mass shrank dramatically from RT appears to be growing again; this could be due to lymphedema or necrosis, but we need to r/o progressive lymphoma; we will get a CT neck w cont to assess further. Patient knows biopsy may be considered depending on results.  I will call him after CT report is back. He expressed gratitude for this visit and agrees to plan.  He is still smoking 1 cig daily; I urged him to stop. He asked about nutritional techniques to reduce risk of tumor progression and I told him to continue normal diet.  On date of service, in total, I spent 20 minutes on this encounter. Patient was seen in person. _____________________________________   [name], MD

## 2021-02-12 ENCOUNTER — Other Ambulatory Visit: Payer: Self-pay

## 2021-02-12 DIAGNOSIS — C8211 Follicular lymphoma grade II, lymph nodes of head, face, and neck: Secondary | ICD-10-CM

## 2021-02-13 ENCOUNTER — Ambulatory Visit (HOSPITAL_COMMUNITY)
Admission: RE | Admit: 2021-02-13 | Discharge: 2021-02-13 | Disposition: A | Discharge status: Home / Self Care | Payer: Medicare HMO | Source: Ambulatory Visit | Attending: Radiation Oncology | Admitting: Radiation Oncology

## 2021-02-13 ENCOUNTER — Other Ambulatory Visit: Payer: Self-pay

## 2021-02-13 ENCOUNTER — Inpatient Hospital Stay: Payer: Medicare HMO | Attending: Hematology

## 2021-02-13 DIAGNOSIS — Z923 Personal history of irradiation: Secondary | ICD-10-CM | POA: Insufficient documentation

## 2021-02-13 DIAGNOSIS — Z9221 Personal history of antineoplastic chemotherapy: Secondary | ICD-10-CM | POA: Insufficient documentation

## 2021-02-13 DIAGNOSIS — C8211 Follicular lymphoma grade II, lymph nodes of head, face, and neck: Secondary | ICD-10-CM | POA: Diagnosis present

## 2021-02-13 LAB — CBC WITH DIFFERENTIAL (CANCER CENTER ONLY)
Abs Immature Granulocytes: 0.02 10*3/uL (ref 0.00–0.07)
Basophils Absolute: 0 10*3/uL (ref 0.0–0.1)
Basophils Relative: 0 %
Eosinophils Absolute: 0.2 10*3/uL (ref 0.0–0.5)
Eosinophils Relative: 4 %
HCT: 43.6 % (ref 39.0–52.0)
Hemoglobin: 14.9 g/dL (ref 13.0–17.0)
Immature Granulocytes: 0 %
Lymphocytes Relative: 22 %
Lymphs Abs: 1 10*3/uL (ref 0.7–4.0)
MCH: 33.2 pg (ref 26.0–34.0)
MCHC: 34.2 g/dL (ref 30.0–36.0)
MCV: 97.1 fL (ref 80.0–100.0)
Monocytes Absolute: 0.6 10*3/uL (ref 0.1–1.0)
Monocytes Relative: 12 %
Neutro Abs: 2.8 10*3/uL (ref 1.7–7.7)
Neutrophils Relative %: 62 %
Platelet Count: 160 10*3/uL (ref 150–400)
RBC: 4.49 MIL/uL (ref 4.22–5.81)
RDW: 13.2 % (ref 11.5–15.5)
WBC Count: 4.5 10*3/uL (ref 4.0–10.5)
nRBC: 0 % (ref 0.0–0.2)

## 2021-02-13 LAB — CMP (CANCER CENTER ONLY)
ALT: 21 U/L (ref 0–44)
AST: 21 U/L (ref 15–41)
Albumin: 4.1 g/dL (ref 3.5–5.0)
Alkaline Phosphatase: 61 U/L (ref 38–126)
Anion gap: 11 (ref 5–15)
BUN: 18 mg/dL (ref 8–23)
CO2: 27 mmol/L (ref 22–32)
Calcium: 9.7 mg/dL (ref 8.9–10.3)
Chloride: 102 mmol/L (ref 98–111)
Creatinine: 1.45 mg/dL — ABNORMAL HIGH (ref 0.61–1.24)
GFR, Estimated: 52 mL/min — ABNORMAL LOW (ref >60)
Glucose, Bld: 91 mg/dL (ref 70–99)
Potassium: 3.7 mmol/L (ref 3.5–5.1)
Sodium: 140 mmol/L (ref 135–145)
Total Bilirubin: 0.6 mg/dL (ref 0.3–1.2)
Total Protein: 7.3 g/dL (ref 6.5–8.1)

## 2021-02-13 LAB — LACTATE DEHYDROGENASE: LDH: 155 U/L (ref 98–192)

## 2021-02-13 MED ORDER — IOHEXOL 350 MG/ML SOLN
60.0000 mL | Freq: Once | INTRAVENOUS | Status: AC | PRN
  Administered 2021-02-13: 60 mL via INTRAVENOUS

## 2021-02-14 ENCOUNTER — Encounter: Payer: Self-pay | Admitting: Radiation Oncology

## 2021-02-14 NOTE — Addendum Note (Signed)
Encounter addended by: [name], MD on: 02/14/2021 4:01 PM  Actions taken: Delete clinical note

## 2021-02-14 NOTE — Progress Notes (Signed)
I called the patient and spoke with him and his wife about his CT results.  I recommend that we get tissue to establish what we are dealing with.  I let them know we will make a referral to ENT for biopsy.  They were thankful for the call and agreeable to the plan.   Note sent to team, below:   Jeffery Wilson, please refer patient to Dr. Constance Wilson for biopsy of growing neck mass (lymphoma) that shrank but then grew again weeks after RT.   Jeffery Wilson, pt has had a stubborn neck mass (Follicular lymphoma, grade 1-2 of 3) that didn't procure a durable response to Rituxan. I gave him a significant dose of RT this summer that seemed to induce a brisk response on physical exam, but then it started to grow again. He got about half the dose we use for squamous ca, but this should be enough for a CR.   As you will see, in 2019 core needle bx by IR  was not diagnostic so Jeffery Wilson had to biopsy it himself. I suspect you may need to get more tissue than a core to figure out if this is reactive vs resistant. I called the patient to explain the we are referring to you for workup.   Ccing Dr. Irene Wilson from med/onc so he is aware.    Thanks SS

## 2021-02-14 NOTE — Progress Notes (Deleted)
I called the patient and spoke with him and his wife about his CT results.  I recommend that we get tissue to establish what we are dealing with.  I let them know we will make a referral to ENT for biopsy.  They were thankful for the call and agreeable to the plan.  Note sent to team, below:  Anderson Malta, please refer patient to Dr. [name] for biopsy of growing neck mass (lymphoma) that shrank but then grew again weeks after RT.  Merry Proud, pt has had a stubborn neck mass (Follicular lymphoma, grade 1-2 of 3) that didn't procure a durable response to Rituxan. I gave him a significant dose of RT this summer that seemed to induce a brisk response on physical exam, but then it started to grow again. He got about half the dose we use for squamous ca, but this should be enough for a CR.  As you will see, in 2019 core needle bx by IR  was not diagnostic so Macky Lower had to biopsy it himself. I suspect you may need to get more tissue than a core to figure out if this is reactive vs resistant. I called the patient to explain the we are referring to you for workup.  Ccing Dr. [name] from med/onc so he is aware.   Thanks SS

## 2021-02-15 ENCOUNTER — Other Ambulatory Visit: Payer: Self-pay

## 2021-02-15 DIAGNOSIS — C8211 Follicular lymphoma grade II, lymph nodes of head, face, and neck: Secondary | ICD-10-CM

## 2021-02-15 NOTE — Progress Notes (Signed)
blatory

## 2021-02-19 ENCOUNTER — Other Ambulatory Visit: Payer: Self-pay

## 2021-02-19 ENCOUNTER — Encounter (HOSPITAL_COMMUNITY): Payer: Self-pay | Admitting: Dentistry

## 2021-02-19 ENCOUNTER — Ambulatory Visit (INDEPENDENT_AMBULATORY_CARE_PROVIDER_SITE_OTHER): Payer: Medicare HMO | Admitting: Dentistry

## 2021-02-19 DIAGNOSIS — K036 Deposits [accretions] on teeth: Secondary | ICD-10-CM

## 2021-02-19 DIAGNOSIS — K0381 Cracked tooth: Secondary | ICD-10-CM

## 2021-02-19 DIAGNOSIS — K030 Excessive attrition of teeth: Secondary | ICD-10-CM

## 2021-02-19 DIAGNOSIS — C8211 Follicular lymphoma grade II, lymph nodes of head, face, and neck: Secondary | ICD-10-CM | POA: Diagnosis not present

## 2021-02-19 DIAGNOSIS — K0401 Reversible pulpitis: Secondary | ICD-10-CM

## 2021-02-19 DIAGNOSIS — K032 Erosion of teeth: Secondary | ICD-10-CM

## 2021-02-19 DIAGNOSIS — K06020 Generalized gingival recession, unspecified: Secondary | ICD-10-CM

## 2021-02-19 DIAGNOSIS — K08109 Complete loss of teeth, unspecified cause, unspecified class: Secondary | ICD-10-CM

## 2021-02-19 NOTE — Progress Notes (Signed)
                                                                                                                                                             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   Plan: The patient will follow-up with radiation oncology in 6mo  -----------------------------------  SEppie Gibson MD

## 2021-02-19 NOTE — Progress Notes (Signed)
Department of Dental Medicine      LIMITED EXAM  Service Date:   02/19/2021  Patient Name:   Jeffery Wilson Date of Birth:   [birth year] Medical Record Number: [id number]  Referring Provider:                [name], M.D.        TODAY'S VISIT:   Assessment:   Tooth #20 cracked tooth syndrome.  Generalized calculus accumulation, localized inflamed gingival tissue  Plan:   Restorative on tooth #20, periodic exam and cleaning at next visit.  Discussed in detail all treatment options and recommendations with the patient and they are agreeable to the plan.    Thank you for consulting with hospital dentistry and for the opportunity to participate in this patient's treatment.  Should you have any questions or concerns, please contact the [facility] at [phone].        PROGRESS NOTE:   COVID 19 SCREENING:  The patient denies symptoms concerning for COVID-19 infection including fever, chills, cough, or newly developed shortness of breath.   HISTORY OF PRESENT ILLNESS: Jeffery Wilson is a very pleasant 70 y.o. male with h/o hypertension, renal disease and cancer (lymphoma) who was recently diagnosed with follicular lymphoma of lymph nodes of neck s/p radiotherapy from 12/06/20 through 12/29/20 who presents today for a limited exam to evaluate tooth #20. This was the patient's chief complaint prior to radiation therapy, but definitive treatment and plan was postponed until after his treatment was completed.    DENTAL HISTORY: The patient reports that tooth #20 still bothers him, but on occasion and not as frequently.  He says that he thinks this is because he avoids chewing on the left side and primarily chews on the right.  He says that he has been doing okay since he finished his radiation therapy and has no side effects pertaining to radiation as discussed at his initial outpatient dental consultation.  He says that he does have regrowth of the mass on the left side  of his neck that will possibly need further biopsying in the near future.  He currently denies any dental/orofacial pain or sensitivity. Patient is able to manage oral secretions.  Patient denies dysphagia, odynophagia, dysphonia, SOB and neck pain.  Patient denies fever, rigors and malaise.   CHIEF COMPLAINT:  Here to discuss treatment options for tooth #20 and would like a cleaning.   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     Artificial Tear Solution (GENTEAL TEARS OP) Place 1 drop into both eyes at bedtime.     Coal Tar Extract [phone] PSORIASIS MEDICATED EX) Apply 1 application topically daily.     furosemide (LASIX) 40 MG tablet Take 40 mg by mouth daily.      loratadine (CLARITIN) 10 MG tablet Take 1 tablet by  mouth daily.     metoprolol succinate (TOPROL-XL) 50 MG 24 hr tablet Take 50 mg by mouth daily. Take with or immediately following a meal.     Milk Thistle 1000 MG CAPS Take 1 tablet by mouth.     Multiple Vitamins-Minerals (MULTIVITAMIN PO) Take 1 tablet by mouth daily.     Omega-3 Fatty Acids (OMEGA 3 PO) Take 1 capsule by mouth 2 (two) times daily.     pravastatin (PRAVACHOL) 20 MG tablet Take 20 mg by mouth at bedtime.      traMADol (ULTRAM) 50 MG tablet Take 1 tablet (50 mg total) by mouth every 6 (six) hours as needed. 10 tablet 0   Turmeric 500 MG CAPS Take 500 mg by mouth daily.     No current  facility-administered medications for this visit.    LABS: Lab Results  Component Value Date   WBC 4.5 02/13/2021   HGB 14.9 02/13/2021   HCT 43.6 02/13/2021   MCV 97.1 02/13/2021   PLT 160 02/13/2021      Component Value Date/Time   NA 140 02/13/2021 1438   K 3.7 02/13/2021 1438   CL 102 02/13/2021 1438   CO2 27 02/13/2021 1438   GLUCOSE 91 02/13/2021 1438   BUN 18 02/13/2021 1438   CREATININE 1.45 (H) 02/13/2021 1438   CALCIUM 9.7 02/13/2021 1438   GFRNONAA 52 (L) 02/13/2021 1438   GFRAA 57 (L) 03/06/2020 0919   Lab Results  Component Value Date   INR 1.05 01/28/2018   No results found for: PTT  Social History   Socioeconomic History   Marital status: Married    Spouse name: Not on file   Number of children: Not on file   Years of education: Not on file   Highest education level: Not on file  Occupational History   Not on file  Tobacco Use   Smoking status: Never   Smokeless tobacco: Never  Vaping Use   Vaping Use: Never used  Substance and Sexual Activity   Alcohol use: Yes    Comment: daily    Drug use: No   Sexual activity: Not on file  Other Topics Concern   Not on file  Social History Narrative   Not on file   Social Determinants of Health   Financial Resource Strain: Not on file  Food Insecurity: Not on file  Transportation Needs: Not on file  Physical Activity: Not on file  Stress: Not on file  Social Connections: Not on file  Intimate Partner Violence: Not on file   History reviewed. No pertinent family history.   REVIEW OF SYSTEMS:  Reviewed with the patient as per HPI. Psych: Patient denies having dental phobia.   VITAL SIGNS: BP 138/80 (BP Location: Right Arm, Patient Position: Sitting, Cuff Size: Normal)   Pulse (!) 59   Temp 98.8 F (37.1 C) (Oral)    PHYSICAL EXAM: General:  Well-developed, comfortable and in no apparent distress. Neurological:  Alert and oriented to person, place and  time. Extraoral:  Facial  symmetry present without any edema or erythema. TMJ asymptomatic without clicks or crepitations. (+) Neck on left side notable for mass, visually enlarged with no erythema. Maximum Interincisal Opening:  60 mm Intraoral:  Soft tissues appear well-perfused and mucous membranes moist.  FOM and vestibules soft and not raised. Oral cavity without mass or lesion. No signs of infection, parulis, sinus tract, edema or erythema evident upon exam.   DENTAL EXAM:  Hard tissue (limited) exam  completed and charted. Overall impression:  Good remaining dentition. Oral hygiene:  Fair   Periodontal:  Pink, healthy gingival tissue with blunted papilla with localized areas of inflamed gingival tissue due to calculus accumulation.  Gingival recession. Endodontics:   #20 has clinically visible crack through occlusal surface of tooth spanning from distal marginal ridge through mesial marginal ridge. Vitality testing/other testing:  Tested teeth numbers 20, 21 and 22 for percussion, palpation, caries, periodontal probing depths, mobility, air/water and bite/release cracked tooth test.  (++) Tooth #20 positive response to bite/release test. Other findings:  (+) Attrition/wear- #22-#27 incisal; (+) Abfraction(s)- #21B(V)   RADIOGRAPHIC EXAM:  Updated BW and periapical image of tooth #20 exposed and interpreted.  #20 has moderate horizontal bone loss and appears slightly supra-erupted with no opposing dentition; #21 and #22 have mild horizontal bone loss. Missing teeth numbers 12, 13, 15 and 19. Existing restorations evident on teeth numbers 14 and 20. #18 drifting towards mesial.   ASSESSMENT:  1.  History of head and neck radiation therapy 2.  Follicular lymphoma of lymph nodes of neck 3.  #20 reversible pulpitis (cracked tooth syndrome) 4.   Gingival recession, generalized 5.   Accretions on teeth 6.   Attrition/wear 7.   Abfraction/flexure 8.   Missing teeth   PLAN AND RECOMMENDATIONS: I discussed the  risks, benefits, and complications of various scenarios with the patient in relationship to their medical and dental conditions.  I explained all significant findings of the dental consultation with the patient including tooth #20 having a crack through the top of the tooth which may or may not be into the nerve of the tooth yet, and the recommended care including attempting to fill the tooth with a standard filling material to bond the crack if it is not too deep, not treating the tooth at this time since it is not infected or carious, extracting the tooth vs root canal therapy+crown if the crack is too deep and has gone into the nerve.   The patient verbalized understanding of all findings, discussion, and different treatment options and currently wishes to proceed with restorative on tooth #20 and if this is unsuccessful, extraction of tooth #20 vs root canal therapy+crown.  He also inquired about having a regular dental exam and cleaning which we would be happy to complete all periodic exam/periodontal charting, adult prophy and #20 filling at his next appointment if time permits.  He is agreeable to this plan. Plan to discuss all findings and recommendations with medical team and coordinate future care as needed.  All questions and concerns were invited and addressed.  The patient tolerated today's visit well and departed in stable condition.  Porter Benson Norway, D.M.D.

## 2021-02-20 NOTE — Progress Notes (Signed)
Oncology Nurse Navigator Documentation   Jeffery Wilson has been scheduled to see Dr. [name] at [facility] on 02/22/21 per Dr. Pearlie Oyster request after her physical exam during his radiation treatment follow up on 02/14/21.   Harlow Asa RN, BSN, OCN Head & Neck Oncology Nurse [place] at [facility] Phone # [phone]  Fax # [phone]

## 2021-02-28 ENCOUNTER — Encounter (HOSPITAL_COMMUNITY): Payer: Self-pay | Admitting: Dentistry

## 2021-02-28 ENCOUNTER — Ambulatory Visit (INDEPENDENT_AMBULATORY_CARE_PROVIDER_SITE_OTHER): Payer: Self-pay | Admitting: Dentistry

## 2021-02-28 ENCOUNTER — Other Ambulatory Visit: Payer: Self-pay

## 2021-02-28 DIAGNOSIS — K030 Excessive attrition of teeth: Secondary | ICD-10-CM

## 2021-02-28 DIAGNOSIS — C8211 Follicular lymphoma grade II, lymph nodes of head, face, and neck: Secondary | ICD-10-CM

## 2021-02-28 DIAGNOSIS — K032 Erosion of teeth: Secondary | ICD-10-CM

## 2021-02-28 DIAGNOSIS — K036 Deposits [accretions] on teeth: Secondary | ICD-10-CM

## 2021-02-28 DIAGNOSIS — Z7189 Other specified counseling: Secondary | ICD-10-CM

## 2021-02-28 DIAGNOSIS — K06010 Localized gingival recession, unspecified: Secondary | ICD-10-CM

## 2021-02-28 DIAGNOSIS — Z0120 Encounter for dental examination and cleaning without abnormal findings: Secondary | ICD-10-CM

## 2021-02-28 DIAGNOSIS — K0530 Chronic periodontitis, unspecified: Secondary | ICD-10-CM

## 2021-02-28 DIAGNOSIS — K0850 Unsatisfactory restoration of tooth, unspecified: Secondary | ICD-10-CM

## 2021-02-28 DIAGNOSIS — K029 Dental caries, unspecified: Secondary | ICD-10-CM

## 2021-02-28 DIAGNOSIS — K08109 Complete loss of teeth, unspecified cause, unspecified class: Secondary | ICD-10-CM

## 2021-02-28 DIAGNOSIS — K0889 Other specified disorders of teeth and supporting structures: Secondary | ICD-10-CM

## 2021-02-28 DIAGNOSIS — K0381 Cracked tooth: Secondary | ICD-10-CM

## 2021-02-28 NOTE — Progress Notes (Signed)
Department of Dental Medicine      DENTAL APPOINTMENT  Service Date:   02/28/2021  Patient Name:   Jeffery Wilson Date of Birth:   1950/07/22 Medical Record Number: [id number]         TODAY'S VISIT:   Procedures:  Comprehensive exam, adult prophylaxis, restorative on tooth #20 Assessment: Soft tissue:  WNL Caries risk:  High Periodontal impression:  Localized severe periodontitis (tooth #31)   Plan: Next visit: Restorative per treatment plan       PROGRESS NOTE:   COVID-19 SCREENING:  The patient denies symptoms concerning for COVID-19 infection including fever, chills, cough, or newly developed shortness of breath.   HISTORY OF PRESENT ILLNESS: Jeffery Wilson is a very pleasant 70 y.o. 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   DENTAL HISTORY: The patient reports that tooth #20 still bothers him, but not frequently because he continues to not chew on the left side. At his last dental visit he said that he does have regrowth on the left side of his neck that will possibly need surgery vs biopsy in the near future.  He says today that he is still waiting on this appointment to be scheduled and is hoping someone will contact him by the end of this week.  He currently denies any dental/orofacial pain or sensitivity. Patient is able to manage oral secretions.  Patient denies dysphagia, odynophagia, dysphonia, SOB and neck pain.  Patient denies fever, rigors and malaise.   CHIEF COMPLAINT:  Here for a comprehensive exam, cleaning and a filling on #20.  Patient with no complaints.   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     Artificial Tear Solution (GENTEAL TEARS OP) Place 1 drop into both eyes at bedtime.     Coal Tar Extract [id number] PSORIASIS MEDICATED EX) Apply 1 application topically daily.     furosemide (LASIX) 40 MG tablet Take 40 mg by mouth daily.      loratadine (CLARITIN) 10 MG tablet Take 1 tablet by mouth daily.     metoprolol succinate (TOPROL-XL) 50 MG 24 hr tablet Take 50 mg by mouth daily. Take with or immediately following a meal.     Milk Thistle 1000 MG CAPS Take 1 tablet by mouth.     Multiple Vitamins-Minerals (MULTIVITAMIN PO) Take 1 tablet by mouth daily.     Omega-3 Fatty Acids (OMEGA 3 PO) Take 1 capsule by mouth 2 (two) times daily.     pravastatin (PRAVACHOL) 20 MG tablet Take 20 mg by mouth at bedtime.      traMADol (ULTRAM) 50 MG tablet Take  1 tablet (50 mg total) by mouth every 6 (six) hours as needed. 10 tablet 0   Turmeric 500 MG CAPS Take 500 mg by mouth daily.     No current facility-administered medications for this visit.    LABS: Lab Results  Component Value Date   WBC 4.5 02/13/2021   HGB 14.9 02/13/2021   HCT 43.6 02/13/2021   MCV 97.1 02/13/2021   PLT 160 02/13/2021      Component Value Date/Time   NA 140 02/13/2021 1438   K 3.7 02/13/2021 1438   CL 102 02/13/2021 1438   CO2 27 02/13/2021 1438   GLUCOSE 91 02/13/2021 1438   BUN 18 02/13/2021 1438   CREATININE 1.45 (H) 02/13/2021 1438   CALCIUM 9.7 02/13/2021 1438   GFRNONAA 52 (L) 02/13/2021 1438   GFRAA 57 (L) 03/06/2020 0919   Lab Results  Component  Value Date   INR 1.05 01/28/2018   No results found for: PTT  Social History   Socioeconomic History   Marital status: Married    Spouse name: Not on file   Number of children: Not on file   Years of education: Not on file   Highest education level: Not on file  Occupational History   Not on file  Tobacco Use   Smoking status: Never   Smokeless tobacco: Never  Vaping Use   Vaping Use: Never used  Substance and Sexual Activity   Alcohol use: Yes    Comment: daily    Drug use: No   Sexual activity: Not on file  Other Topics Concern   Not on file  Social History Narrative   Not on file   Social Determinants of Health   Financial Resource Strain: Not on file  Food Insecurity: Not on file  Transportation Needs: Not on file  Physical Activity: Not on file  Stress: Not on file  Social Connections: Not on file  Intimate Partner Violence: Not on file   No family history on file.   REVIEW OF SYSTEMS:  Reviewed with the patient as per HPI. Psych: Patient denies having dental phobia.   VITAL SIGNS: BP (!) 141/84 (BP Location: Right Arm, Patient Position: Sitting, Cuff Size: Normal)   Pulse (!) 57   Temp 99.2 F (37.3 C) (Oral)    PHYSICAL EXAM:  Soft tissue exam completed and charted.  General:  Well-developed, comfortable and in no apparent distress. Neurological:  Alert and oriented to person, place and  time. Extraoral:  Head size, head shape, facial symmetry, skin, complexion, pigmentation, conjunctiva, oral labia, parotid gland, and submandibular gland WNL.   (+) Lymph nodes: Neck on left side notable for mass, visually enlarged with no erythema.   No swelling or erythema.  TMJ asymptomatic without clicks or crepitations.  Maximum Interincisal Opening:  60 mm (measured at previous dental visit) Intraoral:  Soft tissues appear well-perfused and mucous membranes moist.  FOM and vestibules soft and not raised. Oral cavity without mass or lesion. No signs of  infection, parulis, sinus tract, edema or erythema evident upon exam.   DENTAL EXAM:  Hard tissue and periodontal exam completed and all changes were charted.  Overall impression:  Fair remaining dentition. Oral hygiene:  Fair   Caries:  Restorable:  #1, #3, #18, #21, #28 Defective Restorations:  #3 fractured part of occlusal amalgam causing leakage and poor retention of remaining filling. Removable/Fixed Prosthodontics:  Patient denies wearing partial dentures. Occlusion:  Unable to assess molar occlusion.  Non-functional teeth numbers 1,  14, 20 and 21.  Supra-erupted teeth numbers 1 and 14. Other positive findings:  Attrition/wear - #6-#11 incisal, #22-#27 incisal;  Abfraction(s) - #4B(V), #5B(V), #21B(V) and #28B(V); Cracked/broken - #1 crack on lingual groove, #20 crack across occlusal surface spanning from distal through mesial marginal ridge. Rotation - #31 is rotated to the distal.   Periodontal:  Probing depths:  Range between 2-5 mm with localized 10 mm probing depth recorded on direct lingual of tooth #31. Plaque:  Slight, generalized accumulation. Calculus:  Localized, mild. Mobility:  Class I - #2, #14, #18, #28; Class II - #1, #31 Periodontally compromised teeth:  #1, #2, #14, #18, #31 Gingival appearance:  Pink, healthy gingival tissue with blunted papilla.  Exam performed by [facility] B. Benson Norway, DMD with Leta Speller, DAII acting as scribe.   RADIOGRAPHIC EXAM:  None taken at today's visit.   ASSESSMENT:  1.  History of head and neck radiation 2.  Comprehensive dental exam and cleaning 3.  Tooth #20 cracked tooth syndrome 4.  Missing teeth 5.   Caries 6.   Accretions on teeth 7.   Periodontitis (localized severe) 8.   Loose teeth 9.   Defective dental restorations 10.   Attrition/wear 11.   Abfraction/flexure 12.  Gingival recession   PROCEDURES:  Restorative treatment on tooth #20 MOD: Anesthesia: Topical:  Benzocaine 20% applied Type of anesthesia  used:  34 mg lidocaine, 0.018 mg epinephrine Location:  #20 infiltration Aspiration negative. Composite restoration: Cotton roll isolation. Excavated crack from occlusal surface which spanned from mesial to distal marginal ridge. Tooth number #20 prepared for composite. The extent of the crack was into dentin. Etched enamel and dentin surfaces  with 32% phosphoric acid for 15 seconds and rinsed thoroughly. Removed excess water with a brief burst of air. Optibond bonding agent placed and air dried until no movement of bonding agent was seen. Repeated and then light cured for 10 seconds. Composite shade A3.5 material placed in increments and light-cured. Removed excess material with carbide finishing bur. Occlusion, margins and contact were verified and adjusted as needed.  Restoration finished and polished. The patient was advised of possible normal sensitivity to hot and cold for the next few days/weeks.  Adult prophylaxis: Removed all supra- and subgingival calculus and plaque using Cavitron and hand instruments. Polished all teeth.  Flossed. Oral hygiene instruction discussed with the patient. Toothbrush, toothpaste and floss given to the patient.   PLAN AND RECOMMENDATIONS: I explained all significant findings of the dental exam with the patient including the deep 10 mm probing depth on tooth #31 and few other cavities, and the recommended care including localized scaling and root planing on tooth #31 and fillings on all other indicated teeth.  The patient verbalized understanding of all findings, discussion, and recommendations and is agreeable to the plan.  Next visit:  Restorative per treatment plan, SRP #31  All questions and concerns were invited and addressed.  The patient tolerated today's visit well and departed in stable condition.  Turrell Benson Norway, D.M.D.

## 2021-03-02 NOTE — H&P (Signed)
HPI:   Jeffery Wilson is a 71 y.o. male who presents as a consult Patient.   Referring Provider: Acquanetta Belling*  Chief complaint: Lymphoma.  HPI: He was originally diagnosed with lymphoma of the left submandibular region in 2019. He had incisional biopsy by Dr. Vicie Mutters at [facility]. He underwent chemotherapy and subsequent radiation. He had complete response clinically when he completed radiation in July but then about a month later started having a new mass arise on the left side in the same area. He is here by request of the oncology team for possible reincisional biopsy.  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, right   Cancer (HCC)   Chronic renal disease, stage 3, moderately decreased glomerular filtration rate (GFR) between 30-59 mL/min/1.73 square meter (HCC)   GERD with esophagitis   Hammertoe of left foot   Hyperlipidemia   Hypertension   Localized swelling, mass and lump, head   Obesity   Osteoarthritis of both knees   Past Surgical History:  Procedure Laterality Date   LYMPH NODE EXCISIONAL BIOPSY CERVICAL Left 04/24/2018  Procedure: CERVICAL LYMPH NODE incisional BIOPSY; Surgeon: Marijo Conception, MD; Location: [facility]; Service: ENT; Laterality: Left;   No family history of bleeding disorders, wound healing problems or difficulty with anesthesia.   Social History   Socioeconomic History   Marital status: Married  Spouse name: Not on file   Number of children: Not on file   Years of education: Not on file   Highest education level: Not on file  Occupational History   Not on file  Tobacco Use   Smoking status: Current Some Day Smoker  Packs/day: 0.00  Types: Cigarettes  Last attempt to quit: 02/11/2013  Years since quitting: 8.0   Smokeless tobacco: Never Used  Vaping Use   Vaping Use: Never used  Substance and Sexual Activity   Alcohol use: Yes   Alcohol/week: 2.0 standard drinks  Types: 2 Shots of liquor (1.5 oz. of 80-proof spirits) per week   Drug use: No   Sexual activity: Not on file  Other Topics Concern   Not on file  Social History Narrative   Not on file   Social Determinants of Health   Financial Resource Strain: Not on file  Food Insecurity: Not on file  Transportation Needs: Not on file  Physical Activity: Not on file  Stress: Not on file  Social Connections: Not on file  Housing Stability: Not on file   Current Outpatient Medications:   acetaminophen (TYLENOL) 650 MG CR tablet, Take 650 mg by mouth nightly. , Disp: , Rfl:   dextran/hypromellose/glycerin (ARTIFICIAL TEAR,DXTRN-HPM-GLY, OPHT), Apply 1 drop to eye., Disp: , Rfl:   dicyclomine (BENTYL) 20 mg tablet, Take 20 mg by mouth., Disp: , Rfl:   furosemide (LASIX) 40 MG tablet, TAKE 1 TABLET EVERY DAY, Disp: , Rfl:   metoPROLOL succinate (TOPROL-XL) 50 MG 24 hr tablet, TAKE 1 TABLET ONE TIME DAILY, Disp: , Rfl:   milk thistle 175 mg tablet, Take 175 mg by mouth daily., Disp: , Rfl:   multivitamin with minerals tablet, Take by mouth., Disp: , Rfl:   omega-3/dha/epa/dpa/fish oil (OMEGA-3 2100 ORAL), Take 1 capsule by mouth., Disp: , Rfl:   pravastatin (PRAVACHOL) 20 MG tablet, TAKE 1 TABLET EVERY DAY, Disp: , Rfl:   traMADoL (ULTRAM) 50 mg tablet, , Disp: , Rfl:   turmeric-turmeric root extract 450-50 mg Cap, Take  500 mg by mouth., Disp: , Rfl:   A complete ROS was performed with pertinent positives/negatives noted in the HPI. The remainder of the ROS are negative.   Physical Exam:   BP 161/83  Pulse 61  Temp 97.7 F (36.5 C)  Ht 1.803 m ('5\' 11"'$ )  Wt 116.1 kg (256 lb)  BMI 35.70 kg/m   General: Healthy and alert, in no distress, breathing easily. Normal affect. In a pleasant mood. Head: Normocephalic, atraumatic. No masses, or scars. Eyes: Pupils are equal, and reactive to light. Vision is grossly intact. No spontaneous or gaze nystagmus. Ears:  Ear canals are clear. Tympanic membranes are intact, with normal landmarks and the middle ears are clear and healthy. Hearing: Grossly normal. Nose: Nasal cavities are clear with healthy mucosa, no polyps or exudate. Airways are patent. Face: No masses or scars, facial nerve function is symmetric. Oral Cavity: No mucosal abnormalities are noted. Tongue with normal mobility. Dentition appears healthy. Oropharynx: Tonsils are symmetric. There are no mucosal masses identified. Tongue base appears normal and healthy. Larynx/Hypopharynx: deferred Chest: Deferred Neck: The left submandibular gland is slightly enlarged and firm but there is no obvious tumor. There is an obvious rubbery tumor involving the inferior aspect of the left parotid, no thyroid nodules or enlargement. Neuro: Cranial nerves II-XII with normal function. Balance: Normal gate. Other findings: none.  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 Tissue diagnosis is required. Given the location of this we had a long discussion about possible surgical approaches. I think the safest option is going to be to prepare for a parotidectomy with facial nerve dissection, but start with the inferior aspect of the incision to expose the inferior most portion of the tumor. If I am able to get an adequate sample without stimulating the facial nerve then that will be all that is required but if I have facial nerve stimulation I may need to perform a traditional parotidectomy with facial nerve dissection. This can all be done in the outpatient center with possible overnight stay. He understands the risks and benefits of the options. All questions were answered.

## 2021-03-02 NOTE — Progress Notes (Addendum)
Surgical Instructions    Your procedure is scheduled on Wednesday, March 07, 2021 at 10:45 AM.  Report to [facility] Main Entrance "A" at 8:45 A.M., then check in with the Admitting office.  Call this number if you have problems the morning of surgery:  [phone]   If you have any questions prior to your surgery date call [phone]: Open Monday-Friday 8am-4pm    Remember:  Do not eat or drink after midnight the night before your surgery     Take these medicines the morning of surgery with A SIP OF WATER:  metoprolol succinate (TOPROL-XL)  IF NEEDED: ISOPTO TEARS / GONIOVISC) traMADol (ULTRAM)  As of today, STOP taking any Aspirin (unless otherwise instructed by your surgeon) Aleve, Naproxen, Ibuprofen, Motrin, Advil, Goody's, BC's, all herbal medications, fish oil, and all vitamins.          Do not wear jewelry. Do not wear lotions, powders, colognes, or deodorant. Men may shave face and neck. Do not bring valuables to the hospital.             [facility] is not responsible for any belongings or valuables.  Do NOT Smoke (Tobacco/Vaping)  24 hours prior to your procedure If you use a CPAP at night, you may bring your mask for your overnight stay.   Contacts, glasses, dentures or bridgework may not be worn into surgery, please bring cases for these belongings   For patients admitted to the hospital, discharge time will be determined by your treatment team.   Patients discharged the day of surgery will not be allowed to drive home, and someone needs to stay with them for 24 hours.  ONLY 1 SUPPORT PERSON MAY BE PRESENT WHILE YOU ARE IN SURGERY. IF YOU ARE TO BE ADMITTED ONCE YOU ARE IN YOUR ROOM YOU WILL BE ALLOWED TWO (2) VISITORS.  Minor children may have two parents present. Special consideration for safety and communication needs will be reviewed on a case by case basis.  Special instructions:    Oral Hygiene is also important to reduce your risk of infection.   Remember - BRUSH YOUR TEETH THE MORNING OF SURGERY WITH YOUR REGULAR TOOTHPASTE   [place]- Preparing For Surgery  Before surgery, you can play an important role. Because skin is not sterile, your skin needs to be as free of germs as possible. You can reduce the number of germs on your skin by washing with CHG (chlorahexidine gluconate) Soap before surgery.  CHG is an antiseptic cleaner which kills germs and bonds with the skin to continue killing germs even after washing.     Please do not use if you have an allergy to CHG or antibacterial soaps. If your skin becomes reddened/irritated stop using the CHG.  Do not shave (including legs and underarms) for at least 48 hours prior to first CHG shower. It is OK to shave your face.  Please follow these instructions carefully.     Shower the NIGHT BEFORE SURGERY and the MORNING OF SURGERY with CHG Soap.   If you chose to wash your hair, wash your hair first as usual with your normal shampoo. After you shampoo, rinse your hair and body thoroughly to remove the shampoo.  Then [employer] and genitals (private parts) with your normal soap and rinse thoroughly to remove soap.  After that Use CHG Soap as you would any other liquid soap. You can apply CHG directly to the skin and wash gently with a scrungie or a  clean washcloth.   Apply the CHG Soap to your body ONLY FROM THE NECK DOWN.  Do not use on open wounds or open sores. Avoid contact with your eyes, ears, mouth and genitals (private parts). Wash Face and genitals (private parts)  with your normal soap.   Wash thoroughly, paying special attention to the area where your surgery will be performed.  Thoroughly rinse your body with warm water from the neck down.  DO NOT shower/wash with your normal soap after using and rinsing off the CHG Soap.  Pat yourself dry with a CLEAN TOWEL.  Wear CLEAN PAJAMAS to bed the night before surgery  Place CLEAN SHEETS on your bed the night before your  surgery  DO NOT SLEEP WITH PETS.   Day of Surgery:  Take a shower with CHG soap. Wear Clean/Comfortable clothing the morning of surgery Do not apply any deodorants/lotions.   Remember to brush your teeth WITH YOUR REGULAR TOOTHPASTE.   Please read over the following fact sheets that you were given.

## 2021-03-05 ENCOUNTER — Other Ambulatory Visit: Payer: Self-pay

## 2021-03-05 ENCOUNTER — Encounter (HOSPITAL_COMMUNITY)
Admission: RE | Admit: 2021-03-05 | Discharge: 2021-03-05 | Disposition: A | Discharge status: Home / Self Care | Payer: Medicare HMO | Source: Ambulatory Visit | Attending: Otolaryngology | Admitting: Otolaryngology

## 2021-03-05 ENCOUNTER — Encounter (HOSPITAL_COMMUNITY): Payer: Self-pay

## 2021-03-05 DIAGNOSIS — Z01818 Encounter for other preprocedural examination: Secondary | ICD-10-CM | POA: Insufficient documentation

## 2021-03-05 DIAGNOSIS — Z20822 Contact with and (suspected) exposure to covid-19: Secondary | ICD-10-CM | POA: Diagnosis not present

## 2021-03-05 DIAGNOSIS — R7302 Impaired glucose tolerance (oral): Secondary | ICD-10-CM | POA: Insufficient documentation

## 2021-03-05 HISTORY — DX: Prediabetes: R73.03

## 2021-03-05 HISTORY — DX: Non-Hodgkin lymphoma, unspecified, unspecified site: C85.90

## 2021-03-05 HISTORY — DX: Malignant (primary) neoplasm, unspecified: C80.1

## 2021-03-05 LAB — SARS CORONAVIRUS 2 (TAT 6-24 HRS): SARS Coronavirus 2: NEGATIVE

## 2021-03-05 LAB — HEMOGLOBIN A1C
Hgb A1c MFr Bld: 5.9 % — ABNORMAL HIGH (ref 4.8–5.6)
Mean Plasma Glucose: 122.63 mg/dL

## 2021-03-05 NOTE — Progress Notes (Signed)
PCP - Dr. Melissa Montane Cardiologist - Denies Oncology: Dr. [name]  PPM/ICD - Denies  Chest x-ray - N/A EKG - 03/05/21 Stress Test - Denies ECHO - Denies Cardiac Cath - Denies  Sleep Study - Denies Patient's OSA score 5; results sent to PCP.  Patient states he is pre-diabetic.  Blood Thinner Instructions: N/A Aspirin Instructions: N/A  ERAS Protcol - No  COVID TEST- 03/05/21 - Done in PAT   Anesthesia review: No  Patient denies shortness of breath, fever, cough and chest pain at PAT appointment   All instructions explained to the patient, with a verbal understanding of the material. Patient agrees to go over the instructions while at home for a better understanding. Patient also instructed to self quarantine after being tested for COVID-19. The opportunity to ask questions was provided.

## 2021-03-05 NOTE — Progress Notes (Signed)
   03/05/21 1006  OBSTRUCTIVE SLEEP APNEA  Have you ever been diagnosed with sleep apnea through a sleep study? No  Do you snore loudly (loud enough to be heard through closed doors)?  0  Do you often feel tired, fatigued, or sleepy during the daytime (such as falling asleep during driving or talking to someone)? 0  Has anyone observed you stop breathing during your sleep? 0  Do you have, or are you being treated for high blood pressure? 1  BMI more than 35 kg/m2? 1  Age > 50 (1-yes) 1  Neck circumference greater than:Male 16 inches or larger, Male 17inches or larger? 1  Male Gender (Yes=1) 1  Obstructive Sleep Apnea Score 5

## 2021-03-07 ENCOUNTER — Ambulatory Visit (HOSPITAL_COMMUNITY): Payer: Medicare HMO | Admitting: Anesthesiology

## 2021-03-07 ENCOUNTER — Other Ambulatory Visit: Payer: Self-pay

## 2021-03-07 ENCOUNTER — Ambulatory Visit (HOSPITAL_COMMUNITY)
Admission: RE | Admit: 2021-03-07 | Discharge: 2021-03-07 | Disposition: A | Discharge status: Home / Self Care | Payer: Medicare HMO | Attending: Otolaryngology | Admitting: Otolaryngology

## 2021-03-07 ENCOUNTER — Encounter (HOSPITAL_COMMUNITY): Payer: Self-pay | Admitting: Otolaryngology

## 2021-03-07 ENCOUNTER — Encounter (HOSPITAL_COMMUNITY)
Admission: RE | Disposition: A | Discharge status: Home / Self Care | Payer: Self-pay | Source: Home / Self Care | Attending: Otolaryngology

## 2021-03-07 ENCOUNTER — Ambulatory Visit (HOSPITAL_COMMUNITY): Payer: Medicare HMO | Admitting: Physician Assistant

## 2021-03-07 DIAGNOSIS — R221 Localized swelling, mass and lump, neck: Secondary | ICD-10-CM | POA: Insufficient documentation

## 2021-03-07 DIAGNOSIS — F17210 Nicotine dependence, cigarettes, uncomplicated: Secondary | ICD-10-CM | POA: Insufficient documentation

## 2021-03-07 DIAGNOSIS — Z79899 Other long term (current) drug therapy: Secondary | ICD-10-CM | POA: Diagnosis not present

## 2021-03-07 DIAGNOSIS — Z923 Personal history of irradiation: Secondary | ICD-10-CM | POA: Insufficient documentation

## 2021-03-07 DIAGNOSIS — Z8572 Personal history of non-Hodgkin lymphomas: Secondary | ICD-10-CM | POA: Insufficient documentation

## 2021-03-07 HISTORY — PX: PAROTIDECTOMY: SHX2163

## 2021-03-07 LAB — GLUCOSE, CAPILLARY: Glucose-Capillary: 101 mg/dL — ABNORMAL HIGH (ref 70–99)

## 2021-03-07 SURGERY — EXCISION, PAROTID GLAND
Anesthesia: General | Site: Face | Laterality: Left

## 2021-03-07 MED ORDER — OXYCODONE HCL 5 MG PO TABS
5.0000 mg | ORAL_TABLET | Freq: Once | ORAL | Status: DC | PRN
Start: 2021-03-07 — End: 2021-03-07

## 2021-03-07 MED ORDER — LIDOCAINE-EPINEPHRINE 1 %-1:100000 IJ SOLN
INTRAMUSCULAR | Status: AC
  Filled 2021-03-07: qty 1

## 2021-03-07 MED ORDER — BACITRACIN ZINC 500 UNIT/GM EX OINT
TOPICAL_OINTMENT | CUTANEOUS | Status: AC
  Filled 2021-03-07: qty 28.35

## 2021-03-07 MED ORDER — DEXAMETHASONE SODIUM PHOSPHATE 10 MG/ML IJ SOLN
INTRAMUSCULAR | Status: DC | PRN
  Administered 2021-03-07: 10 mg via INTRAVENOUS

## 2021-03-07 MED ORDER — ORAL CARE MOUTH RINSE: 15.0000 mL | Freq: Once | OROMUCOSAL | Status: AC

## 2021-03-07 MED ORDER — ACETAMINOPHEN 500 MG PO TABS
1000.0000 mg | ORAL_TABLET | Freq: Once | ORAL | Status: AC
  Administered 2021-03-07: 1000 mg via ORAL
  Filled 2021-03-07: qty 2

## 2021-03-07 MED ORDER — LIDOCAINE 2% (20 MG/ML) 5 ML SYRINGE
INTRAMUSCULAR | Status: DC | PRN
  Administered 2021-03-07: 80 mg via INTRAVENOUS

## 2021-03-07 MED ORDER — OXYCODONE HCL 5 MG/5ML PO SOLN: 5.0000 mg | Freq: Once | ORAL | Status: DC | PRN

## 2021-03-07 MED ORDER — HYDROCODONE-ACETAMINOPHEN 7.5-325 MG PO TABS: 1.0000 | ORAL_TABLET | Freq: Four times a day (QID) | ORAL | 0 refills | Status: AC | PRN

## 2021-03-07 MED ORDER — CHLORHEXIDINE GLUCONATE 0.12 % MT SOLN
15.0000 mL | Freq: Once | OROMUCOSAL | Status: AC
  Administered 2021-03-07: 15 mL via OROMUCOSAL
  Filled 2021-03-07: qty 15

## 2021-03-07 MED ORDER — 0.9 % SODIUM CHLORIDE (POUR BTL) OPTIME
TOPICAL | Status: DC | PRN
  Administered 2021-03-07: 1000 mL

## 2021-03-07 MED ORDER — DEXAMETHASONE SODIUM PHOSPHATE 10 MG/ML IJ SOLN
INTRAMUSCULAR | Status: AC
  Filled 2021-03-07: qty 1

## 2021-03-07 MED ORDER — PROPOFOL 10 MG/ML IV BOLUS
INTRAVENOUS | Status: DC | PRN
  Administered 2021-03-07: 150 mg via INTRAVENOUS
  Administered 2021-03-07: 50 mg via INTRAVENOUS

## 2021-03-07 MED ORDER — PROPOFOL 10 MG/ML IV BOLUS
INTRAVENOUS | Status: AC
  Filled 2021-03-07: qty 20

## 2021-03-07 MED ORDER — PROPOFOL 1000 MG/100ML IV EMUL
INTRAVENOUS | Status: AC
  Filled 2021-03-07: qty 100

## 2021-03-07 MED ORDER — PHENYLEPHRINE 40 MCG/ML (10ML) SYRINGE FOR IV PUSH (FOR BLOOD PRESSURE SUPPORT)
PREFILLED_SYRINGE | INTRAVENOUS | Status: AC
  Filled 2021-03-07: qty 10

## 2021-03-07 MED ORDER — ONDANSETRON HCL 4 MG/2ML IJ SOLN
INTRAMUSCULAR | Status: DC | PRN
  Administered 2021-03-07: 4 mg via INTRAVENOUS

## 2021-03-07 MED ORDER — ONDANSETRON HCL 4 MG/2ML IJ SOLN: 4.0000 mg | Freq: Once | INTRAMUSCULAR | Status: DC | PRN

## 2021-03-07 MED ORDER — MIDAZOLAM HCL 2 MG/2ML IJ SOLN
INTRAMUSCULAR | Status: AC
  Filled 2021-03-07: qty 2

## 2021-03-07 MED ORDER — PROPOFOL 500 MG/50ML IV EMUL
INTRAVENOUS | Status: DC | PRN
Start: 2021-03-07 — End: 2021-03-07
  Administered 2021-03-07: 75 ug/kg/min via INTRAVENOUS

## 2021-03-07 MED ORDER — ONDANSETRON HCL 4 MG/2ML IJ SOLN
INTRAMUSCULAR | Status: AC
  Filled 2021-03-07: qty 2

## 2021-03-07 MED ORDER — HYDROMORPHONE HCL 1 MG/ML IJ SOLN: 0.2500 mg | INTRAMUSCULAR | Status: DC | PRN

## 2021-03-07 MED ORDER — ONDANSETRON 8 MG PO TBDP: 8.0000 mg | ORAL_TABLET | Freq: Three times a day (TID) | ORAL | 0 refills | Status: AC | PRN

## 2021-03-07 MED ORDER — PHENYLEPHRINE 40 MCG/ML (10ML) SYRINGE FOR IV PUSH (FOR BLOOD PRESSURE SUPPORT)
PREFILLED_SYRINGE | INTRAVENOUS | Status: DC | PRN
  Administered 2021-03-07 (×2): 120 ug via INTRAVENOUS

## 2021-03-07 MED ORDER — LACTATED RINGERS IV SOLN: INTRAVENOUS | Status: DC

## 2021-03-07 MED ORDER — FENTANYL CITRATE (PF) 100 MCG/2ML IJ SOLN
INTRAMUSCULAR | Status: DC | PRN
  Administered 2021-03-07 (×3): 50 ug via INTRAVENOUS

## 2021-03-07 MED ORDER — SUCCINYLCHOLINE CHLORIDE 200 MG/10ML IV SOSY
PREFILLED_SYRINGE | INTRAVENOUS | Status: DC | PRN
  Administered 2021-03-07: 100 mg via INTRAVENOUS

## 2021-03-07 MED ORDER — EPHEDRINE SULFATE-NACL 50-0.9 MG/10ML-% IV SOSY
PREFILLED_SYRINGE | INTRAVENOUS | Status: DC | PRN
  Administered 2021-03-07 (×2): 10 mg via INTRAVENOUS
  Administered 2021-03-07: 5 mg via INTRAVENOUS

## 2021-03-07 MED ORDER — MIDAZOLAM HCL 2 MG/2ML IJ SOLN
INTRAMUSCULAR | Status: DC | PRN
  Administered 2021-03-07: 2 mg via INTRAVENOUS

## 2021-03-07 MED ORDER — EPHEDRINE 5 MG/ML INJ
INTRAVENOUS | Status: AC
  Filled 2021-03-07: qty 5

## 2021-03-07 MED ORDER — FENTANYL CITRATE (PF) 250 MCG/5ML IJ SOLN
INTRAMUSCULAR | Status: AC
  Filled 2021-03-07: qty 5

## 2021-03-07 SURGICAL SUPPLY — 51 items
ATTRACTOMAT 16X20 MAGNETIC DRP (DRAPES) IMPLANT
BAG COUNTER SPONGE SURGICOUNT (BAG) ×2 IMPLANT
BAG SURGICOUNT SPONGE COUNTING (BAG) ×1
BLADE SURG 15 STRL LF DISP TIS (BLADE) ×1 IMPLANT
BLADE SURG 15 STRL SS (BLADE) ×2
CANISTER SUCT 3000ML PPV (MISCELLANEOUS) ×3 IMPLANT
CLEANER TIP ELECTROSURG 2X2 (MISCELLANEOUS) ×3 IMPLANT
CNTNR URN SCR LID CUP LEK RST (MISCELLANEOUS) IMPLANT
CONT SPEC 4OZ STRL OR WHT (MISCELLANEOUS)
CORD BIPOLAR FORCEPS 12FT (ELECTRODE) ×3 IMPLANT
COVER SURGICAL LIGHT HANDLE (MISCELLANEOUS) ×3 IMPLANT
DERMABOND ADVANCED (GAUZE/BANDAGES/DRESSINGS) ×2
DERMABOND ADVANCED .7 DNX12 (GAUZE/BANDAGES/DRESSINGS) ×1 IMPLANT
DRAIN HEMOVAC 7FR (DRAIN) IMPLANT
DRAIN PENROSE 1/4X12 LTX STRL (WOUND CARE) ×3 IMPLANT
DRAIN SNY 10 ROU (WOUND CARE) IMPLANT
DRAIN WOUND SNY 15 RND (WOUND CARE) IMPLANT
DRAPE INCISE 23X17 IOBAN STRL (DRAPES) ×2
DRAPE INCISE IOBAN 23X17 STRL (DRAPES) ×1 IMPLANT
ELECT COATED BLADE 2.86 ST (ELECTRODE) ×3 IMPLANT
ELECT REM PT RETURN 9FT ADLT (ELECTROSURGICAL) ×3
ELECTRODE REM PT RTRN 9FT ADLT (ELECTROSURGICAL) ×1 IMPLANT
EVACUATOR SILICONE 100CC (DRAIN) IMPLANT
FORCEPS BIPOLAR SPETZLER 8 1.0 (NEUROSURGERY SUPPLIES) ×3 IMPLANT
GAUZE 4X4 16PLY ~~LOC~~+RFID DBL (SPONGE) ×3 IMPLANT
GAUZE SPONGE 4X4 12PLY STRL (GAUZE/BANDAGES/DRESSINGS) ×3 IMPLANT
GLOVE SURG LTX SZ7.5 (GLOVE) ×3 IMPLANT
GOWN STRL REUS W/ TWL LRG LVL3 (GOWN DISPOSABLE) ×2 IMPLANT
GOWN STRL REUS W/TWL LRG LVL3 (GOWN DISPOSABLE) ×4
HEMOSTAT SURGICEL 2X14 (HEMOSTASIS) ×3 IMPLANT
KIT BASIN OR (CUSTOM PROCEDURE TRAY) ×3 IMPLANT
KIT TURNOVER KIT B (KITS) ×3 IMPLANT
LOCATOR NERVE 3 VOLT (DISPOSABLE) IMPLANT
NEEDLE PRECISIONGLIDE 27X1.5 (NEEDLE) IMPLANT
NS IRRIG 1000ML POUR BTL (IV SOLUTION) ×3 IMPLANT
PAD ARMBOARD 7.5X6 YLW CONV (MISCELLANEOUS) ×6 IMPLANT
PENCIL FOOT CONTROL (ELECTRODE) ×3 IMPLANT
SHEARS HARMONIC 9CM CVD (BLADE) ×3 IMPLANT
STAPLER VISISTAT 35W (STAPLE) ×3 IMPLANT
SUT CHROMIC 3 0 SH 27 (SUTURE) ×3 IMPLANT
SUT CHROMIC 4 0 PS 2 18 (SUTURE) ×3 IMPLANT
SUT ETHILON 2 0 FS 18 (SUTURE) ×3 IMPLANT
SUT ETHILON 5 0 P 3 18 (SUTURE)
SUT NYLON ETHILON 5-0 P-3 1X18 (SUTURE) IMPLANT
SUT SILK 2 0 SH CR/8 (SUTURE) ×3 IMPLANT
SUT SILK 4 0 REEL (SUTURE) ×3 IMPLANT
SUT VIC AB 3-0 SH 18 (SUTURE) ×3 IMPLANT
SYR CONTROL 10ML LL (SYRINGE) IMPLANT
TAPE CLOTH SURG 4X10 WHT LF (GAUZE/BANDAGES/DRESSINGS) ×3 IMPLANT
TOWEL GREEN STERILE FF (TOWEL DISPOSABLE) ×3 IMPLANT
TRAY ENT MC OR (CUSTOM PROCEDURE TRAY) ×3 IMPLANT

## 2021-03-07 NOTE — Op Note (Signed)
OPERATIVE REPORT  DATE OF SURGERY: 03/07/2021  PATIENT:  Jeffery Wilson,  70 y.o. male  PRE-OPERATIVE DIAGNOSIS:  Mass of left submandibular region  POST-OPERATIVE DIAGNOSIS:  Mass of left submandibular region  PROCEDURE:  Procedure(s): LEFT PAROTID INCISIONAL BIOPSY   SURGEON:  Beckie Salts, MD  ASSISTANTS: RNFA  ANESTHESIA:   General   EBL: 50 ml  DRAINS: Quarter-inch [place]  LOCAL MEDICATIONS USED:  None  SPECIMEN: Left parotid mass for frozen section, negative for carcinoma, consistent with lymphoid process, lymphoma work-up to be performed.  COUNTS:  Correct  PROCEDURE DETAILS: The patient was taken to the operating room and placed on the operating table in the supine position. Following induction of general endotracheal anesthesia, the left face was prepped and draped in standard fashion.  A parotidectomy incision was outlined with a marking pen.  The lower half was used and incised with electrocautery.  A skin flap was developed anteriorly exposing the parotid gland and the underlying mass.  The great auricular nerve was felt to be just posterior to the resection.  Blunt dissection was then performed through the parotid tissue exposing the tumor mass.  Care was taken to observe for any facial movements.  The mass was identified and a large piece was dissected out, approximately 3 x 2 cm.  This was sent for pathologic evaluation.  The wound was irrigated with saline and hemostasis was completed using bipolar cautery.  There was a branch of the facial nerve lower division identified just deep to the level of the dissection which was left intact.  A small piece of Surgicel was placed into the anterior depth of the dissection to provide further hemostasis.  The drain was exited through the lower part of the incision secured in place with a nylon suture.  Deep closure was performed using interrupted 3-0 chromic.  Subcuticular running 3-0 chromic was then performed and Dermabond on  the skin.  Patient was awakened extubated and transferred to recovery in stable condition.    PATIENT DISPOSITION:  To PACU, stable

## 2021-03-07 NOTE — Anesthesia Preprocedure Evaluation (Addendum)
Anesthesia Evaluation  Patient identified by MRN, date of birth, ID band Patient awake    Reviewed: Allergy & Precautions, NPO status , Patient's Chart, lab work & pertinent test results, reviewed documented beta blocker date and time   Airway Mallampati: II  TM Distance: >3 FB Neck ROM: Full    Dental no notable dental hx. (+) Teeth Intact, Dental Advisory Given,    Pulmonary Current Smoker and Patient abstained from smoking.,  1 cigg/d   Pulmonary exam normal breath sounds clear to auscultation       Cardiovascular hypertension (153/80), Pt. on medications and Pt. on home beta blockers Normal cardiovascular exam Rhythm:Regular Rate:Normal     Neuro/Psych negative neurological ROS  negative psych ROS   GI/Hepatic negative GI ROS, Neg liver ROS,   Endo/Other  diabetes (pre-diabetic, a1c 5.9)Obesity BMI 36  Renal/GU Renal disease  negative genitourinary   Musculoskeletal negative musculoskeletal ROS (+)   Abdominal (+) + obese,   Peds  Hematology negative hematology ROS (+)   Anesthesia Other Findings L submandibular mass s/p radiation to Left face/mandible 2 months ago  Reproductive/Obstetrics negative OB ROS                            Anesthesia Physical Anesthesia Plan  ASA: 2  Anesthesia Plan: General   Post-op Pain Management:    Induction: Intravenous  PONV Risk Score and Plan: 2 and Ondansetron, Dexamethasone and Treatment may vary due to age or medical condition  Airway Management Planned: Oral ETT  Additional Equipment: None  Intra-op Plan:   Post-operative Plan: Extubation in OR  Informed Consent: I have reviewed the patients History and Physical, chart, labs and discussed the procedure including the risks, benefits and alternatives for the proposed anesthesia with the patient or authorized representative who has indicated his/her understanding and acceptance.      Dental advisory given  Plan Discussed with: CRNA  Anesthesia Plan Comments:        Anesthesia Quick Evaluation

## 2021-03-07 NOTE — Transfer of Care (Signed)
Immediate Anesthesia Transfer of Care Note  Patient: Jeffery Wilson  Procedure(s) Performed: LEFT PAROTIDECTOMY WITH FACIAL NERVE DISSECTION (Left: Face)  Patient Location: PACU  Anesthesia Type:General  Level of Consciousness: awake and oriented  Airway & Oxygen Therapy: Patient Spontanous Breathing  Post-op Assessment: Report given to RN  Post vital signs: Reviewed and stable  Last Vitals:  Vitals Value Taken Time  BP 134/73 03/07/21 1416  Temp    Pulse 80 03/07/21 1416  Resp 14 03/07/21 1416  SpO2 100 % 03/07/21 1416  Vitals shown include unvalidated device data.  Last Pain:  Vitals:   03/07/21 0910  TempSrc:   PainSc: 0-No pain         Complications: No notable events documented.

## 2021-03-07 NOTE — Interval H&P Note (Signed)
History and Physical Interval Note:  03/07/2021 10:59 AM  Jeffery Wilson  has presented today for surgery, with the diagnosis of Mass of left submandibular region.  The various methods of treatment have been discussed with the patient and family. After consideration of risks, benefits and other options for treatment, the patient has consented to  Procedure(s): LEFT PAROTIDECTOMY WITH FACIAL NERVE DISSECTION (Left) as a surgical intervention.  The patient's history has been reviewed, patient examined, no change in status, stable for surgery.  I have reviewed the patient's chart and labs.  Questions were answered to the patient's satisfaction.     [name]

## 2021-03-07 NOTE — Discharge Instructions (Signed)
Keep the surgical site clean and dry.  Change the dressing as needed.

## 2021-03-07 NOTE — Anesthesia Procedure Notes (Signed)
Procedure Name: Intubation Date/Time: 03/07/2021 12:47 PM Performed by: Barrington Ellison, CRNA Pre-anesthesia Checklist: Patient identified, Emergency Drugs available, Suction available and Patient being monitored Patient Re-evaluated:Patient Re-evaluated prior to induction Oxygen Delivery Method: Circle System Utilized Preoxygenation: Pre-oxygenation with 100% oxygen Induction Type: IV induction Ventilation: Mask ventilation without difficulty Laryngoscope Size: Mac and 4 Grade View: Grade I Tube type: Oral Tube size: 7.5 mm Number of attempts: 1 Airway Equipment and Method: Stylet and Oral airway Placement Confirmation: ETT inserted through vocal cords under direct vision, positive ETCO2 and breath sounds checked- equal and bilateral Secured at: 21 cm Tube secured with: Tape Dental Injury: Teeth and Oropharynx as per pre-operative assessment

## 2021-03-08 ENCOUNTER — Encounter (HOSPITAL_COMMUNITY): Payer: Self-pay | Admitting: Otolaryngology

## 2021-03-08 NOTE — Anesthesia Postprocedure Evaluation (Signed)
Anesthesia Post Note  Patient: Jeffery Wilson  Procedure(s) Performed: LEFT PAROTIDECTOMY WITH FACIAL NERVE DISSECTION (Left: Face)     Patient location during evaluation: PACU Anesthesia Type: General Level of consciousness: awake and alert, awake and oriented Pain management: pain level controlled Vital Signs Assessment: post-procedure vital signs reviewed and stable Respiratory status: spontaneous breathing, nonlabored ventilation and respiratory function stable Cardiovascular status: blood pressure returned to baseline and stable Postop Assessment: no apparent nausea or vomiting Anesthetic complications: no   No notable events documented.  Last Vitals:  Vitals:   03/07/21 1430 03/07/21 1445  BP: 133/77 130/75  Pulse: 77 73  Resp: 15 13  Temp:  36.5 C  SpO2: 99% 97%    Last Pain:  Vitals:   03/07/21 1445  TempSrc:   PainSc: 0-No pain                 Catalina Gravel

## 2021-03-12 LAB — SURGICAL PATHOLOGY

## 2021-03-14 LAB — SURGICAL PATHOLOGY

## 2021-03-19 ENCOUNTER — Other Ambulatory Visit: Payer: Self-pay

## 2021-03-19 DIAGNOSIS — C8211 Follicular lymphoma grade II, lymph nodes of head, face, and neck: Secondary | ICD-10-CM

## 2021-03-20 ENCOUNTER — Other Ambulatory Visit: Payer: Self-pay

## 2021-03-20 ENCOUNTER — Inpatient Hospital Stay: Payer: Medicare HMO | Attending: Hematology

## 2021-03-20 ENCOUNTER — Inpatient Hospital Stay: Payer: Medicare HMO | Admitting: Hematology

## 2021-03-20 VITALS — BP 140/80 | HR 67 | Temp 98.1°F | Resp 17 | Ht 71.0 in | Wt 254.4 lb

## 2021-03-20 DIAGNOSIS — Z923 Personal history of irradiation: Secondary | ICD-10-CM | POA: Diagnosis not present

## 2021-03-20 DIAGNOSIS — F17210 Nicotine dependence, cigarettes, uncomplicated: Secondary | ICD-10-CM | POA: Diagnosis not present

## 2021-03-20 DIAGNOSIS — Z9221 Personal history of antineoplastic chemotherapy: Secondary | ICD-10-CM | POA: Diagnosis not present

## 2021-03-20 DIAGNOSIS — C8211 Follicular lymphoma grade II, lymph nodes of head, face, and neck: Secondary | ICD-10-CM | POA: Insufficient documentation

## 2021-03-20 DIAGNOSIS — Z79899 Other long term (current) drug therapy: Secondary | ICD-10-CM | POA: Diagnosis not present

## 2021-03-20 LAB — CBC WITH DIFFERENTIAL (CANCER CENTER ONLY)
Abs Immature Granulocytes: 0.04 10*3/uL (ref 0.00–0.07)
Basophils Absolute: 0 10*3/uL (ref 0.0–0.1)
Basophils Relative: 0 %
Eosinophils Absolute: 0.2 10*3/uL (ref 0.0–0.5)
Eosinophils Relative: 2 %
HCT: 41.6 % (ref 39.0–52.0)
Hemoglobin: 14.5 g/dL (ref 13.0–17.0)
Immature Granulocytes: 0 %
Lymphocytes Relative: 8 %
Lymphs Abs: 0.8 10*3/uL (ref 0.7–4.0)
MCH: 33.5 pg (ref 26.0–34.0)
MCHC: 34.9 g/dL (ref 30.0–36.0)
MCV: 96.1 fL (ref 80.0–100.0)
Monocytes Absolute: 0.9 10*3/uL (ref 0.1–1.0)
Monocytes Relative: 9 %
Neutro Abs: 7.8 10*3/uL — ABNORMAL HIGH (ref 1.7–7.7)
Neutrophils Relative %: 81 %
Platelet Count: 209 10*3/uL (ref 150–400)
RBC: 4.33 MIL/uL (ref 4.22–5.81)
RDW: 13.3 % (ref 11.5–15.5)
WBC Count: 9.6 10*3/uL (ref 4.0–10.5)
nRBC: 0 % (ref 0.0–0.2)

## 2021-03-20 LAB — CMP (CANCER CENTER ONLY)
ALT: 14 U/L (ref 0–44)
AST: 16 U/L (ref 15–41)
Albumin: 4 g/dL (ref 3.5–5.0)
Alkaline Phosphatase: 64 U/L (ref 38–126)
Anion gap: 13 (ref 5–15)
BUN: 15 mg/dL (ref 8–23)
CO2: 25 mmol/L (ref 22–32)
Calcium: 9.8 mg/dL (ref 8.9–10.3)
Chloride: 102 mmol/L (ref 98–111)
Creatinine: 1.42 mg/dL — ABNORMAL HIGH (ref 0.61–1.24)
GFR, Estimated: 53 mL/min — ABNORMAL LOW (ref >60)
Glucose, Bld: 107 mg/dL — ABNORMAL HIGH (ref 70–99)
Potassium: 3.6 mmol/L (ref 3.5–5.1)
Sodium: 140 mmol/L (ref 135–145)
Total Bilirubin: 1.1 mg/dL (ref 0.3–1.2)
Total Protein: 7.4 g/dL (ref 6.5–8.1)

## 2021-03-20 LAB — LACTATE DEHYDROGENASE: LDH: 150 U/L (ref 98–192)

## 2021-03-22 ENCOUNTER — Telehealth: Payer: Self-pay

## 2021-03-22 MED ORDER — AMOXICILLIN-POT CLAVULANATE 875-125 MG PO TABS: 1.0000 | ORAL_TABLET | Freq: Two times a day (BID) | ORAL | 0 refills | Status: AC

## 2021-03-22 NOTE — Telephone Encounter (Signed)
Contacted pt's wife per her request. Wife states that pt has had a low grade fever 99.0 or 100.0 and post op incision site is slightly red. They have contacted surgeon but have not heard back yet. Per Dr [name] at pt's appointment this week, pt was to call if he felt site was getting infected. Abx called in for pt but Dr [name] also wanted pt to have surgeon look at site. Wife verbalized understanding.

## 2021-03-26 NOTE — Progress Notes (Signed)
HEMATOLOGY/ONCOLOGY CLINIC NOTE  Date of Service: .03/20/2021   Patient Care Team: [name], MD as PCP - General (Family Medicine) Malmfelt, [name], RN as Oncology Nurse Navigator [name], Cloria Spring, MD as Consulting Physician (Hematology) [name], MD as Consulting Physician (Radiation Oncology)  CHIEF COMPLAINTS/PURPOSE OF CONSULTATION:  F/u for [place]  HISTORY OF PRESENTING ILLNESS:   Jeffery Wilson is a wonderful 70 y.o. male who has been referred to [place] by Dr. Helane Rima for evaluation and management of his concern for a Lymphoproliferative process. He is accompanied today by his daughter and wife. The pt reports that he is doing well overall.   The pt reports that he first noticed the left jaw mass two years ago which has slowly grown. 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 The pt notes that his PCP Dr. Lavone Neri noticed the mass in July/August at a wellness visit and he was subsequently referred to ENT Dr. Fenton Malling at [facility]. The pt denies pain, discomfort, fevers, chills, night sweats, unexpected weight loss, and any other lumps or bumps. He notes that he is not feeling any differently recently as compared to the last 6 months to a year.   The pt also notes that his right lower leg is also larger than his left, and has been this way all of his life. He notes that his ankle swelling is stable and has varicose veins.   The pt notes hammer toe surgery and arthroscopic left knee surgery. He notes that he was previously diagnosed with CKD but has managed this well and notes his creatinine has nearly normalized. He also notes that has hypertension.   The pt notes that he had Prevnar and Pneumovax last year and regularly receives his annual flu vaccine.   Of note prior to the patient's visit today, pt has had a Submandibular gland  biopsy completed on 01/28/18 with results revealing concern for a lymphoproliferative process.   The pt also had a 01/15/18 CT Neck which revealed 3.3 x 4.1 x 5 cm solid LEFT submandibular gland mass. Differential diagnosis includes benign mixed tumor or carcinoma. 2. Prominent though not pathologically enlarged LEFT lymph nodes potentially metastatic if tumor is malignant. 3. For above findings, recommend ENT consultation and histopathologic correlation.  Most recent lab results (01/28/18) of CBC is as follows: all values are WNL.  On review of systems, pt reports left jaw mass, stable energy levels, moving his bowels well, stable ankle swelling, and denies fevers, chills, night sweats, painful jaw mass, discomfort at jaw mass, unexpected weight loss, problems swallowing, pain along the spine, itching, back pain, new fatigue, abdominal pains, problems passing urine, and any other symptoms.   On PMHx the pt reports CKD, HTN, left knee arthroscopic knee surgery in 2016. 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 The pt smokes one cigarette every morning. He drinks 2-3 drinks of whiskey most afternoons, 4-6 on the weekends.  On Family Hx the pt reports father with MI at age 32. Denies blood disorders or cancer.   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 The patient's last visit with [place] was on 11/14/2020. The pt reports that he is doing well overall. We are joined today by his wife.  The pt reports he is left neck mass from his low-grade follicular lymphoma initially shrank after radiation therapy and then started swelling again.  The patient was referred to ENT for a biopsy of his residual left neck mass which continues to show low-grade follicular lymphoma with no evidence of transformation.  Last imaging study with CT of the soft tissue of the neck on 02/13/2021 prior to the biopsy showed Residual left  submandibular lymphoma with new infiltration into the left parotid tail. The mass is smaller than in 2019 and similar to Jan 2022 ( 6.6 x 3.2 x 5 cm).  Labs done today 03/20/2021 showed normal CBC, stable CMP and normal LDH level of 150.  His repeat biopsy results from 03/07/2021 were discussed with him in details- PAROTID GLAND, LEFT MASS, PAROTIDECTOMY:  - Follicular lymphoma, grade 1-2 of 3.   On review of systems, pt reports some discomfort over the surgical site with some mild redness. He subsequently called a couple of days back to note increasing pain and redness over the area.  He was asked to call ENT to evaluate for postoperative infection.  We also empirically started him on antibiotics.  MEDICAL HISTORY:  Past Medical History:  Diagnosis Date   Cancer ([place])    Hypertension    Lymphoma ([place])    Pre-diabetes    Renal disorder    pt says stage 3 kidney dz     SURGICAL HISTORY: Past Surgical History:  Procedure Laterality Date   HAMMER TOE SURGERY     KNEE SURGERY     PAROTIDECTOMY Left 03/07/2021   Procedure: LEFT PAROTIDECTOMY WITH FACIAL NERVE DISSECTION;  Surgeon: [name], MD;  Location: [facility] OR;  Service: ENT;  Laterality: Left;    SOCIAL HISTORY: Social History   Socioeconomic History   Marital status: Married    Spouse name: Not on file   Number of children: Not on file   Years of education: Not on file   Highest education level: Not on file  Occupational History   Not on file  Tobacco Use   Smoking status: Every Day    Packs/day: 0.15    Types: Cigarettes   Smokeless tobacco: Never   Tobacco comments:    1 cigarette per day  Vaping Use   Vaping Use: Never used  Substance and Sexual Activity   Alcohol use: Not Currently    Comment: 2-3/day "hard whiskey"   Drug use: No   Sexual activity: Not on file  Other Topics Concern   Not on file  Social History Narrative   Not on file   Social Determinants of Health   Financial Resource Strain: Not on  file  Food Insecurity: Not on file  Transportation Needs: Not on file  Physical Activity: Not on file  Stress: Not on file  Social Connections: Not on file  Intimate Partner Violence: Not on file    FAMILY HISTORY: No family history on file.  ALLERGIES:  has No Known Allergies.  MEDICATIONS:  Current Outpatient Medications  Medication Sig Dispense Refill   amoxicillin-clavulanate (AUGMENTIN) 875-125 MG tablet Take 1 tablet by mouth 2 (two) times daily for 10 days. 20 tablet 0   Coal Tar Extract [phone] PSORIASIS MEDICATED EX) Apply 1 application topically daily.     [employer] Omega-3 Krill Oil 350 MG CAPS Take 350 mg by mouth daily.     Echinacea 650 MG CAPS Take 650 mg by mouth daily.     furosemide (LASIX) 40 MG tablet Take  40 mg by mouth daily.      HYDROcodone-acetaminophen (NORCO) 7.5-325 MG tablet Take 1 tablet by mouth every 6 (six) hours as needed for moderate pain. 20 tablet 0   hydroxypropyl methylcellulose / hypromellose (ISOPTO TEARS / GONIOVISC) 2.5 % ophthalmic solution Place 1 drop into both eyes 3 (three) times daily as needed for dry eyes.     metoprolol succinate (TOPROL-XL) 50 MG 24 hr tablet Take 50 mg by mouth daily. Take with or immediately following a meal.     Milk Thistle 1000 MG CAPS Take 1,000 mg by mouth daily.     Multiple Vitamins-Minerals (MULTIVITAMIN PO) Take 1 tablet by mouth daily.     pravastatin (PRAVACHOL) 20 MG tablet Take 20 mg by mouth at bedtime.      Turmeric 500 MG CAPS Take 500 mg by mouth daily.     acetaminophen (TYLENOL) 500 MG tablet Take 1,000 mg by mouth at bedtime. (Patient not taking: Reported on 03/20/2021)     ondansetron (ZOFRAN ODT) 8 MG disintegrating tablet Take 1 tablet (8 mg total) by mouth every 8 (eight) hours as needed for nausea or vomiting. (Patient not taking: Reported on 03/20/2021) 20 tablet 0   traMADol (ULTRAM) 50 MG tablet Take 1 tablet (50 mg total) by mouth every 6 (six) hours as needed. (Patient not taking: Reported on  03/20/2021) 10 tablet 0   vitamin C (ASCORBIC ACID) 500 MG tablet Take 500 mg by mouth daily. (Patient not taking: Reported on 03/20/2021)     No current facility-administered medications for this visit.    REVIEW OF SYSTEMS:   .10 Point review of Systems was done is negative except as noted above.  PHYSICAL EXAMINATION: ECOG PERFORMANCE STATUS: 1 - Symptomatic but completely ambulatory  Vitals:   03/20/21 1006  BP: 140/80  Pulse: 67  Resp: 17  Temp: 98.1 F (36.7 C)  SpO2: 99%   Filed Weights   03/20/21 1006  Weight: 254 lb 6.4 oz (115.4 kg)   .Body mass index is 35.48 kg/m.   Marland Kitchen GENERAL:alert, in no acute distress and comfortable SKIN: no acute rashes, no significant lesions EYES: conjunctiva are pink and non-injected, sclera anicteric OROPHARYNX: MMM, no exudates, no oropharyngeal erythema or ulceration NECK: supple, no JVD LYMPH:  no palpable lymphadenopathy in the axillary or inguinal regions.  Left submandibular flat mass with decreased fullness after resection of the part of the mass infiltrating the left parotid gland. LUNGS: clear to auscultation b/l with normal respiratory effort HEART: regular rate & rhythm ABDOMEN:  normoactive bowel sounds , non tender, not distended. Extremity: no pedal edema PSYCH: alert & oriented x 3 with fluent speech NEURO: no focal motor/sensory deficits   LABORATORY DATA:  I have reviewed the data as listed  . CBC Latest Ref Rng & Units 03/20/2021 02/13/2021 11/14/2020  WBC 4.0 - 10.5 K/uL 9.6 4.5 6.7  Hemoglobin 13.0 - 17.0 g/dL 14.5 14.9 15.1  Hematocrit 39.0 - 52.0 % 41.6 43.6 45.2  Platelets 150 - 400 K/uL 209 160 212    . CMP Latest Ref Rng & Units 03/20/2021 02/13/2021 11/14/2020  Glucose 70 - 99 mg/dL 107(H) 91 105(H)  BUN 8 - 23 mg/dL 15 18 19   Creatinine 0.61 - 1.24 mg/dL 1.42(H) 1.45(H) 1.43(H)  Sodium 135 - 145 mmol/L 140 140 141  Potassium 3.5 - 5.1 mmol/L 3.6 3.7 4.3  Chloride 98 - 111 mmol/L 102 102 102  CO2 22 -  32 mmol/L 25 27 29   Calcium  8.9 - 10.3 mg/dL 9.8 9.7 9.9  Total Protein 6.5 - 8.1 g/dL 7.4 7.3 7.4  Total Bilirubin 0.3 - 1.2 mg/dL 1.1 0.6 0.5  Alkaline Phos 38 - 126 U/L 64 61 62  AST 15 - 41 U/L 16 21 23   ALT 0 - 44 U/L 14 21 19    Component     Latest Ref Rng & Units 03/24/2018  Hepatitis B Surface Ag     Negative Negative  Hep B Core Ab, Tot     Negative Negative  HIV Screen 4th Generation wRfx     Non Reactive Non Reactive  HCV Ab     0.0 - 0.9 s/co ratio <0.1  LDH     98 - 192 U/L 139   . Lab Results  Component Value Date   LDH 150 03/20/2021    01/28/18 Submandibular gland biopsy:    RADIOGRAPHIC STUDIES: I have personally reviewed the radiological images as listed and agreed with the findings in the report. No results found.  ASSESSMENT & PLAN:   70 y.o. male with  1. 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 Additional nonenlarged lymph nodes in the left neck shows low level FDG accumulation, suspicious for tumor involvement. 2. Tiny right groin lymph node shows low level FDG uptake, indeterminate. Otherwise, no evidence for hypermetabolic disease in the chest, abdomen, or pelvis. 3. Hepatic steatosis. 4. Cholelithiasis. 5. Colonic diverticulosis without diverticulitis.   04/24/18 Left cervical lymph node incisional biopsy revealed Follicular lymphoma, Grade 1-2 with a 10% proliferation rate   S/p 4 weekly cycles of Rituxan (limited rate) completed on 06/09/18  08/04/18 PET/CT revealed Response to therapy, as evidenced by mildly decreased size and  hypermetabolism within a dominant left submandibular mass. The previously described left-sided cervical nodal hypermetabolism has resolved. 2. Decrease in right inguinal nodal hypermetabolism, favored to be physiologic or reactive. 3. No new or progressive disease. 4. Incidental findings, including cholelithiasis, left adrenal adenoma.  PLAN: -Discussed pt labwork today, 03/20/2021; counts normal, chemistries stable. LDH normal. -Last imaging study with CT of the soft tissue of the neck on 02/13/2021 prior to the biopsy showed Residual left submandibular lymphoma with new infiltration into the left parotid tail. The mass is smaller than in 2019 and similar to Jan 2022 ( 6.6 x 3.2 x 5 cm).  Labs done today 03/20/2021 showed normal CBC, stable CMP and normal LDH level of 150.  His repeat biopsy results from 03/07/2021 were discussed with him in details- PAROTID GLAND, LEFT MASS, PAROTIDECTOMY:  - Follicular lymphoma, grade 1-2 of 3.   -At this time the patient's left neck mass has flattened out and is not bothering him at this time.  No other new clinically evident progression of lymphoma. -Labs are stable with no significant cytopenias at the patient has no constitutional symptoms. -No indication for subsequent treatment of the patient's follicular lymphoma at this time. -If he does have symptomatic progression we will consider treatment with Revlimid Rituxan in the future. -No evidence of histologic transformation to large cell lymphoma. -Patient having some redness and some  discomfort over the surgical site probably a combination of surgery and radiation therapy.  He subsequently called with increasing pain and discomfort and was prescribed Augmentin for possible surgical site infection.  He was also recommended to see his ear nose throat doctor soon as possible for local reassessment.  FOLLOW UP: Return to clinic with Dr. [name] with labs in 10 weeks . The total time spent in the appointment was 30  minutes and more than 50% was on counseling and direct patient cares.  All of the patient's questions were answered with apparent satisfaction. The patient knows to call the clinic with any problems, questions or concerns.    Sullivan Lone MD [place] [facility]IV[facility] Hematology/Oncology [facility]

## 2021-04-04 ENCOUNTER — Encounter (HOSPITAL_COMMUNITY): Payer: Medicare HMO | Admitting: Dentistry

## 2021-05-02 ENCOUNTER — Other Ambulatory Visit: Payer: Self-pay

## 2021-05-02 ENCOUNTER — Encounter (HOSPITAL_COMMUNITY): Payer: Self-pay | Admitting: Dentistry

## 2021-05-02 ENCOUNTER — Ambulatory Visit (INDEPENDENT_AMBULATORY_CARE_PROVIDER_SITE_OTHER): Payer: Self-pay | Admitting: Dentistry

## 2021-05-02 VITALS — BP 144/75 | HR 52 | Temp 98.9°F

## 2021-05-02 DIAGNOSIS — K029 Dental caries, unspecified: Secondary | ICD-10-CM

## 2021-05-02 DIAGNOSIS — K032 Erosion of teeth: Secondary | ICD-10-CM

## 2021-05-02 NOTE — Progress Notes (Signed)
Department of Dental Medicine               RESTORATIVE VISIT   Service Date:   05/02/2021  Patient Name:   Jeffery Wilson Date of Birth:   12/16/1950 Medical Record Number: [id number]   TODAY'S VISIT:   Diagnosis Teeth #'s 18 and 21 caries, tooth #28 abfraction Procedures: Restorative on #18, #21, #28 Plan: Next visit:  Continue w/ restorative per treatment plan   05/02/2021 Progress Note:  COVID-19 SCREENING:  The patient denies symptoms concerning for COVID-19 infection including fever, chills, cough, or newly developed shortness of breath.   HISTORY OF PRESENT ILLNESS: Jeffery Wilson is a 70 y.o. male who presents today for restorative treatment on teeth numbers 18, 21 and 28 per treatment plan.   CHIEF COMPLAINT:  Here for a routine dental appointment.  Patient with no complaints.   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)    Hypertension    Lymphoma ([place])    Pre-diabetes    Renal disorder    pt says stage 3 kidney dz    Past Surgical History:  Procedure Laterality Date   HAMMER TOE SURGERY     KNEE SURGERY     PAROTIDECTOMY Left 03/07/2021   Procedure: LEFT PAROTIDECTOMY WITH FACIAL NERVE DISSECTION;  Surgeon: [name], MD;  Location: [facility];  Service: ENT;  Laterality: Left;   No Known Allergies Current Outpatient Medications  Medication Sig Dispense Refill   acetaminophen (TYLENOL) 500 MG tablet Take 1,000 mg by mouth at bedtime. (Patient not taking: Reported on 03/20/2021)     Coal Tar Extract [phone] PSORIASIS MEDICATED EX) Apply 1 application topically daily.     [employer] Omega-3 Krill Oil 350 MG CAPS Take 350 mg by mouth daily.     Echinacea 650 MG CAPS Take 650 mg by mouth daily.     furosemide (LASIX) 40 MG tablet Take 40 mg by mouth  daily.      HYDROcodone-acetaminophen (NORCO) 7.5-325 MG tablet Take 1 tablet by mouth every 6 (six) hours as needed for moderate pain. 20 tablet 0   hydroxypropyl methylcellulose / hypromellose (ISOPTO TEARS / GONIOVISC) 2.5 % ophthalmic solution Place 1 drop into both eyes 3 (three) times daily as needed for dry eyes.     metoprolol succinate (TOPROL-XL) 50 MG 24 hr tablet Take 50 mg by mouth daily. Take with or immediately following a meal.     Milk Thistle 1000 MG CAPS Take 1,000 mg by mouth daily.     Multiple Vitamins-Minerals (MULTIVITAMIN PO) Take 1 tablet by mouth daily.     ondansetron (ZOFRAN ODT) 8 MG disintegrating tablet Take 1 tablet (8 mg total) by mouth every 8 (eight) hours as needed for nausea or vomiting. (Patient not taking: Reported on 03/20/2021) 20 tablet 0   pravastatin (PRAVACHOL) 20 MG tablet Take 20 mg by mouth at bedtime.      traMADol (ULTRAM) 50 MG tablet Take 1 tablet (50 mg total) by mouth every 6 (six) hours as needed. (Patient not taking: Reported on 03/20/2021) 10 tablet 0   Turmeric 500 MG CAPS Take 500 mg by mouth daily.     vitamin C (ASCORBIC ACID) 500 MG tablet Take 500  mg by mouth daily. (Patient not taking: Reported on 03/20/2021)     No current facility-administered medications for this visit.    LABS: Lab Results  Component Value Date   WBC 9.6 03/20/2021   HGB 14.5 03/20/2021   HCT 41.6 03/20/2021   MCV 96.1 03/20/2021   PLT 209 03/20/2021      Component Value Date/Time   NA 140 03/20/2021 0945   K 3.6 03/20/2021 0945   CL 102 03/20/2021 0945   CO2 25 03/20/2021 0945   GLUCOSE 107 (H) 03/20/2021 0945   BUN 15 03/20/2021 0945   CREATININE 1.42 (H) 03/20/2021 0945   CALCIUM 9.8 03/20/2021 0945   GFRNONAA 53 (L) 03/20/2021 0945   GFRAA 57 (L) 03/06/2020 0919   Lab Results  Component Value Date   INR 1.05 01/28/2018   No results found for: PTT  Social History   Socioeconomic History   Marital status: Married    Spouse name: Not on  file   Number of children: Not on file   Years of education: Not on file   Highest education level: Not on file  Occupational History   Not on file  Tobacco Use   Smoking status: Every Day    Packs/day: 0.15    Types: Cigarettes   Smokeless tobacco: Never   Tobacco comments:    1 cigarette per day  Vaping Use   Vaping Use: Never used  Substance and Sexual Activity   Alcohol use: Not Currently    Comment: 2-3/day "hard whiskey"   Drug use: No   Sexual activity: Not on file  Other Topics Concern   Not on file  Social History Narrative   Not on file   Social Determinants of Health   Financial Resource Strain: Not on file  Food Insecurity: Not on file  Transportation Needs: Not on file  Physical Activity: Not on file  Stress: Not on file  Social Connections: Not on file  Intimate Partner Violence: Not on file   No family history on file.   ANTIBIOTIC PROPHYLAXIS/OTHER PREMEDICATION: None indicated.   VITAL SIGNS: BP (!) 144/75 (BP Location: Right Arm, Patient Position: Sitting, Cuff Size: Normal)   Pulse (!) 52   Temp 98.9 F (37.2 C) (Oral)     ASSESSMENT/INDICATION(S): #18 and #21 caries, #28 abfraction   PROCEDURES: Restorative treatment on teeth #'s 18O, 21B(V), 28B(V). Anesthesia: Topical:  Benzocaine 20% applied Type of anesthesia used:  68 mg lidocaine, 0.036 mg epinephrine Location:  #18, #21, #28 infiltration Aspiration negative. Composite restorations: Cotton roll isolation. Excavated decay from teeth numbers 18 & 21. Teeth numbers 18O, 21B(V) & 28B(V) were prepared for composite. The extent of caries was into dentin on #18 & #21. Placed Gluma on pulpal floor of teeth #21 & #28. Etched enamel and dentin surfaces  with 32% phosphoric acid for 15 seconds and rinsed thoroughly. Removed excess water with a brief burst of air. Optibond bonding agent placed and air dried until no movement of bonding agent was seen and then light cured for 10 seconds.  OMNICHROMA flowable composite material placed in increments and light-cured. Removed excess material with carbide finishing bur. Occlusion and margins were verified and adjusted as needed.  Restorations were finished and polished. The patient was advised of possible normal sensitivity to hot and cold for the next few days/weeks.   PLAN:  Next visit:  Continue restorative per treatment plan, localized SRP (discuss @ next visit)  All questions and concerns were invited and  addressed.  The patient tolerated today's visit well and departed in stable condition.     [place] Benson [place], D.M.D.

## 2021-05-08 DIAGNOSIS — K032 Erosion of teeth: Secondary | ICD-10-CM | POA: Insufficient documentation

## 2021-05-08 DIAGNOSIS — K029 Dental caries, unspecified: Secondary | ICD-10-CM | POA: Insufficient documentation

## 2021-05-23 ENCOUNTER — Encounter (HOSPITAL_COMMUNITY): Payer: Medicare HMO | Admitting: Dentistry

## 2021-05-29 ENCOUNTER — Inpatient Hospital Stay: Payer: Medicare HMO | Attending: Hematology

## 2021-05-29 ENCOUNTER — Inpatient Hospital Stay: Payer: Medicare HMO | Admitting: Hematology

## 2021-05-29 ENCOUNTER — Other Ambulatory Visit: Payer: Self-pay

## 2021-05-29 VITALS — BP 142/84 | HR 62 | Temp 97.9°F | Resp 17 | Ht 71.0 in | Wt 255.3 lb

## 2021-05-29 DIAGNOSIS — Z9221 Personal history of antineoplastic chemotherapy: Secondary | ICD-10-CM | POA: Diagnosis not present

## 2021-05-29 DIAGNOSIS — Z923 Personal history of irradiation: Secondary | ICD-10-CM | POA: Diagnosis not present

## 2021-05-29 DIAGNOSIS — C8211 Follicular lymphoma grade II, lymph nodes of head, face, and neck: Secondary | ICD-10-CM | POA: Diagnosis not present

## 2021-05-29 DIAGNOSIS — Z8572 Personal history of non-Hodgkin lymphomas: Secondary | ICD-10-CM | POA: Diagnosis not present

## 2021-05-29 LAB — CMP (CANCER CENTER ONLY)
ALT: 15 U/L (ref 0–44)
AST: 21 U/L (ref 15–41)
Albumin: 4.3 g/dL (ref 3.5–5.0)
Alkaline Phosphatase: 73 U/L (ref 38–126)
Anion gap: 12 (ref 5–15)
BUN: 22 mg/dL (ref 8–23)
CO2: 24 mmol/L (ref 22–32)
Calcium: 9.3 mg/dL (ref 8.9–10.3)
Chloride: 104 mmol/L (ref 98–111)
Creatinine: 1.6 mg/dL — ABNORMAL HIGH (ref 0.61–1.24)
GFR, Estimated: 46 mL/min — ABNORMAL LOW (ref >60)
Glucose, Bld: 87 mg/dL (ref 70–99)
Potassium: 3.8 mmol/L (ref 3.5–5.1)
Sodium: 140 mmol/L (ref 135–145)
Total Bilirubin: 0.8 mg/dL (ref 0.3–1.2)
Total Protein: 7.5 g/dL (ref 6.5–8.1)

## 2021-05-29 LAB — CBC WITH DIFFERENTIAL (CANCER CENTER ONLY)
Abs Immature Granulocytes: 0.02 10*3/uL (ref 0.00–0.07)
Basophils Absolute: 0 10*3/uL (ref 0.0–0.1)
Basophils Relative: 1 %
Eosinophils Absolute: 0.1 10*3/uL (ref 0.0–0.5)
Eosinophils Relative: 2 %
HCT: 45.4 % (ref 39.0–52.0)
Hemoglobin: 15.4 g/dL (ref 13.0–17.0)
Immature Granulocytes: 1 %
Lymphocytes Relative: 22 %
Lymphs Abs: 0.9 10*3/uL (ref 0.7–4.0)
MCH: 33.1 pg (ref 26.0–34.0)
MCHC: 33.9 g/dL (ref 30.0–36.0)
MCV: 97.6 fL (ref 80.0–100.0)
Monocytes Absolute: 0.6 10*3/uL (ref 0.1–1.0)
Monocytes Relative: 14 %
Neutro Abs: 2.6 10*3/uL (ref 1.7–7.7)
Neutrophils Relative %: 60 %
Platelet Count: 180 10*3/uL (ref 150–400)
RBC: 4.65 MIL/uL (ref 4.22–5.81)
RDW: 13.5 % (ref 11.5–15.5)
WBC Count: 4.2 10*3/uL (ref 4.0–10.5)
nRBC: 0 % (ref 0.0–0.2)

## 2021-05-29 LAB — LACTATE DEHYDROGENASE: LDH: 153 U/L (ref 98–192)

## 2021-05-29 NOTE — Progress Notes (Addendum)
HEMATOLOGY/ONCOLOGY CLINIC NOTE  Date of Service: .05/29/2021   Patient Care Team: [name], MD as PCP - General (Family Medicine) Mal[place]elt, [name], RN as Oncology Nurse Navigator [name], Cloria Spring, MD as Consulting Physician (Hematology) [name], MD as Consulting Physician (Radiation Oncology)  CHIEF COMPLAINTS/PURPOSE OF CONSULTATION:  F/u for follicular lymphoma  HISTORY OF PRESENTING ILLNESS:  See previous note for detail  Interval History:  Jeffery Wilson is here for his scheduled follow-up for evaluation and continued management of his follicular lymphoma. His last clinic visit with [place] was about 2 months ago on 03/20/2021. At that time he had completed radiation to his left jaw/upper neck mass and was still having swelling resulting in a biopsy of the mass by Dr. [name] which confirmed follicular lymphoma with no histological transformation to a large B-cell lymphoma. Patient was noted to have increased redness and swelling and was prescribed Augmentin. Patient notes that after taking her his antibiotics he is postoperative swelling drained a purulent discharge and all the swelling significantly resolved. Was following with Dr. [name] from an ENT standpoint.  Patient notes since that happened his left neck swelling has shrunk significantly and flattened out and is not very visible at this time. He notes no neck pain or swelling.  No difficulty with opening his mouth or chewing. No fevers no chills no night sweats. No other acute new focal symptoms.  Labs done today 05/29/2021 show normal CBC, stable CMP with chronic kidney disease creatinine 1.6, LDH within normal limits at 153. Patient has otherwise been feeling well and has stayed quite physically active. He is accompanied with his wife and both of them are grateful that he is doing okay at this time.   MEDICAL HISTORY:  Past Medical History:  Diagnosis Date   Cancer ([place])    Hypertension     Lymphoma ([place])    Pre-diabetes    Renal disorder    pt says stage 3 kidney dz     SURGICAL HISTORY: Past Surgical History:  Procedure Laterality Date   HAMMER TOE SURGERY     KNEE SURGERY     PAROTIDECTOMY Left 03/07/2021   Procedure: LEFT PAROTIDECTOMY WITH FACIAL NERVE DISSECTION;  Surgeon: [name], MD;  Location: [facility] OR;  Service: ENT;  Laterality: Left;    SOCIAL HISTORY: Social History   Socioeconomic History   Marital status: Married    Spouse name: Not on file   Number of children: Not on file   Years of education: Not on file   Highest education level: Not on file  Occupational History   Not on file  Tobacco Use   Smoking status: Every Day    Packs/day: 0.15    Types: Cigarettes   Smokeless tobacco: Never   Tobacco comments:    1 cigarette per day  Vaping Use   Vaping Use: Never used  Substance and Sexual Activity   Alcohol use: Not Currently    Comment: 2-3/day "hard whiskey"   Drug use: No   Sexual activity: Not on file  Other Topics Concern   Not on file  Social History Narrative   Not on file   Social Determinants of Health   Financial Resource Strain: Not on file  Food Insecurity: Not on file  Transportation Needs: Not on file  Physical Activity: Not on file  Stress: Not on file  Social Connections: Not on file  Intimate Partner Violence: Not on file    FAMILY HISTORY: No family  history on file.  ALLERGIES:  has No Known Allergies.  MEDICATIONS:  Current Outpatient Medications  Medication Sig Dispense Refill   acetaminophen (TYLENOL) 500 MG tablet Take 1,000 mg by mouth at bedtime. (Patient not taking: Reported on 03/20/2021)     Coal Tar Extract [phone] PSORIASIS MEDICATED EX) Apply 1 application topically daily.     CVS Omega-3 Krill Oil 350 MG CAPS Take 350 mg by mouth daily.     Echinacea 650 MG CAPS Take 650 mg by mouth daily.     furosemide (LASIX) 40 MG tablet Take 40 mg by mouth daily.      HYDROcodone-acetaminophen (NORCO)  7.5-325 MG tablet Take 1 tablet by mouth every 6 (six) hours as needed for moderate pain. 20 tablet 0   hydroxypropyl methylcellulose / hypromellose (ISOPTO TEARS / GONIOVISC) 2.5 % ophthalmic solution Place 1 drop into both eyes 3 (three) times daily as needed for dry eyes.     metoprolol succinate (TOPROL-XL) 50 MG 24 hr tablet Take 50 mg by mouth daily. Take with or immediately following a meal.     Milk Thistle 1000 MG CAPS Take 1,000 mg by mouth daily.     Multiple Vitamins-Minerals (MULTIVITAMIN PO) Take 1 tablet by mouth daily.     ondansetron (ZOFRAN ODT) 8 MG disintegrating tablet Take 1 tablet (8 mg total) by mouth every 8 (eight) hours as needed for nausea or vomiting. (Patient not taking: Reported on 03/20/2021) 20 tablet 0   pravastatin (PRAVACHOL) 20 MG tablet Take 20 mg by mouth at bedtime.      traMADol (ULTRAM) 50 MG tablet Take 1 tablet (50 mg total) by mouth every 6 (six) hours as needed. (Patient not taking: Reported on 03/20/2021) 10 tablet 0   Turmeric 500 MG CAPS Take 500 mg by mouth daily.     vitamin C (ASCORBIC ACID) 500 MG tablet Take 500 mg by mouth daily. (Patient not taking: Reported on 03/20/2021)     No current facility-administered medications for this visit.    REVIEW OF SYSTEMS:   .10 Point review of Systems was done is negative except as noted above.   PHYSICAL EXAMINATION: ECOG PERFORMANCE STATUS: 1 - Symptomatic but completely ambulatory  Vitals:   05/29/21 0948  BP: (!) 142/84  Pulse: 62  Resp: 17  Temp: 97.9 F (36.6 C)  SpO2: 99%   Filed Weights   05/29/21 0948  Weight: 255 lb 4.8 oz (115.8 kg)   .Body mass index is 35.61 kg/m.  Marland Kitchen GENERAL:alert, in no acute distress and comfortable SKIN: no acute rashes, no significant lesions EYES: conjunctiva are pink and non-injected, sclera anicteric OROPHARYNX: MMM, no exudates, no oropharyngeal erythema or ulceration NECK: supple, no JVD, left submandibular/angle of the jaw mass has flattened out  and significantly improved and does not note much noticeable at this time. LYMPH:  no palpable lymphadenopathy in the cervical, axillary or inguinal regions LUNGS: clear to auscultation b/l with normal respiratory effort HEART: regular rate & rhythm ABDOMEN:  normoactive bowel sounds , non tender, not distended. Extremity: no pedal edema PSYCH: alert & oriented x 3 with fluent speech NEURO: no focal motor/sensory deficits     LABORATORY DATA:  I have reviewed the data as listed  . CBC Latest Ref Rng & Units 05/29/2021 03/20/2021 02/13/2021  WBC 4.0 - 10.5 K/uL 4.2 9.6 4.5  Hemoglobin 13.0 - 17.0 g/dL 15.4 14.5 14.9  Hematocrit 39.0 - 52.0 % 45.4 41.6 43.6  Platelets 150 - 400 K/uL 180  209 160    . CMP Latest Ref Rng & Units 05/29/2021 03/20/2021 02/13/2021  Glucose 70 - 99 mg/dL 87 107(H) 91  BUN 8 - 23 mg/dL 22 15 18   Creatinine 0.61 - 1.24 mg/dL 1.60(H) 1.42(H) 1.45(H)  Sodium 135 - 145 mmol/L 140 140 140  Potassium 3.5 - 5.1 mmol/L 3.8 3.6 3.7  Chloride 98 - 111 mmol/L 104 102 102  CO2 22 - 32 mmol/L 24 25 27   Calcium 8.9 - 10.3 mg/dL 9.3 9.8 9.7  Total Protein 6.5 - 8.1 g/dL 7.5 7.4 7.3  Total Bilirubin 0.3 - 1.2 mg/dL 0.8 1.1 0.6  Alkaline Phos 38 - 126 U/L 73 64 61  AST 15 - 41 U/L 21 16 21   ALT 0 - 44 U/L 15 14 21    Component     Latest Ref Rng & Units 03/24/2018  Hepatitis B Surface Ag     Negative Negative  Hep B Core Ab, Tot     Negative Negative  HIV Screen 4th Generation wRfx     Non Reactive Non Reactive  HCV Ab     0.0 - 0.9 s/co ratio <0.1  LDH     98 - 192 U/L 139   . Lab Results  Component Value Date   LDH 153 05/29/2021    01/28/18 Submandibular gland biopsy:    RADIOGRAPHIC STUDIES: I have personally reviewed the radiological images as listed and agreed with the findings in the report. No results found.  ASSESSMENT & PLAN:   70 y.o. male with  1. 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 Additional nonenlarged lymph nodes in the left neck shows low level FDG accumulation, suspicious for tumor involvement. 2. Tiny right groin lymph node shows low level FDG uptake, indeterminate. Otherwise, no evidence for hypermetabolic disease in the chest, abdomen, or pelvis. 3. Hepatic steatosis. 4. Cholelithiasis. 5. Colonic diverticulosis without diverticulitis.   04/24/18 Left cervical lymph node incisional biopsy revealed Follicular lymphoma, Grade 1-2 with a 10% proliferation rate   S/p 4 weekly cycles of Rituxan (limited rate) completed on 06/09/18  08/04/18 PET/CT revealed Response to therapy, as evidenced by mildly decreased size and hypermetabolism within a dominant left submandibular mass. The previously described left-sided cervical nodal hypermetabolism has resolved. 2. Decrease in right inguinal nodal hypermetabolism, favored to be physiologic or reactive. 3. No new or progressive disease. 4. Incidental findings, including cholelithiasis, left adrenal adenoma.  PLAN: -Patients labs done today 05/29/2021 were discussed in details and show normal CBC, stable CMP and normal LDH levels. -He had swelling from a postoperative infection of his left submandibular lymphadenopathy site which has resolved with antibiotics and since this area has significantly shrunk in size and lymphadenopathy appears to have nearly completely resolved.  No significant visible or palpable adenopathy at this time. -Patient has no other clinical symptoms or lab findings of lymphoma recurrence or progression at this time. -No indication for  additional treatment of his follicular lymphoma at this time. -He was recommended to follow-up with his dentist for continued optimal dental cares.  FOLLOW UP: Return  to clinic with Dr. [name] with labs in 6 months   All of the patient's questions were answered with apparent satisfaction. The patient knows to call the clinic with any problems, questions or concerns.    Sullivan Lone MD [place] A[facility]IVMS [facility] Hematology/Oncology Physician [facility]

## 2021-06-05 NOTE — Addendum Note (Signed)
Addended by: Sullivan Lone on: 06/05/2021 08:30 AM   Modules accepted: Orders

## 2021-07-11 ENCOUNTER — Ambulatory Visit (INDEPENDENT_AMBULATORY_CARE_PROVIDER_SITE_OTHER): Payer: Self-pay | Admitting: Dentistry

## 2021-07-11 ENCOUNTER — Other Ambulatory Visit: Payer: Self-pay

## 2021-07-11 ENCOUNTER — Encounter (HOSPITAL_COMMUNITY): Payer: Self-pay | Admitting: Dentistry

## 2021-07-11 VITALS — BP 151/81 | HR 60 | Temp 99.3°F

## 2021-07-11 DIAGNOSIS — K029 Dental caries, unspecified: Secondary | ICD-10-CM

## 2021-07-11 NOTE — Progress Notes (Signed)
Department of Dental Medicine    Service Date:   07/11/2021  Patient Name:  Jeffery Wilson Date of Birth:   1951/03/12 Medical Record Number: [id number]   TODAY'S VISIT: RESTORATIVE   DIAGNOSIS: Teeth #1 & #3 caries PROCEDURES: Restorative on #1 & #3 PLAN: Next visit:  ScRP upper right & lower right       07/11/2021  PROGRESS NOTE:    COVID-19 SCREENING:  The patient denies symptoms concerning for COVID-19 infection including fever, chills, cough, or newly developed shortness of breath.   HISTORY OF PRESENT ILLNESS: Jeffery Wilson is a 71 y.o. male who presents today for restorative treatment on teeth numbers 1 and 3 per treatment plan. Medical and dental history reviewed with the patient.   CHIEF COMPLAINT:  Here for a routine dental appointment; patient with no complaints.  He did say he cracked tooth #9 a little when he was eating the other day.  He says it is not sensitive and it does not hurt, but it is noticeable esthetically.   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 ([place]) 05/12/2018   Counseling regarding advance care planning and goals of care 05/12/2018   Past Medical History:  Diagnosis Date   Cancer ([place])    Hypertension    Lymphoma ([place])    Pre-diabetes    Renal disorder    pt says stage 3 kidney dz    Past Surgical History:  Procedure Laterality Date   HAMMER TOE SURGERY     KNEE SURGERY     PAROTIDECTOMY Left 03/07/2021   Procedure: LEFT PAROTIDECTOMY WITH FACIAL NERVE DISSECTION;  Surgeon: [name], MD;  Location: [facility];  Service: ENT;  Laterality: Left;   No Known Allergies Current Outpatient Medications  Medication Sig Dispense Refill   acetaminophen (TYLENOL) 500 MG tablet Take 1,000 mg by mouth at bedtime.     Coal Tar Extract [phone] PSORIASIS MEDICATED EX) Apply 1 application topically daily.     CVS Omega-3 Krill Oil 350 MG  CAPS Take 350 mg by mouth daily.     Echinacea 650 MG CAPS Take 650 mg by mouth daily.     furosemide (LASIX) 40 MG tablet Take 40 mg by mouth daily.      HYDROcodone-acetaminophen (NORCO) 7.5-325 MG tablet Take 1 tablet by mouth every 6 (six) hours as needed for moderate pain. (Patient not taking: Reported on 05/29/2021) 20 tablet 0   hydroxypropyl methylcellulose / hypromellose (ISOPTO TEARS / GONIOVISC) 2.5 % ophthalmic solution Place 1 drop into both eyes 3 (three) times daily as needed for dry eyes.     metoprolol succinate (TOPROL-XL) 50 MG 24 hr tablet Take 50 mg by mouth daily. Take with or immediately following a meal.     Milk Thistle 1000 MG CAPS Take 1,000 mg by mouth daily.     Multiple Vitamins-Minerals (MULTIVITAMIN PO) Take 1 tablet by mouth daily.     ondansetron (ZOFRAN ODT) 8 MG disintegrating tablet Take 1 tablet (8 mg total) by mouth every 8 (eight) hours as needed for nausea or vomiting. (Patient not taking: Reported on 05/29/2021) 20 tablet 0   pravastatin (PRAVACHOL) 20 MG tablet Take 20 mg by mouth at bedtime.      traMADol (ULTRAM) 50 MG tablet Take 1 tablet (50 mg total) by mouth every 6 (six) hours as needed. (Patient not taking: Reported  on 05/29/2021) 10 tablet 0   Turmeric 500 MG CAPS Take 500 mg by mouth daily.     vitamin C (ASCORBIC ACID) 500 MG tablet Take 500 mg by mouth daily. (Patient not taking: Reported on 05/29/2021)     No current facility-administered medications for this visit.    LABS: Lab Results  Component Value Date   WBC 4.2 05/29/2021   HGB 15.4 05/29/2021   HCT 45.4 05/29/2021   MCV 97.6 05/29/2021   PLT 180 05/29/2021      Component Value Date/Time   NA 140 05/29/2021 0838   K 3.8 05/29/2021 0838   CL 104 05/29/2021 0838   CO2 24 05/29/2021 0838   GLUCOSE 87 05/29/2021 0838   BUN 22 05/29/2021 0838   CREATININE 1.60 (H) 05/29/2021 0838   CALCIUM 9.3 05/29/2021 0838   GFRNONAA 46 (L) 05/29/2021 0838   GFRAA 57 (L) 03/06/2020 0919    Lab Results  Component Value Date   INR 1.05 01/28/2018   No results found for: PTT  Social History   Socioeconomic History   Marital status: Married    Spouse name: Not on file   Number of children: Not on file   Years of education: Not on file   Highest education level: Not on file  Occupational History   Not on file  Tobacco Use   Smoking status: Every Day    Packs/day: 0.15    Types: Cigarettes   Smokeless tobacco: Never   Tobacco comments:    1 cigarette per day  Vaping Use   Vaping Use: Never used  Substance and Sexual Activity   Alcohol use: Not Currently    Comment: 2-3/day "hard whiskey"   Drug use: No   Sexual activity: Not on file  Other Topics Concern   Not on file  Social History Narrative   Not on file   Social Determinants of Health   Financial Resource Strain: Not on file  Food Insecurity: Not on file  Transportation Needs: Not on file  Physical Activity: Not on file  Stress: Not on file  Social Connections: Not on file  Intimate Partner Violence: Not on file   History reviewed. No pertinent family history.   ANTIBIOTIC PROPHYLAXIS/OTHER PREMEDICATION: None indicated.   VITAL SIGNS: BP (!) 151/81 (BP Location: Right Arm, Patient Position: Sitting, Cuff Size: Normal)    Pulse 60    Temp 99.3 F (37.4 C) (Oral)    ASSESSMENT/INDICATION(S): Dental caries   PROCEDURES: Restorative treatment on teeth #1 MOB and #3 O. Anesthesia: Topical:  Benzocaine 20% applied Type of anesthesia used:  34 mg lidocaine, 0.018 mg epinephrine Location given:  #1 and #3 infiltration Aspiration negative. Composite restorations: Cotton roll isolation. Excavated decay from #1 and #3. Teeth numbers 1 MOB and 3 O prepared for composite. The extent of caries was into dentin. Etched enamel and dentin surfaces with 32% phosphoric acid for 15 seconds and rinsed thoroughly. Removed excess water with a brief burst of air. Optibond bonding agent placed and air  dried until no movement of bonding agent was seen and then light cured for 10 seconds. OMNICHROMA flowable composite material placed in increments and light-cured. Removed excess material with carbide finishing bur. Occlusion, margins and contacts were verified and adjusted as needed.  Restorations finished and polished. The patient was advised of possible normal sensitivity to hot and cold for the next few days/weeks.   PLAN:  Next visit:  Scaling & root planing- upper right/lower right Discussed  adding restorative treatment to fix chipped tooth #9 and patient would like to do this.  Also discussed lower removable partial denture since the patient is missing 4+ teeth on the bottom and his insurance would cover part of this.  He is agreeable to this as well and elected to move forward with updated treatment plan.  All questions and concerns were invited and addressed.  The patient tolerated today's visit well and departed in stable condition.  Charlaine Dalton, D.M.D.

## 2021-08-08 ENCOUNTER — Encounter (HOSPITAL_COMMUNITY): Payer: Self-pay | Admitting: Dentistry

## 2021-08-08 ENCOUNTER — Other Ambulatory Visit: Payer: Self-pay

## 2021-08-08 ENCOUNTER — Ambulatory Visit (INDEPENDENT_AMBULATORY_CARE_PROVIDER_SITE_OTHER): Payer: Medicare HMO | Admitting: Dentistry

## 2021-08-08 VITALS — BP 143/73 | HR 54 | Temp 99.1°F

## 2021-08-08 DIAGNOSIS — K05313 Chronic periodontitis, localized, severe: Secondary | ICD-10-CM

## 2021-08-08 DIAGNOSIS — K029 Dental caries, unspecified: Secondary | ICD-10-CM | POA: Diagnosis not present

## 2021-08-08 DIAGNOSIS — K0530 Chronic periodontitis, unspecified: Secondary | ICD-10-CM | POA: Diagnosis not present

## 2021-08-08 DIAGNOSIS — K0381 Cracked tooth: Secondary | ICD-10-CM

## 2021-08-08 DIAGNOSIS — K036 Deposits [accretions] on teeth: Secondary | ICD-10-CM | POA: Diagnosis not present

## 2021-08-08 NOTE — Progress Notes (Signed)
Department of Dental Medicine    Service Date:   08/08/2021  Patient Name:  Jeffery Wilson Date of Birth:   10-Feb-1951 Medical Record Number: [id number]   TODAY'S VISIT: ScRP + RESTORATIVE   DIAGNOSIS: Chronic localized severe periodontitis (tooth #31) Tooth #9 incisal chip through enamel & caries PROCEDURES: ScRP lower right (tooth #31) Restorative on #9 PLAN:   NV:  ScRP lower left & /P initial impressions         08/08/2021  PROGRESS NOTE:   HISTORY OF PRESENT ILLNESS: Jeffery Wilson is a 71 y.o. male who presents today for restorative treatment on tooth number 9 per treatment plan and #31 scaling and root planing. Medical and dental history reviewed with the patient.  No changes reported.   CHIEF COMPLAINT:  Here for a routine dental appointment; patient with no complaints.   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[place]) 05/12/2018   Counseling regarding advance care planning and goals of care 05/12/2018   Past Medical History:  Diagnosis Date   Cancer ([place])    Hypertension    Lymphoma ([place])    Pre-diabetes    Renal disorder    pt says stage 3 kidney dz    Past Surgical History:  Procedure Laterality Date   HAMMER TOE SURGERY     KNEE SURGERY     PAROTIDECTOMY Left 03/07/2021   Procedure: LEFT PAROTIDECTOMY WITH FACIAL NERVE DISSECTION;  Surgeon: [name], MD;  Location: [facility];  Service: ENT;  Laterality: Left;   No Known Allergies Current Outpatient Medications  Medication Sig Dispense Refill   acetaminophen (TYLENOL) 500 MG tablet Take 1,000 mg by mouth at bedtime.     Coal Tar Extract [phone] PSORIASIS MEDICATED EX) Apply 1 application topically daily.     [employer] Omega-3 Krill Oil 350 MG CAPS Take 350 mg by mouth daily.     Echinacea 650 MG CAPS Take 650 mg by mouth daily.     furosemide (LASIX) 40 MG tablet Take 40 mg by mouth daily.       HYDROcodone-acetaminophen (NORCO) 7.5-325 MG tablet Take 1 tablet by mouth every 6 (six) hours as needed for moderate pain. (Patient not taking: Reported on 05/29/2021) 20 tablet 0   hydroxypropyl methylcellulose / hypromellose (ISOPTO TEARS / GONIOVISC) 2.5 % ophthalmic solution Place 1 drop into both eyes 3 (three) times daily as needed for dry eyes.     metoprolol succinate (TOPROL-XL) 50 MG 24 hr tablet Take 50 mg by mouth daily. Take with or immediately following a meal.     Milk Thistle 1000 MG CAPS Take 1,000 mg by mouth daily.     Multiple Vitamins-Minerals (MULTIVITAMIN PO) Take 1 tablet by mouth daily.     ondansetron (ZOFRAN ODT) 8 MG disintegrating tablet Take 1 tablet (8 mg total) by mouth every 8 (eight) hours as needed for nausea or vomiting. (Patient not taking: Reported on 05/29/2021) 20 tablet 0   pravastatin (PRAVACHOL) 20 MG tablet Take 20 mg by mouth at bedtime.      traMADol (ULTRAM) 50 MG tablet Take 1 tablet (50 mg total) by mouth every 6 (six) hours as needed. (Patient not taking: Reported on 05/29/2021) 10 tablet 0   Turmeric 500 MG CAPS Take 500 mg by mouth daily.     vitamin C (ASCORBIC ACID) 500 MG tablet Take 500 mg by  mouth daily. (Patient not taking: Reported on 05/29/2021)     No current facility-administered medications for this visit.    LABS: Lab Results  Component Value Date   WBC 4.2 05/29/2021   HGB 15.4 05/29/2021   HCT 45.4 05/29/2021   MCV 97.6 05/29/2021   PLT 180 05/29/2021      Component Value Date/Time   NA 140 05/29/2021 0838   K 3.8 05/29/2021 0838   CL 104 05/29/2021 0838   CO2 24 05/29/2021 0838   GLUCOSE 87 05/29/2021 0838   BUN 22 05/29/2021 0838   CREATININE 1.60 (H) 05/29/2021 0838   CALCIUM 9.3 05/29/2021 0838   GFRNONAA 46 (L) 05/29/2021 0838   GFRAA 57 (L) 03/06/2020 0919   Lab Results  Component Value Date   INR 1.05 01/28/2018   No results found for: PTT  Social History   Socioeconomic History   Marital status:  Married    Spouse name: Not on file   Number of children: Not on file   Years of education: Not on file   Highest education level: Not on file  Occupational History   Not on file  Tobacco Use   Smoking status: Every Day    Packs/day: 0.15    Types: Cigarettes   Smokeless tobacco: Never   Tobacco comments:    1 cigarette per day  Vaping Use   Vaping Use: Never used  Substance and Sexual Activity   Alcohol use: Not Currently    Comment: 2-3/day "hard whiskey"   Drug use: No   Sexual activity: Not on file  Other Topics Concern   Not on file  Social History Narrative   Not on file   Social Determinants of Health   Financial Resource Strain: Not on file  Food Insecurity: Not on file  Transportation Needs: Not on file  Physical Activity: Not on file  Stress: Not on file  Social Connections: Not on file  Intimate Partner Violence: Not on file   No family history on file.   ANTIBIOTIC PROPHYLAXIS/OTHER PREMEDICATION: None indicated   VITAL SIGNS: BP (!) 143/73 (BP Location: Right Arm, Patient Position: Sitting, Cuff Size: Normal)    Pulse (!) 54    Temp 99.1 F (37.3 C) (Oral)    ASSESSMENT/INDICATION(S): Dental caries Chronic periodontitis   PROCEDURES: Restorative treatment on tooth #9 MIFL. Anesthesia: [None used] Composite restoration: Cotton roll isolation. Excavated decay from tooth #9 and roughened enamel and dentin surfaces.  Added retention grooves. Tooth #9 prepared for composite. The extent of caries was into dentin. Etched enamel and dentin surfaces  with 32% phosphoric acid for 15 seconds and rinsed thoroughly. Removed excess water with a brief burst of air. Optibond bonding agent placed and air dried until no movement of bonding agent was seen and then light cured for 10 seconds. OMNICHROMA flowable composite material placed in increments and light-cured. Removed excess material with carbide finishing bur. Occlusion, margins and contact were verified  and adjusted as needed.  Restoration finished and polished. The patient was advised of possible normal sensitivity to hot and cold for the next few days/weeks.  Scaling and root planing lower right quadrant. Anesthesia: Topical:  Benzocaine 20% applied Type of anesthesia used:  34 mg lidocaine, 0.018 mg epinephrine Location given:  #31 infiltration Aspiration negative. Tooth #31 ScRP: Removed all supra- and subgingival calculus and plaque using Cavitron and hand instruments.  Scaled last using hand instruments to ensure removal of all subgingival calculus that was accessible  Of  Note:  Probing depths >7 mm on ML and L sites. Plan is to monitor tooth and maintain it at this time unless the patient becomes symptomatic or tooth becomes periodontally hopeless. 2.   Good hemostasis was observed and achieved.   PLAN:  Call should any questions or concerns arise before next visit.    Next visit:  Scaling and root planing LLQ and /P initial impressions    All questions and concerns were invited and addressed.  The patient tolerated today's visit well and departed in stable condition.   Charlaine Dalton, D.M.D.

## 2021-09-12 ENCOUNTER — Encounter (HOSPITAL_COMMUNITY): Payer: Medicare HMO | Admitting: Dentistry

## 2021-11-27 ENCOUNTER — Inpatient Hospital Stay (HOSPITAL_BASED_OUTPATIENT_CLINIC_OR_DEPARTMENT_OTHER): Payer: Medicare HMO | Admitting: Hematology

## 2021-11-27 ENCOUNTER — Inpatient Hospital Stay: Payer: Medicare HMO | Attending: Hematology

## 2021-11-27 ENCOUNTER — Other Ambulatory Visit: Payer: Self-pay

## 2021-11-27 VITALS — BP 145/79 | HR 67 | Temp 97.9°F | Resp 19 | Ht 71.0 in | Wt 264.4 lb

## 2021-11-27 DIAGNOSIS — C8211 Follicular lymphoma grade II, lymph nodes of head, face, and neck: Secondary | ICD-10-CM

## 2021-11-27 DIAGNOSIS — Z8572 Personal history of non-Hodgkin lymphomas: Secondary | ICD-10-CM | POA: Insufficient documentation

## 2021-11-27 DIAGNOSIS — F17210 Nicotine dependence, cigarettes, uncomplicated: Secondary | ICD-10-CM | POA: Diagnosis not present

## 2021-11-27 LAB — CBC WITH DIFFERENTIAL (CANCER CENTER ONLY)
Abs Immature Granulocytes: 0.02 10*3/uL (ref 0.00–0.07)
Basophils Absolute: 0 10*3/uL (ref 0.0–0.1)
Basophils Relative: 1 %
Eosinophils Absolute: 0.1 10*3/uL (ref 0.0–0.5)
Eosinophils Relative: 2 %
HCT: 45.5 % (ref 39.0–52.0)
Hemoglobin: 15.5 g/dL (ref 13.0–17.0)
Immature Granulocytes: 0 %
Lymphocytes Relative: 18 %
Lymphs Abs: 0.9 10*3/uL (ref 0.7–4.0)
MCH: 33.8 pg (ref 26.0–34.0)
MCHC: 34.1 g/dL (ref 30.0–36.0)
MCV: 99.3 fL (ref 80.0–100.0)
Monocytes Absolute: 0.5 10*3/uL (ref 0.1–1.0)
Monocytes Relative: 11 %
Neutro Abs: 3.4 10*3/uL (ref 1.7–7.7)
Neutrophils Relative %: 68 %
Platelet Count: 171 10*3/uL (ref 150–400)
RBC: 4.58 MIL/uL (ref 4.22–5.81)
RDW: 12.7 % (ref 11.5–15.5)
WBC Count: 5 10*3/uL (ref 4.0–10.5)
nRBC: 0 % (ref 0.0–0.2)

## 2021-11-27 LAB — CMP (CANCER CENTER ONLY)
ALT: 20 U/L (ref 0–44)
AST: 22 U/L (ref 15–41)
Albumin: 4.6 g/dL (ref 3.5–5.0)
Alkaline Phosphatase: 57 U/L (ref 38–126)
Anion gap: 9 (ref 5–15)
BUN: 21 mg/dL (ref 8–23)
CO2: 26 mmol/L (ref 22–32)
Calcium: 9.8 mg/dL (ref 8.9–10.3)
Chloride: 103 mmol/L (ref 98–111)
Creatinine: 1.44 mg/dL — ABNORMAL HIGH (ref 0.61–1.24)
GFR, Estimated: 52 mL/min — ABNORMAL LOW (ref >60)
Glucose, Bld: 123 mg/dL — ABNORMAL HIGH (ref 70–99)
Potassium: 3.9 mmol/L (ref 3.5–5.1)
Sodium: 138 mmol/L (ref 135–145)
Total Bilirubin: 0.7 mg/dL (ref 0.3–1.2)
Total Protein: 7.4 g/dL (ref 6.5–8.1)

## 2021-11-27 LAB — LACTATE DEHYDROGENASE: LDH: 129 U/L (ref 98–192)

## 2021-11-28 ENCOUNTER — Encounter (HOSPITAL_COMMUNITY): Payer: Self-pay | Admitting: Dentistry

## 2021-11-28 ENCOUNTER — Ambulatory Visit (INDEPENDENT_AMBULATORY_CARE_PROVIDER_SITE_OTHER): Payer: Medicare HMO | Admitting: Dentistry

## 2021-11-28 VITALS — BP 144/84 | HR 57 | Temp 98.8°F

## 2021-11-28 DIAGNOSIS — K032 Erosion of teeth: Secondary | ICD-10-CM

## 2021-11-28 DIAGNOSIS — K036 Deposits [accretions] on teeth: Secondary | ICD-10-CM | POA: Diagnosis not present

## 2021-11-28 DIAGNOSIS — K06020 Generalized gingival recession, unspecified: Secondary | ICD-10-CM

## 2021-11-28 DIAGNOSIS — Z923 Personal history of irradiation: Secondary | ICD-10-CM

## 2021-11-28 DIAGNOSIS — Z0120 Encounter for dental examination and cleaning without abnormal findings: Secondary | ICD-10-CM | POA: Diagnosis not present

## 2021-11-28 DIAGNOSIS — K030 Excessive attrition of teeth: Secondary | ICD-10-CM

## 2021-11-28 DIAGNOSIS — K0889 Other specified disorders of teeth and supporting structures: Secondary | ICD-10-CM

## 2021-11-28 DIAGNOSIS — K08109 Complete loss of teeth, unspecified cause, unspecified class: Secondary | ICD-10-CM

## 2021-11-28 DIAGNOSIS — K0530 Chronic periodontitis, unspecified: Secondary | ICD-10-CM

## 2021-11-28 DIAGNOSIS — K0850 Unsatisfactory restoration of tooth, unspecified: Secondary | ICD-10-CM

## 2021-11-28 DIAGNOSIS — K029 Dental caries, unspecified: Secondary | ICD-10-CM

## 2021-11-28 NOTE — Progress Notes (Signed)
Department of Dental Medicine   Service Date:   11/28/2021  Patient Name:  Jeffery Wilson Date of Birth:   1950/08/04 Medical Record Number: [id number]   TODAY'S VISIT: 6 MONTH RECALL   PROCEDURES: Adult prophylaxis, updated BW's, periodic exam & /P initial impressions ASSESSMENT: Soft tissue:  WNL Caries risk:  HIGH Periodontal impression:  Localized inflamed, erythematous gingival tissue & accretions on teeth Other findings:  #32 periodontal hopeless prognosis PLAN/RECOMMENDATIONS: Restorative, #32 Extraction, Lower RPD fabrication Continue w/ 6 mo recall visits (NV: Dec-23)  Next visit:  Restorative & /P final impression w/ custom tray   11/28/2021   HISTORY OF PRESENT ILLNESS: Jeffery Wilson is a very pleasant 71 y.o. male with h/o HTN, renal disease, lymphoma and follicular lymphoma of neck lymph nodes (status-post head and neck radiation therapy from 12/06/20-12/29/20) who presents today for a periodic dental exam, cleaning, updated radiographs and initial impressions for lower removable partial denture. Medical and dental history were reviewed with the patient.  No reported changes.  He continues to do well. He currently denies any dental/orofacial pain or sensitivity. His last exam + cleaning was on 02/28/21.  Patient is able to manage oral secretions.  Patient denies dysphagia, odynophagia, dysphonia, SOB and neck pain.  Patient denies fever, rigors and malaise.   CHIEF COMPLAINT: Here for a 6 month recall visit; patient with no complaints.   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[place]) 05/12/2018   Counseling regarding advance care planning and goals of care 05/12/2018   Past Medical History:  Diagnosis Date   Cancer ([place])    Hypertension    Lymphoma ([place])    Pre-diabetes    Renal disorder     pt says stage 3 kidney dz    Past Surgical History:  Procedure Laterality Date   HAMMER TOE SURGERY     KNEE SURGERY     PAROTIDECTOMY Left 03/07/2021   Procedure: LEFT PAROTIDECTOMY WITH FACIAL NERVE DISSECTION;  Surgeon: [name], MD;  Location: [facility];  Service: ENT;  Laterality: Left;   No Known Allergies Current Outpatient Medications  Medication Sig Dispense Refill   acetaminophen (TYLENOL) 500 MG tablet Take 1,000 mg by mouth at bedtime.     Coal Tar Extract [phone] PSORIASIS MEDICATED EX) Apply 1 application topically daily.     [employer] Omega-3 Krill Oil 350 MG CAPS Take 350 mg by mouth daily.     Echinacea 650 MG CAPS Take 650 mg by mouth daily.     furosemide (LASIX) 40 MG tablet Take 40 mg by mouth daily.      HYDROcodone-acetaminophen (NORCO) 7.5-325 MG tablet Take 1 tablet by mouth every 6 (six) hours as needed for moderate pain. (Patient not taking: Reported on 05/29/2021) 20 tablet 0   hydroxypropyl methylcellulose / hypromellose (ISOPTO TEARS / GONIOVISC) 2.5 % ophthalmic solution Place 1 drop into both eyes 3 (three) times daily as needed for dry eyes.     metoprolol succinate (TOPROL-XL) 50 MG 24 hr tablet Take 50 mg by mouth daily. Take with or immediately following a meal.     Milk Thistle 1000 MG CAPS Take 1,000 mg by mouth daily.     Multiple Vitamins-Minerals (MULTIVITAMIN PO) Take 1 tablet by mouth daily.     ondansetron (ZOFRAN ODT) 8 MG disintegrating tablet  Take 1 tablet (8 mg total) by mouth every 8 (eight) hours as needed for nausea or vomiting. (Patient not taking: Reported on 05/29/2021) 20 tablet 0   pravastatin (PRAVACHOL) 20 MG tablet Take 20 mg by mouth at bedtime.      traMADol (ULTRAM) 50 MG tablet Take 1 tablet (50 mg total) by mouth every 6 (six) hours as needed. (Patient not taking: Reported on 05/29/2021) 10 tablet 0   Turmeric 500 MG CAPS Take 500 mg by mouth daily.     vitamin C (ASCORBIC ACID) 500 MG tablet Take 500 mg by mouth daily. (Patient not  taking: Reported on 05/29/2021)     No current facility-administered medications for this visit.    LABS: Lab Results  Component Value Date   WBC 5.0 11/27/2021   HGB 15.5 11/27/2021   HCT 45.5 11/27/2021   MCV 99.3 11/27/2021   PLT 171 11/27/2021      Component Value Date/Time   NA 138 11/27/2021 0940   K 3.9 11/27/2021 0940   CL 103 11/27/2021 0940   CO2 26 11/27/2021 0940   GLUCOSE 123 (H) 11/27/2021 0940   BUN 21 11/27/2021 0940   CREATININE 1.44 (H) 11/27/2021 0940   CALCIUM 9.8 11/27/2021 0940   GFRNONAA 52 (L) 11/27/2021 0940   GFRAA 57 (L) 03/06/2020 0919   Lab Results  Component Value Date   INR 1.05 01/28/2018   No results found for: PTT  Social History   Socioeconomic History   Marital status: Married    Spouse name: Not on file   Number of children: Not on file   Years of education: Not on file   Highest education level: Not on file  Occupational History   Not on file  Tobacco Use   Smoking status: Every Day    Packs/day: 0.15    Types: Cigarettes   Smokeless tobacco: Never   Tobacco comments:    1 cigarette per day  Vaping Use   Vaping Use: Never used  Substance and Sexual Activity   Alcohol use: Not Currently    Comment: 2-3/day "hard whiskey"   Drug use: No   Sexual activity: Not on file  Other Topics Concern   Not on file  Social History Narrative   Not on file   Social Determinants of Health   Financial Resource Strain: Not on file  Food Insecurity: Not on file  Transportation Needs: Not on file  Physical Activity: Not on file  Stress: Not on file  Social Connections: Not on file  Intimate Partner Violence: Not on file   History reviewed. No pertinent family history.    REVIEW OF SYSTEMS:  Reviewed with the patient as per HPI. Psych:  Patient denies having dental phobia.   VITAL SIGNS: BP (!) 144/84 (BP Location: Right Arm, Patient Position: Sitting, Cuff Size: Normal)   Pulse (!) 57   Temp 98.8 F (37.1 C) (Oral)     PHYSICAL EXAM:  Soft tissue exam completed and charted.  General:  Well-developed, comfortable and in no apparent distress. Neurological:  Alert and oriented to person, place and  time. Extraoral:  Head size, head shape, facial symmetry, skin, complexion, pigmentation, conjunctiva, oral labia, parotid gland, submandibular gland, thyroid, cervical and preauricular lymph nodes, submandibular and submental lymph nodes, tonsillar and occipital lymph nodes and supraclavicular lymph nodes WNL. No swelling or lymphadenopathy.  TMJ asymptomatic without clicks or crepitations. Intraoral:  Soft tissues appear well-perfused and mucous membranes moist.  FOM and vestibules soft  and not raised. Oral cavity without mass or lesion. No signs of infection, parulis, sinus tract, edema or erythema evident upon exam.      HARD TISSUE:  Caries:  Restorable:  #3, #4, #21  Defective Restorations:  #4 existing occlusal amalgam with recurrent decay  Occlusion:   Non-functional teeth:  #1, #14, #20, #21   Supra-erupted teeth:  #1, #14   Other:  Drifting mesial- #18, #32 Other findings:   (+) Attrition/wear:  #6-#11 incisal, #22-#27 incisal (+) Abfraction:  #4B(V), #5B(V), #18B(V)      PERIODONTAL:  Plaque:  Moderate generalized surrounding posterior teeth Calculus:  Mild accumulation on posterior molars + lower anterior teeth lingual surfaces Mobility:  Class 3-  #32 Periodontally compromised teeth:  #32 hopeless Gingival appearance:  Pink, healthy gingival tissue with blunted papilla with localized areas of inflamed, erythematous gingival tissue.  Exam performed by [facility] B. Benson Norway, D.M.D. with Leta Speller, DAII acting as scribe.   RADIOGRAPHIC EXAM:  Updated radiographs included 4 Bitewings that were exposed and interpreted.   Localized moderate horizontal bone loss consistent with moderate periodontitis. #32 vertical bone loss evident. #18 & #32 drifting mesial.   Missing teeth, existing  restorations.  Caries- #57M, #87M&D & #21D.   ASSESSMENT:  1. Periodic dental exam and cleaning 2. History of head and neck radiation 3. Caries 4.  Missing teeth 5.  Accretions on teeth 6.  Chronic periodontitis 7.  Loose teeth (#32) 8.  Defective restoration (#4) 9.  Attrition/wear 10.  Abfraction/flexure   PROCEDURES: Adult prophylaxis. Removed all supra- and subgingival calculus and plaque using Cavitron and hand instruments. Polished all teeth.  Flossed. Oral hygiene instruction discussed with the patient. Toothbrush, toothpaste and floss given to the patient.  Initial impressions for Mandibular acrylic removable partial denture. Upper and lower impressions taken in alginate.  Impressions poured up with Type IV Microstone in lab.   PLAN AND RECOMMENDATIONS: I explained all significant findings of the dental exam with the patient including a couple of new small cavities in between teeth, plaque and calculus or tartar build-up and the now hopeless periodontal prognosis of tooth #32 (tooth that we were previously watching) due to worsening mobility and drifting, and the recommended care including restorations on teeth #3, #4 and #21 and extraction of tooth #32 before delivering lower partial denture.   The patient verbalized understanding of all findings, discussion, and recommendations.  He elected to proceed with recommended treatment of fillings and extraction of tooth #32 prior to delivering or finishing his lower partial.  Continue with 6 month recall interval (next recall: December 2023) Call if questions or concerns arise before next visit.   Next visit:  #21DO (restorative) and /P final impression with custom tray  All questions and concerns were invited and addressed.  The patient tolerated today's visit well and departed in stable condition.  Charlaine Dalton, D.M.D.

## 2021-12-02 NOTE — Progress Notes (Signed)
HEMATOLOGY/ONCOLOGY CLINIC NOTE  Date of Service: .11/27/2021   Patient Care Team: [name], MD as PCP - General (Family Medicine) Malmfelt, [name], RN as Oncology Nurse Navigator [name], Cloria Spring, MD as Consulting Physician (Hematology) [name], MD as Consulting Physician (Radiation Oncology)  CHIEF COMPLAINTS/PURPOSE OF CONSULTATION:  Follow-up for follicular lymphoma  HISTORY OF PRESENTING ILLNESS:  See previous note for detail  Interval History:  Jeffery Wilson is here for continued evaluation and management of his follicular lymphoma.  Patient notes no acute new symptoms since his last clinic visit 6 months ago.  No new lumps or bumps.  No new neck swelling.  No fevers no chills no night sweats no unexpected weight loss.  No abdominal pain or distention.  No new shortness of breath. Labs done today were reviewed in detail with him.  MEDICAL HISTORY:  Past Medical History:  Diagnosis Date   Cancer ([place])    Hypertension    Lymphoma ([place])    Pre-diabetes    Renal disorder    pt says stage 3 kidney dz     SURGICAL HISTORY: Past Surgical History:  Procedure Laterality Date   HAMMER TOE SURGERY     KNEE SURGERY     PAROTIDECTOMY Left 03/07/2021   Procedure: LEFT PAROTIDECTOMY WITH FACIAL NERVE DISSECTION;  Surgeon: [name], MD;  Location: [facility];  Service: ENT;  Laterality: Left;    SOCIAL HISTORY: Social History   Socioeconomic History   Marital status: Married    Spouse name: Not on file   Number of children: Not on file   Years of education: Not on file   Highest education level: Not on file  Occupational History   Not on file  Tobacco Use   Smoking status: Every Day    Packs/day: 0.15    Types: Cigarettes   Smokeless tobacco: Never   Tobacco comments:    1 cigarette per day  Vaping Use   Vaping Use: Never used  Substance and Sexual Activity   Alcohol use: Not Currently    Comment: 2-3/day "hard whiskey"   Drug use:  No   Sexual activity: Not on file  Other Topics Concern   Not on file  Social History Narrative   Not on file   Social Determinants of Health   Financial Resource Strain: Not on file  Food Insecurity: Not on file  Transportation Needs: Not on file  Physical Activity: Not on file  Stress: Not on file  Social Connections: Not on file  Intimate Partner Violence: Not on file    FAMILY HISTORY: No family history on file.  ALLERGIES:  has No Known Allergies.  MEDICATIONS:  Current Outpatient Medications  Medication Sig Dispense Refill   acetaminophen (TYLENOL) 500 MG tablet Take 1,000 mg by mouth at bedtime.     Coal Tar Extract [phone] PSORIASIS MEDICATED EX) Apply 1 application topically daily.     [employer] Omega-3 Krill Oil 350 MG CAPS Take 350 mg by mouth daily.     Echinacea 650 MG CAPS Take 650 mg by mouth daily.     furosemide (LASIX) 40 MG tablet Take 40 mg by mouth daily.      HYDROcodone-acetaminophen (NORCO) 7.5-325 MG tablet Take 1 tablet by mouth every 6 (six) hours as needed for moderate pain. (Patient not taking: Reported on 05/29/2021) 20 tablet 0   hydroxypropyl methylcellulose / hypromellose (ISOPTO TEARS / GONIOVISC) 2.5 % ophthalmic solution Place 1 drop into both eyes 3 (  three) times daily as needed for dry eyes.     metoprolol succinate (TOPROL-XL) 50 MG 24 hr tablet Take 50 mg by mouth daily. Take with or immediately following a meal.     Milk Thistle 1000 MG CAPS Take 1,000 mg by mouth daily.     Multiple Vitamins-Minerals (MULTIVITAMIN PO) Take 1 tablet by mouth daily.     ondansetron (ZOFRAN ODT) 8 MG disintegrating tablet Take 1 tablet (8 mg total) by mouth every 8 (eight) hours as needed for nausea or vomiting. (Patient not taking: Reported on 05/29/2021) 20 tablet 0   pravastatin (PRAVACHOL) 20 MG tablet Take 20 mg by mouth at bedtime.      traMADol (ULTRAM) 50 MG tablet Take 1 tablet (50 mg total) by mouth every 6 (six) hours as needed. (Patient not taking:  Reported on 05/29/2021) 10 tablet 0   Turmeric 500 MG CAPS Take 500 mg by mouth daily.     vitamin C (ASCORBIC ACID) 500 MG tablet Take 500 mg by mouth daily. (Patient not taking: Reported on 05/29/2021)     No current facility-administered medications for this visit.    REVIEW OF SYSTEMS:   10 Point review of Systems was done is negative except as noted above.   PHYSICAL EXAMINATION: ECOG PERFORMANCE STATUS: 1 - Symptomatic but completely ambulatory  Vitals:   11/27/21 1010  BP: (!) 145/79  Pulse: 67  Resp: 19  Temp: 97.9 F (36.6 C)  SpO2: 100%   Filed Weights   11/27/21 1010  Weight: 264 lb 6.4 oz (119.9 kg)   .Body mass index is 36.88 kg/m.  Marland Kitchen NAD GENERAL:alert, in no acute distress and comfortable SKIN: no acute rashes, no significant lesions EYES: conjunctiva are pink and non-injected, sclera anicteric OROPHARYNX: MMM, no exudates, no oropharyngeal erythema or ulceration NECK: supple, no JVD LYMPH:  no palpable lymphadenopathy in the cervical, axillary or inguinal regions LUNGS: clear to auscultation b/l with normal respiratory effort HEART: regular rate & rhythm ABDOMEN:  normoactive bowel sounds , non tender, not distended. Extremity: no pedal edema PSYCH: alert & oriented x 3 with fluent speech NEURO: no focal motor/sensory deficits     LABORATORY DATA:  I have reviewed the data as listed  .    Latest Ref Rng & Units 11/27/2021    9:40 AM 05/29/2021    8:38 AM 03/20/2021    9:45 AM  CBC  WBC 4.0 - 10.5 K/uL 5.0  4.2  9.6   Hemoglobin 13.0 - 17.0 g/dL 15.5  15.4  14.5   Hematocrit 39.0 - 52.0 % 45.5  45.4  41.6   Platelets 150 - 400 K/uL 171  180  209     .    Latest Ref Rng & Units 11/27/2021    9:40 AM 05/29/2021    8:38 AM 03/20/2021    9:45 AM  CMP  Glucose 70 - 99 mg/dL 123  87  107   BUN 8 - 23 mg/dL '21  22  15   '$ Creatinine 0.61 - 1.24 mg/dL 1.44  1.60  1.42   Sodium 135 - 145 mmol/L 138  140  140   Potassium 3.5 - 5.1 mmol/L 3.9  3.8  3.6    Chloride 98 - 111 mmol/L 103  104  102   CO2 22 - 32 mmol/L '26  24  25   '$ Calcium 8.9 - 10.3 mg/dL 9.8  9.3  9.8   Total Protein 6.5 - 8.1 g/dL 7.4  7.5  7.4   Total Bilirubin 0.3 - 1.2 mg/dL 0.7  0.8  1.1   Alkaline Phos 38 - 126 U/L 57  73  64   AST 15 - 41 U/L '22  21  16   '$ ALT 0 - 44 U/L '20  15  14    '$ . Lab Results  Component Value Date   LDH 129 11/27/2021    Component     Latest Ref Rng & Units 03/24/2018  Hepatitis B Surface Ag     Negative Negative  Hep B Core Ab, Tot     Negative Negative  HIV Screen 4th Generation wRfx     Non Reactive Non Reactive  HCV Ab     0.0 - 0.9 s/co ratio <0.1  LDH     98 - 192 U/L 139   . Lab Results  Component Value Date   LDH 129 11/27/2021    01/28/18 Submandibular gland biopsy:    RADIOGRAPHIC STUDIES: I have personally reviewed the radiological images as listed and agreed with the findings in the report. No results found.  ASSESSMENT & PLAN:   71 y.o. male with  1. 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 Additional nonenlarged lymph nodes in the left neck shows low level FDG accumulation, suspicious for tumor involvement. 2. Tiny right groin lymph node shows low level FDG uptake, indeterminate. Otherwise, no evidence for hypermetabolic disease in the chest, abdomen, or pelvis. 3. Hepatic steatosis. 4. Cholelithiasis. 5. Colonic diverticulosis without diverticulitis.   04/24/18 Left cervical lymph node incisional biopsy revealed Follicular lymphoma, Grade 1-2 with a 10% proliferation rate   S/p 4 weekly  cycles of Rituxan (limited rate) completed on 06/09/18  08/04/18 PET/CT revealed Response to therapy, as evidenced by mildly decreased size and hypermetabolism within a dominant left submandibular mass. The previously described left-sided cervical nodal hypermetabolism has resolved. 2. Decrease in right inguinal nodal hypermetabolism, favored to be physiologic or reactive. 3. No new or progressive disease. 4. Incidental findings, including cholelithiasis, left adrenal adenoma.  PLAN: -Patient's labs today were discussed with him in details CBC stable CMP stable LDH within normal limits -Patient has no lab evidence or clinical signs or symptoms of significant lymphoma progression at this time. -No indication for additional treatment of the patient's follicular lymphoma at this time.. -We will continue to monitor him with labs and H&P and imaging as needed  FOLLOW UP: Return to clinic with Dr. [name] with labs in 6 months  The total time spent in the appointment was 20 minutes*.  All of the patient's questions were answered with apparent satisfaction. The patient knows to call the clinic with any problems, questions or concerns.   Jeffery Lone MD MS AAHIVMS [facility] Hematology/Oncology Physician [facility]  .*Total Encounter Time as defined by the Centers for Medicare and Medicaid Services includes, in addition to the face-to-face time of a patient visit (documented in the note above) non-face-to-face time: obtaining and reviewing outside history, ordering and reviewing medications, tests or procedures, care coordination (communications with other health care professionals or caregivers)  and documentation in the medical record.

## 2021-12-02 NOTE — Progress Notes (Incomplete)
HEMATOLOGY/ONCOLOGY CLINIC NOTE  Date of Service: .11/27/2021   Patient Care Team: [name], MD as PCP - General (Family Medicine) Malmfelt, [name], RN as Oncology Nurse Navigator [name], Cloria Spring, MD as Consulting Physician (Hematology) [name], MD as Consulting Physician (Radiation Oncology)  CHIEF COMPLAINTS/PURPOSE OF CONSULTATION:  Follow-up for follicular lymphoma  HISTORY OF PRESENTING ILLNESS:  See previous note for detail  Interval History:  [name]  MEDICAL HISTORY:  Past Medical History:  Diagnosis Date  . Cancer ([place])   . Hypertension   . Lymphoma ([place])   . Pre-diabetes   . Renal disorder    pt says stage 3 kidney dz     SURGICAL HISTORY: Past Surgical History:  Procedure Laterality Date  . HAMMER TOE SURGERY    . KNEE SURGERY    . PAROTIDECTOMY Left 03/07/2021   Procedure: LEFT PAROTIDECTOMY WITH FACIAL NERVE DISSECTION;  Surgeon: [name], MD;  Location: [facility];  Service: ENT;  Laterality: Left;    SOCIAL HISTORY: Social History   Socioeconomic History  . Marital status: Married    Spouse name: Not on file  . Number of children: Not on file  . Years of education: Not on file  . Highest education level: Not on file  Occupational History  . Not on file  Tobacco Use  . Smoking status: Every Day    Packs/day: 0.15    Types: Cigarettes  . Smokeless tobacco: Never  . Tobacco comments:    1 cigarette per day  Vaping Use  . Vaping Use: Never used  Substance and Sexual Activity  . Alcohol use: Not Currently    Comment: 2-3/day "hard whiskey"  . Drug use: No  . Sexual activity: Not on file  Other Topics Concern  . Not on file  Social History Narrative  . Not on file   Social Determinants of Health   Financial Resource Strain: Not on file  Food Insecurity: Not on file  Transportation Needs: Not on file  Physical Activity: Not on file  Stress: Not on file  Social Connections: Not on file  Intimate  Partner Violence: Not on file    FAMILY HISTORY: No family history on file.  ALLERGIES:  has No Known Allergies.  MEDICATIONS:  Current Outpatient Medications  Medication Sig Dispense Refill  . acetaminophen (TYLENOL) 500 MG tablet Take 1,000 mg by mouth at bedtime.    Beryl Meager Tar Extract [phone] PSORIASIS MEDICATED EX) Apply 1 application topically daily.    . [employer] Omega-3 Krill Oil 350 MG CAPS Take 350 mg by mouth daily.    . Echinacea 650 MG CAPS Take 650 mg by mouth daily.    . furosemide (LASIX) 40 MG tablet Take 40 mg by mouth daily.     Marland Kitchen HYDROcodone-acetaminophen (NORCO) 7.5-325 MG tablet Take 1 tablet by mouth every 6 (six) hours as needed for moderate pain. (Patient not taking: Reported on 05/29/2021) 20 tablet 0  . hydroxypropyl methylcellulose / hypromellose (ISOPTO TEARS / GONIOVISC) 2.5 % ophthalmic solution Place 1 drop into both eyes 3 (three) times daily as needed for dry eyes.    . metoprolol succinate (TOPROL-XL) 50 MG 24 hr tablet Take 50 mg by mouth daily. Take with or immediately following a meal.    . Milk Thistle 1000 MG CAPS Take 1,000 mg by mouth daily.    . Multiple Vitamins-Minerals (MULTIVITAMIN PO) Take 1 tablet by mouth daily.    . ondansetron (ZOFRAN ODT) 8  MG disintegrating tablet Take 1 tablet (8 mg total) by mouth every 8 (eight) hours as needed for nausea or vomiting. (Patient not taking: Reported on 05/29/2021) 20 tablet 0  . pravastatin (PRAVACHOL) 20 MG tablet Take 20 mg by mouth at bedtime.     . traMADol (ULTRAM) 50 MG tablet Take 1 tablet (50 mg total) by mouth every 6 (six) hours as needed. (Patient not taking: Reported on 05/29/2021) 10 tablet 0  . Turmeric 500 MG CAPS Take 500 mg by mouth daily.    . vitamin C (ASCORBIC ACID) 500 MG tablet Take 500 mg by mouth daily. (Patient not taking: Reported on 05/29/2021)     No current facility-administered medications for this visit.    REVIEW OF SYSTEMS:   .10 Point review of Systems was done is negative  except as noted above.   PHYSICAL EXAMINATION: ECOG PERFORMANCE STATUS: 1 - Symptomatic but completely ambulatory  Vitals:   11/27/21 1010  BP: (!) 145/79  Pulse: 67  Resp: 19  Temp: 97.9 F (36.6 C)  SpO2: 100%   Filed Weights   11/27/21 1010  Weight: 264 lb 6.4 oz (119.9 kg)   .Body mass index is 36.88 kg/m.  Marland Kitchen GENERAL:alert, in no acute distress and comfortable SKIN: no acute rashes, no significant lesions EYES: conjunctiva are pink and non-injected, sclera anicteric OROPHARYNX: MMM, no exudates, no oropharyngeal erythema or ulceration NECK: supple, no JVD, left submandibular/angle of the jaw mass has flattened out and significantly improved and does not note much noticeable at this time. LYMPH:  no palpable lymphadenopathy in the cervical, axillary or inguinal regions LUNGS: clear to auscultation b/l with normal respiratory effort HEART: regular rate & rhythm ABDOMEN:  normoactive bowel sounds , non tender, not distended. Extremity: no pedal edema PSYCH: alert & oriented x 3 with fluent speech NEURO: no focal motor/sensory deficits     LABORATORY DATA:  I have reviewed the data as listed  .    Latest Ref Rng & Units 11/27/2021    9:40 AM 05/29/2021    8:38 AM 03/20/2021    9:45 AM  CBC  WBC 4.0 - 10.5 K/uL 5.0  4.2  9.6   Hemoglobin 13.0 - 17.0 g/dL 15.5  15.4  14.5   Hematocrit 39.0 - 52.0 % 45.5  45.4  41.6   Platelets 150 - 400 K/uL 171  180  209     .    Latest Ref Rng & Units 11/27/2021    9:40 AM 05/29/2021    8:38 AM 03/20/2021    9:45 AM  CMP  Glucose 70 - 99 mg/dL 123  87  107   BUN 8 - 23 mg/dL '21  22  15   '$ Creatinine 0.61 - 1.24 mg/dL 1.44  1.60  1.42   Sodium 135 - 145 mmol/L 138  140  140   Potassium 3.5 - 5.1 mmol/L 3.9  3.8  3.6   Chloride 98 - 111 mmol/L 103  104  102   CO2 22 - 32 mmol/L '26  24  25   '$ Calcium 8.9 - 10.3 mg/dL 9.8  9.3  9.8   Total Protein 6.5 - 8.1 g/dL 7.4  7.5  7.4   Total Bilirubin 0.3 - 1.2 mg/dL 0.7  0.8  1.1    Alkaline Phos 38 - 126 U/L 57  73  64   AST 15 - 41 U/L '22  21  16   '$ ALT 0 - 44 U/L 20  15  14    Component     Latest Ref Rng & Units 03/24/2018  Hepatitis B Surface Ag     Negative Negative  Hep B Core Ab, Tot     Negative Negative  HIV Screen 4th Generation wRfx     Non Reactive Non Reactive  HCV Ab     0.0 - 0.9 s/co ratio <0.1  LDH     98 - 192 U/L 139   . Lab Results  Component Value Date   LDH 129 11/27/2021    01/28/18 Submandibular gland biopsy:    RADIOGRAPHIC STUDIES: I have personally reviewed the radiological images as listed and agreed with the findings in the report. No results found.  ASSESSMENT & PLAN:   71 y.o. male with  1. 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 Additional nonenlarged lymph nodes in the left neck shows low level FDG accumulation, suspicious for tumor involvement. 2. Tiny right groin lymph node shows low level FDG uptake, indeterminate. Otherwise, no evidence for hypermetabolic disease in the chest, abdomen, or pelvis. 3. Hepatic steatosis. 4. Cholelithiasis. 5. Colonic diverticulosis without diverticulitis.   04/24/18 Left cervical lymph node incisional biopsy revealed Follicular lymphoma, Grade 1-2 with a 10% proliferation rate   S/p 4 weekly cycles of Rituxan (limited rate) completed on 06/09/18  08/04/18 PET/CT revealed Response to therapy, as evidenced by mildly decreased size and hypermetabolism within a dominant left submandibular mass. The previously described left-sided cervical nodal hypermetabolism has  resolved. 2. Decrease in right inguinal nodal hypermetabolism, favored to be physiologic or reactive. 3. No new or progressive disease. 4. Incidental findings, including cholelithiasis, left adrenal adenoma.  PLAN: -Patients labs done today 05/29/2021 were discussed in details and show normal CBC, stable CMP and normal LDH levels. -He had swelling from a postoperative infection of his left submandibular lymphadenopathy site which has resolved with antibiotics and since this area has significantly shrunk in size and lymphadenopathy appears to have nearly completely resolved.  No significant visible or palpable adenopathy at this time. -Patient has no other clinical symptoms or lab findings of lymphoma recurrence or progression at this time. -No indication for additional treatment of his follicular lymphoma at this time. -He was recommended to follow-up with his dentist for continued optimal dental cares.  FOLLOW UP: Return to clinic with Dr. [name] with labs in 6 months   All of the patient's questions were answered with apparent satisfaction. The patient knows to call the clinic with any problems, questions or concerns.    Sullivan Lone MD [facility] AAHIVMS [facility] Hematology/Oncology Physician [facility]

## 2021-12-07 DIAGNOSIS — K0889 Other specified disorders of teeth and supporting structures: Secondary | ICD-10-CM | POA: Insufficient documentation

## 2021-12-07 DIAGNOSIS — K08109 Complete loss of teeth, unspecified cause, unspecified class: Secondary | ICD-10-CM | POA: Insufficient documentation

## 2021-12-07 DIAGNOSIS — K06020 Generalized gingival recession, unspecified: Secondary | ICD-10-CM | POA: Insufficient documentation

## 2021-12-07 DIAGNOSIS — K0850 Unsatisfactory restoration of tooth, unspecified: Secondary | ICD-10-CM | POA: Insufficient documentation

## 2021-12-07 DIAGNOSIS — K036 Deposits [accretions] on teeth: Secondary | ICD-10-CM | POA: Insufficient documentation

## 2021-12-07 DIAGNOSIS — K030 Excessive attrition of teeth: Secondary | ICD-10-CM | POA: Insufficient documentation

## 2021-12-07 DIAGNOSIS — Z923 Personal history of irradiation: Secondary | ICD-10-CM | POA: Insufficient documentation

## 2021-12-07 DIAGNOSIS — Z0120 Encounter for dental examination and cleaning without abnormal findings: Secondary | ICD-10-CM | POA: Insufficient documentation

## 2022-01-09 ENCOUNTER — Encounter (HOSPITAL_COMMUNITY): Payer: Self-pay | Admitting: Dentistry

## 2022-01-09 ENCOUNTER — Ambulatory Visit (INDEPENDENT_AMBULATORY_CARE_PROVIDER_SITE_OTHER): Payer: Medicare HMO | Admitting: Dentistry

## 2022-01-09 VITALS — BP 125/77 | HR 58 | Temp 98.1°F

## 2022-01-09 DIAGNOSIS — K029 Dental caries, unspecified: Secondary | ICD-10-CM | POA: Diagnosis not present

## 2022-01-09 DIAGNOSIS — K0850 Unsatisfactory restoration of tooth, unspecified: Secondary | ICD-10-CM | POA: Diagnosis not present

## 2022-01-09 NOTE — Progress Notes (Signed)
Department of Dental Medicine    Service Date:   01/09/2022  Patient Name:  Jeffery Wilson Date of Birth:   [birth year] Medical Record Number: [id number]  TODAY'S VISIT: RESTORATIVE   DIAGNOSIS: Teeth #4 and #21 caries PROCEDURES: Restorative on #4 and #21 PLAN: #32 extraction, /P fabrication Call if any questions or concerns arise before next visit.   NEXT VISIT:  Extraction of tooth #32   01/09/2022   HISTORY OF PRESENT ILLNESS: Jeffery Wilson is a 71 y.o. male who presents today for restorative treatment on teeth numbers 4 and 21 per treatment plan. Medical and dental history reviewed with the patient.  No changes reported.   CHIEF COMPLAINT:  Here for a routine dental appointment; patient with no complaints.  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[place]) 05/12/2018   Counseling regarding advance care planning and goals of care 05/12/2018   Past Medical History:  Diagnosis Date   Cancer ([place])    Hypertension    Lymphoma ([place])    Pre-diabetes    Renal disorder    pt says stage 3 kidney dz    Past Surgical History:  Procedure Laterality Date   HAMMER TOE SURGERY     KNEE SURGERY     PAROTIDECTOMY Left 03/07/2021   Procedure: LEFT PAROTIDECTOMY WITH FACIAL NERVE DISSECTION;  Surgeon: [name], MD;  Location: [facility];  Service: ENT;  Laterality: Left;   No Known Allergies Current Outpatient Medications  Medication Sig Dispense Refill    acetaminophen (TYLENOL) 500 MG tablet Take 1,000 mg by mouth at bedtime.     Coal Tar Extract [phone] PSORIASIS MEDICATED EX) Apply 1 application topically daily.     [employer] Omega-3 Krill Oil 350 MG CAPS Take 350 mg by mouth daily.     Echinacea 650 MG CAPS Take 650 mg by mouth daily.     furosemide (LASIX) 40 MG tablet Take 40 mg by mouth daily.      HYDROcodone-acetaminophen (NORCO) 7.5-325 MG tablet Take 1 tablet by mouth every 6 (six) hours as needed for moderate pain. (Patient not taking: Reported on 05/29/2021) 20 tablet 0   hydroxypropyl methylcellulose / hypromellose (ISOPTO TEARS / GONIOVISC) 2.5 % ophthalmic solution Place 1 drop into both eyes 3 (three) times daily as needed for dry eyes.     metoprolol succinate (TOPROL-XL) 50 MG 24 hr tablet Take 50 mg by mouth daily. Take with or immediately following a meal.     Milk Thistle 1000 MG CAPS Take 1,000 mg by mouth daily.     Multiple Vitamins-Minerals (MULTIVITAMIN PO) Take 1 tablet by mouth daily.     ondansetron (ZOFRAN ODT) 8 MG disintegrating tablet Take 1 tablet (8 mg total) by mouth every 8 (eight) hours as needed for nausea or vomiting. (Patient not taking: Reported on 05/29/2021) 20 tablet 0   pravastatin (PRAVACHOL) 20 MG tablet Take 20 mg by mouth at bedtime.      traMADol (ULTRAM) 50 MG tablet Take 1  tablet (50 mg total) by mouth every 6 (six) hours as needed. (Patient not taking: Reported on 05/29/2021) 10 tablet 0   Turmeric 500 MG CAPS Take 500 mg by mouth daily.     vitamin C (ASCORBIC ACID) 500 MG tablet Take 500 mg by mouth daily. (Patient not taking: Reported on 05/29/2021)     No current facility-administered medications for this visit.    LABS: Lab Results  Component Value Date   WBC 5.0 11/27/2021   HGB 15.5 11/27/2021   HCT 45.5 11/27/2021   MCV 99.3 11/27/2021   PLT 171 11/27/2021      Component Value Date/Time   NA 138 11/27/2021 0940   K 3.9 11/27/2021 0940   CL 103 11/27/2021 0940   CO2 26 11/27/2021 0940    GLUCOSE 123 (H) 11/27/2021 0940   BUN 21 11/27/2021 0940   CREATININE 1.44 (H) 11/27/2021 0940   CALCIUM 9.8 11/27/2021 0940   GFRNONAA 52 (L) 11/27/2021 0940   GFRAA 57 (L) 03/06/2020 0919   Lab Results  Component Value Date   INR 1.05 01/28/2018   No results found for: "PTT"  Social History   Socioeconomic History   Marital status: Married    Spouse name: Not on file   Number of children: Not on file   Years of education: Not on file   Highest education level: Not on file  Occupational History   Not on file  Tobacco Use   Smoking status: Every Day    Packs/day: 0.15    Types: Cigarettes   Smokeless tobacco: Never   Tobacco comments:    1 cigarette per day  Vaping Use   Vaping Use: Never used  Substance and Sexual Activity   Alcohol use: Not Currently    Comment: 2-3/day "hard whiskey"   Drug use: No   Sexual activity: Not on file  Other Topics Concern   Not on file  Social History Narrative   Not on file   Social Determinants of Health   Financial Resource Strain: Not on file  Food Insecurity: Not on file  Transportation Needs: Not on file  Physical Activity: Not on file  Stress: Not on file  Social Connections: Not on file  Intimate Partner Violence: Not on file   No family history on file.   ANTIBIOTIC PROPHYLAXIS/OTHER PREMEDICATION: None indicated.   VITAL SIGNS: BP 125/77 (BP Location: Right Arm, Patient Position: Sitting, Cuff Size: Normal)   Pulse (!) 58   Temp 98.1 F (36.7 C) (Oral)    RADIOGRAPH(S): BW+periapical images taken at previous visit(s) were reviewed.   ASSESSMENT/INDICATION(S): Dental caries   PROCEDURES: Restorative treatment on teeth #4MOD and #21DO. ANESTHESIA: Topical:  Benzocaine 20% applied Type of anesthesia used:  51 mg lidocaine, 0.027 mg epinephrine Location given:  Upper right quadrant infiltration, lower left quadrant mental nerve block+#21  infiltration Aspiration negative. COMPOSITE  RESTORATIONS: Cotton roll isolation. Excavated decay from teeth numbers 4 and 21. All existing amalgam was removed from tooth #4. Teeth numbers 4MOD and 21DO prepared for composite. The extent of caries was into dentin. Etched enamel and dentin surfaces  with 32% phosphoric acid for 15 seconds and rinsed thoroughly. Removed excess water with a brief burst of air. Optibond bonding agent placed and air dried until no movement of bonding agent was seen and then light cured for 10 seconds. OMNICHROMA flowable composite material was placed in increments and light-cured. Removed excess material with carbide finishing bur. Occlusion, margins and contacts were verified  and adjusted as needed.  Restorations finished and polished. The patient was advised of possible normal sensitivity to hot and cold for the next few days/weeks.   PLAN:  Call if any questions or concerns arise before next visit.   NEXT VISIT:  Extraction of tooth #32   All questions and concerns were invited and addressed.  The patient tolerated today's visit well and departed in stable condition.   - Bakary Bramer B. Benson [place], DMD

## 2022-01-23 ENCOUNTER — Ambulatory Visit (INDEPENDENT_AMBULATORY_CARE_PROVIDER_SITE_OTHER): Payer: Medicare HMO | Admitting: Dentistry

## 2022-01-23 ENCOUNTER — Encounter (HOSPITAL_COMMUNITY): Payer: Self-pay | Admitting: Dentistry

## 2022-01-23 VITALS — BP 132/75 | HR 57 | Temp 99.0°F

## 2022-01-23 DIAGNOSIS — K0530 Chronic periodontitis, unspecified: Secondary | ICD-10-CM | POA: Diagnosis not present

## 2022-01-23 DIAGNOSIS — K036 Deposits [accretions] on teeth: Secondary | ICD-10-CM

## 2022-01-23 DIAGNOSIS — K0889 Other specified disorders of teeth and supporting structures: Secondary | ICD-10-CM | POA: Diagnosis not present

## 2022-01-23 MED ORDER — HYDROCODONE-ACETAMINOPHEN 5-325 MG PO TABS: 1.0000 | ORAL_TABLET | Freq: Four times a day (QID) | ORAL | 0 refills | Status: AC | PRN

## 2022-01-23 NOTE — Progress Notes (Signed)
[facility] OF DENTAL MEDICINE   Service Date:    01/23/2022  Patient Name:   Jeffery Wilson Date of Birth:    [birth year] Medical Record Number:  [id number]  TODAY'S VISIT ORAL SURGERY   INDICATIONS/DIAGNOSIS: Tooth #32 periodontal hopeless prognosis PROCEDURES: Extraction of tooth #32 PLAN: /P final impression in 4-6 weeks 6 month recall interval Call if any questions or concerns arise before your next visit.  Rx:  NORCO 5-325 mg  NEXT VISIT:  Postop   01/23/2022   HISTORY OF PRESENT ILLNESS: Jeffery Wilson is a 71 y.o. male who presents today for extraction of tooth #32 with local anesthesia. Medical and dental history reviewed with the patient.  No changes were reported.   CHIEF COMPLAINT: Here for extraction of #32 per treatment plan.  0/10 pain scale.  Patient with no complaints.  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[place]) 05/12/2018   Counseling regarding advance care planning and goals of care 05/12/2018   Past Medical History:  Diagnosis Date   Cancer ([place])    Hypertension    Lymphoma ([place])    Pre-diabetes    Renal disorder    pt says stage 3 kidney dz    Past Surgical History:  Procedure Laterality Date   HAMMER TOE SURGERY     KNEE SURGERY     PAROTIDECTOMY Left 03/07/2021   Procedure: LEFT PAROTIDECTOMY WITH FACIAL NERVE DISSECTION;  Surgeon: [name], MD;  Location: [employer];  Service: ENT;  Laterality:  Left;   No Known Allergies Current Outpatient Medications  Medication Sig Dispense Refill   acetaminophen (TYLENOL) 500 MG tablet Take 1,000 mg by mouth at bedtime.     Coal Tar Extract [phone] PSORIASIS MEDICATED EX) Apply 1 application topically daily.     CVS Omega-3 Krill Oil 350 MG CAPS Take 350 mg by mouth daily.     Echinacea 650 MG CAPS Take 650 mg by mouth daily.     furosemide (LASIX) 40 MG tablet Take 40 mg by mouth daily.      HYDROcodone-acetaminophen (NORCO) 7.5-325 MG tablet Take 1 tablet by mouth every 6 (six) hours as needed for moderate pain. (Patient not taking: Reported on 05/29/2021) 20 tablet 0   hydroxypropyl methylcellulose / hypromellose (ISOPTO TEARS / GONIOVISC) 2.5 % ophthalmic solution Place 1 drop into both eyes 3 (three) times daily as needed for dry eyes.     metoprolol succinate (TOPROL-XL) 50 MG 24 hr tablet Take 50 mg by mouth daily. Take with or immediately following a meal.     Milk Thistle 1000 MG CAPS Take 1,000 mg by mouth daily.     Multiple Vitamins-Minerals (MULTIVITAMIN PO) Take 1 tablet by mouth daily.     ondansetron (ZOFRAN ODT) 8 MG disintegrating tablet Take 1 tablet (8 mg total) by mouth every 8 (eight) hours as needed for nausea or vomiting. (Patient not taking: Reported on 05/29/2021) 20 tablet 0  pravastatin (PRAVACHOL) 20 MG tablet Take 20 mg by mouth at bedtime.      traMADol (ULTRAM) 50 MG tablet Take 1 tablet (50 mg total) by mouth every 6 (six) hours as needed. (Patient not taking: Reported on 05/29/2021) 10 tablet 0   Turmeric 500 MG CAPS Take 500 mg by mouth daily.     vitamin C (ASCORBIC ACID) 500 MG tablet Take 500 mg by mouth daily. (Patient not taking: Reported on 05/29/2021)     No current facility-administered medications for this visit.    LABS: Lab Results  Component Value Date   WBC 5.0 11/27/2021   HGB 15.5 11/27/2021   HCT 45.5 11/27/2021   MCV 99.3 11/27/2021   PLT 171 11/27/2021      Component Value Date/Time   NA  138 11/27/2021 0940   K 3.9 11/27/2021 0940   CL 103 11/27/2021 0940   CO2 26 11/27/2021 0940   GLUCOSE 123 (H) 11/27/2021 0940   BUN 21 11/27/2021 0940   CREATININE 1.44 (H) 11/27/2021 0940   CALCIUM 9.8 11/27/2021 0940   GFRNONAA 52 (L) 11/27/2021 0940   GFRAA 57 (L) 03/06/2020 0919   Lab Results  Component Value Date   INR 1.05 01/28/2018   No results found for: "PTT"  Social History   Socioeconomic History   Marital status: Married    Spouse name: Not on file   Number of children: Not on file   Years of education: Not on file   Highest education level: Not on file  Occupational History   Not on file  Tobacco Use   Smoking status: Every Day    Packs/day: 0.15    Types: Cigarettes   Smokeless tobacco: Never   Tobacco comments:    1 cigarette per day  Vaping Use   Vaping Use: Never used  Substance and Sexual Activity   Alcohol use: Not Currently    Comment: 2-3/day "hard whiskey"   Drug use: No   Sexual activity: Not on file  Other Topics Concern   Not on file  Social History Narrative   Not on file   Social Determinants of Health   Financial Resource Strain: Not on file  Food Insecurity: Not on file  Transportation Needs: Not on file  Physical Activity: Not on file  Stress: Not on file  Social Connections: Not on file  Intimate Partner Violence: Not on file   No family history on file.   ANTIBIOTIC PROPHYLAXIS OR OTHER PREMEDICATION: [None indicated]   VITAL SIGNS: Preoperative:  BP 132/75 (BP Location: Right Arm, Patient Position: Sitting, Cuff Size: Normal)   Pulse (!) 57   Temp 99 F (37.2 C) (Oral)    ASSESSMENT: Tooth #32: periodontally hopeless prognosis   PROCEDURES:  Extraction of tooth #32 under local anesthesia. Note: PMP AWARE site was reviewed for the patient.  Pre-procedure: Time out completed: Verified patient's name, DOB and tooth to be extracted. Surgical consent: Reviewed with the patient and signed by the patient.   Leta Speller, DAII witnessed consent. Anesthesia: Topical:  Benzocaine 20% applied Type of anesthesia used:  34 mg lidocaine, 68 mg septocaine,0.036 mg epinephrine Location given:  LRQ: IANB, long-buccal nerve block, buccal and lingual infiltration.   Aspiration negative.  #32 Routine Extraction: Throat pack placed with 4x4 gauze. Attention turned to tooth number 32.  Soft tissue reflection with periosteal elevator.  Elevated sequentially with small+medium+large elevators.   Delivered tooth number 32 with 151 forceps without complications.  The throat pack  was then removed. Soft tissue was trimmed appropriately to aid in primary closure.  Socket was curetted and irrigated with copious amount of sterile saline. Hemostasis was achieved with gauze pressure. Postoperative instructions were given in a verbal and written format.  Recommend OTC pain medication to take as needed.  Prescription given for pain only if patient feels he needs it since he is unable to take NSAIDs. Instructed the patient not to take additional acetaminophen (Tylenol) when taking prescription pain medication.   PLAN/RECOMMENDATIONS: Mandibular partial denture final impression once he has healed from tooth extraction (about 4-6 weeks). Continue with 6 month recall interval. Call if any questions or concerns arise before next visit.  Rx:  NORCO 5-325 mg (PRN every 6 hours for up to 3 days for postoperative pain)   NEXT VISIT:  Postop   All questions and concerns were invited and addressed.  The patient tolerated today's procedure well and departed in stable condition.   Sandi Mariscal, DMD

## 2022-01-23 NOTE — Patient Instructions (Signed)
[facility] DEPARTMENT OF DENTAL MEDICINE Dr. Sandi Mariscal, DMD Phone: [phone] Fax: [phone]   MOUTH CARE AFTER SURGERY    FACTS: Ice used in ice bag helps keep the swelling down, and can help lessen the pain for the first 24 hours after surgery. It is easier to treat pain BEFORE it happens. Spitting disturbs the clot and may cause bleeding to start again, or to get worse. Smoking delays healing and can cause complications. Sharing prescriptions can be dangerous.  Do not take medications not recently prescribed for you. Antibiotics may stop birth control pills from working.  Use other means of birth control while on antibiotics. Warm salt water rinses after the first 24 hours will help lessen the swelling:  Use 1/2 teaspoonful of table salt per oz.of water.    DO NOT: Spit Drink through a straw It is strongly advised not to smoke, dip snuff or chew tobacco for at least 3 days. Eat sharp or crunchy foods.  Avoid the area of surgery when chewing. Stop your antibiotics before your instructions say to do so. Eat hot foods until bleeding has stopped.  If you need to, let your food cool down to room temperature.      WHAT TO EXPECT: Some swelling, especially during the first 2-3 days. Soreness or discomfort in varying degrees.  Follow your dentist's instructions about how to handle pain before it starts. Pinkish saliva or light blood in saliva, or on your pillow in the morning.  This can last around 24 hours. Bruising inside or outside the mouth.  This may not show up until 2-3 days after surgery.  Don't worry, it will go away in time. Pieces of "bone" may work themselves loose.  It's OK.  If they bother you, let us know.     WHAT TO DO IMMEDIATELY AFTER SURGERY: Bite on gauze with steady pressure for 30-45 minutes at a time.  Switch out the gauze after 30-45 minutes for clean gauze, and continue this for 1-2 hours or until bleeding  subsides. Do not chew on the gauze. Do not lie down flat.  Raise your head support especially for the first 24 hours. Apply ice to your face on the side of the surgery.  You may apply it 20 minutes on and a few minutes off.  Ice for 8-12 hours.  You may use ice up to 24 hours. Before the numbness wears off, take a pain pill as instructed. Prescription pain medication is not always required.     SWELLING: Expect swelling for the first couple of days.  It should get better after that. If swelling increases 3 days or so after surgery, let us know as soon as possible.    FEVER: Take Tylenol every 4 hours if needed to lower your temperature, especially if it is at 100oF or higher. Drink lots of fluids. If the fever does not go away, let us know.    BREATHING: Any unusual difficulty breathing means you have to have someone bring you to the emergency room ASAP.    BLEEDING: Light oozing is expected for 24 hours or so. Prop head up with pillows. Do not spit. Do not confuse bright red fresh flowing blood with lots of saliva colored with a little bit of blood. If you notice some bleeding, place gauze or a tea bag where it is bleeding and apply CONSTANT pressure by biting down for 1 hour.  Avoid talking during this time.  Do not remove  the gauze or tea bag during this hour to "check" the bleeding. If you notice bright RED bleeding FLOWING out of particular area, and filling the floor of your mouth, put a wad of gauze on that area, bite down firmly and constantly.  Call us immediately.  If we're closed, have someone bring you to the emergency room.     ORAL HYGIENE: Brush your teeth as usual after meals and before bedtime. Use a soft toothbrush around the area of surgery. DO NOT AVOID BRUSHING.  Otherwise bacteria(germs) will grow and may delay healing or encourage infection. Since you cannot spit, just gently rinse and let the water flow out of your mouth. DO NOT SWISH HARD.      EATING: Cool liquids are a good point to start.  Increase to soft foods as tolerated.     PRESCRIPTIONS: Follow the directions for your prescriptions exactly as written. If your doctor gave you a narcotic pain medication, do not drive, operate machinery or drink alcohol when on that medication.    Questions?  Call our office during office hours at [phone] or call the Emergency Room at [phone].

## 2022-02-06 ENCOUNTER — Ambulatory Visit (INDEPENDENT_AMBULATORY_CARE_PROVIDER_SITE_OTHER): Payer: Medicare HMO | Admitting: Dentistry

## 2022-02-06 ENCOUNTER — Encounter (HOSPITAL_COMMUNITY): Payer: Self-pay | Admitting: Dentistry

## 2022-02-06 VITALS — BP 134/74 | HR 52 | Temp 98.2°F

## 2022-02-06 DIAGNOSIS — K08199 Complete loss of teeth due to other specified cause, unspecified class: Secondary | ICD-10-CM

## 2022-02-06 NOTE — Progress Notes (Signed)
[facility] Department of Dental Medicine     TODAY'S VISIT POSTOP     ASSESSMENT: The patient continues to heal well and consistent with dental procedures performed.     PLAN: Return for /P final impression w/ custom tray    Service Date:   02/06/2022  Patient Name:   Jeffery Wilson Date of Birth:   06-14-1951 Medical Record Number: [id number]   HISTORY OF PRESENT ILLNESS: Jeffery Wilson presents today for a postoperative visit status post extraction of tooth #32.   Medical and dental history reviewed with the patient.  No changes reported.   CHIEF COMPLAINT:   Here for a postop appointment; patient with no complaints.   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[place]) 05/12/2018   Counseling regarding advance care planning and goals of care 05/12/2018   Past Medical History:  Diagnosis Date   Cancer ([place])    Hypertension    Lymphoma ([place])    Pre-diabetes    Renal disorder    pt says stage 3 kidney dz    Past Surgical History:  Procedure Laterality Date   HAMMER TOE SURGERY     KNEE SURGERY     PAROTIDECTOMY Left 03/07/2021   Procedure: LEFT PAROTIDECTOMY WITH FACIAL NERVE DISSECTION;  Surgeon: [name], MD;  Location: [facility];  Service: ENT;  Laterality: Left;   Current Outpatient Medications  Medication Sig Dispense Refill   acetaminophen (TYLENOL) 500 MG tablet Take 1,000 mg by mouth at bedtime.     Coal Tar Extract [phone]  PSORIASIS MEDICATED EX) Apply 1 application topically daily.     CVS Omega-3 Krill Oil 350 MG CAPS Take 350 mg by mouth daily.     Echinacea 650 MG CAPS Take 650 mg by mouth daily.     furosemide (LASIX) 40 MG tablet Take 40 mg by mouth daily.      HYDROcodone-acetaminophen (NORCO) 7.5-325 MG tablet Take 1 tablet by mouth every 6 (six) hours as needed for moderate pain. (Patient not taking: Reported on 05/29/2021) 20 tablet 0   hydroxypropyl methylcellulose / hypromellose (ISOPTO TEARS / GONIOVISC) 2.5 % ophthalmic solution Place 1 drop into both eyes 3 (three) times daily as needed for dry eyes.     metoprolol succinate (TOPROL-XL) 50 MG 24 hr tablet Take 50 mg by mouth daily. Take with or immediately following a meal.     Milk Thistle 1000 MG CAPS Take 1,000 mg by mouth daily.     Multiple Vitamins-Minerals (MULTIVITAMIN PO) Take 1 tablet by mouth daily.     ondansetron (ZOFRAN ODT) 8 MG disintegrating tablet Take 1 tablet (8 mg total) by mouth every 8 (eight) hours as needed for nausea or vomiting. (Patient not taking: Reported on 05/29/2021) 20 tablet 0   pravastatin (PRAVACHOL) 20 MG tablet Take 20 mg by mouth at bedtime.      traMADol (ULTRAM) 50 MG tablet Take 1 tablet (50 mg total) by mouth every 6 (six) hours as needed. (  Patient not taking: Reported on 05/29/2021) 10 tablet 0   Turmeric 500 MG CAPS Take 500 mg by mouth daily.     vitamin C (ASCORBIC ACID) 500 MG tablet Take 500 mg by mouth daily. (Patient not taking: Reported on 05/29/2021)     No current facility-administered medications for this visit.   No Known Allergies   LABS: Lab Results  Component Value Date   WBC 5.0 11/27/2021   HGB 15.5 11/27/2021   HCT 45.5 11/27/2021   MCV 99.3 11/27/2021   PLT 171 11/27/2021   BMET    Component Value Date/Time   NA 138 11/27/2021 0940   K 3.9 11/27/2021 0940   CL 103 11/27/2021 0940   CO2 26 11/27/2021 0940   GLUCOSE 123 (H) 11/27/2021 0940   BUN 21 11/27/2021 0940    CREATININE 1.44 (H) 11/27/2021 0940   CALCIUM 9.8 11/27/2021 0940   GFRNONAA 52 (L) 11/27/2021 0940    Lab Results  Component Value Date   INR 1.05 01/28/2018   No results found for: "PTT"   VITALS: BP 134/74 (BP Location: Right Arm, Patient Position: Sitting, Cuff Size: Normal)   Pulse (!) 52   Temp 98.2 F (36.8 C) (Oral)    EXAM: Extraction sites appear to be healing WNL.  No signs of wound dehiscence or infection evident upon examination.   ASSESSMENT:   Postoperative course is consistent with dental procedures performed.   PLAN AND RECOMMENDATIONS: Mandibular partial denture final impression with custom trays at next visit.  Call if any questions or concerns arise.  All questions and concerns were invited and addressed.  The patient tolerated today's visit well and departed in stable condition. - Liesa Tsan B. Benson [place], DMD

## 2022-03-06 ENCOUNTER — Ambulatory Visit (INDEPENDENT_AMBULATORY_CARE_PROVIDER_SITE_OTHER): Payer: Medicare HMO | Admitting: Dentistry

## 2022-03-06 ENCOUNTER — Encounter (HOSPITAL_COMMUNITY): Payer: Self-pay | Admitting: Dentistry

## 2022-03-06 VITALS — BP 126/72 | HR 59 | Temp 98.3°F

## 2022-03-06 DIAGNOSIS — K08109 Complete loss of teeth, unspecified cause, unspecified class: Secondary | ICD-10-CM

## 2022-03-06 NOTE — Progress Notes (Signed)
[facility]     TODAY'S VISIT:   DENTURES: FINAL IMPRESSIONS     DIAGNOSIS/INDICATIONS: Partially edentulous mandible     PROCEDURES: /P final impression, tooth+gingival shade selection   NEXT VISIT:  Wax try-in    Service Date:   03/06/2022  Patient Name:   Jeffery Wilson Date of Birth:   07-Dec-1950 Medical Record Number: [id number]   HISTORY OF PRESENT ILLNESS: Jeffery Wilson presents today for lower partial denture border molding and final impressions.  Medical and dental history reviewed with the patient.  No changes were reported.   CHIEF COMPLAINT: Here for a routine dental appointment.  Patient with no complaints.   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     Coal Tar Extract [phone] PSORIASIS MEDICATED EX) Apply 1 application topically daily.     [employer] Omega-3 Krill Oil 350 MG CAPS Take 350 mg by mouth daily.     Echinacea 650 MG CAPS Take 650 mg by mouth daily.      furosemide (LASIX) 40 MG tablet Take 40 mg by mouth daily.      HYDROcodone-acetaminophen (NORCO) 7.5-325 MG tablet Take 1 tablet by mouth every 6 (six) hours as needed for moderate pain. (Patient not taking: Reported on 05/29/2021) 20 tablet 0   hydroxypropyl methylcellulose / hypromellose (ISOPTO TEARS / GONIOVISC) 2.5 % ophthalmic solution Place 1 drop into both eyes 3 (three) times daily as needed for dry eyes.     metoprolol succinate (TOPROL-XL) 50 MG 24 hr tablet Take 50 mg by mouth daily. Take with or immediately following a meal.     Milk Thistle 1000 MG CAPS Take 1,000 mg by mouth daily.     Multiple Vitamins-Minerals (MULTIVITAMIN PO) Take 1 tablet by mouth daily.     ondansetron (ZOFRAN ODT) 8 MG disintegrating tablet Take 1 tablet (8 mg total) by mouth every 8 (eight) hours as needed for nausea or vomiting. (Patient not taking: Reported on 05/29/2021) 20 tablet 0   pravastatin (PRAVACHOL) 20 MG tablet Take 20 mg by mouth at bedtime.      traMADol (ULTRAM) 50 MG tablet Take 1 tablet (50 mg total) by mouth every 6 (six) hours as needed. (Patient not taking: Reported on 05/29/2021) 10 tablet 0   Turmeric 500 MG CAPS Take 500 mg by mouth daily.     vitamin C (ASCORBIC ACID) 500 MG tablet Take 500 mg by mouth daily. (Patient not taking: Reported on  05/29/2021)     No current facility-administered medications for this visit.   No Known Allergies   VITALS:  BP 126/72 (BP Location: Right Arm, Patient Position: Sitting, Cuff Size: Normal)   Pulse (!) 59   Temp 98.3 F (36.8 C) (Oral)    ASSESSMENT: Partially edentulous mandible   PROCEDURES: Final impression for Mandibular Removable Partial Denture (acrylic base).  Patient rinsed with 0.12% chlorhexidine gluconate prior to procedure. Customs tray was seated and adjusted as needed.   Edentulous regions were border molded with Heavy Body Genie PVS material. Excess material trimmed away. Reseated custom trays to verify border molding  and proper seating. PVS adhesive was added to the intaglio surfaces of custom trays.   Light Body Genie material was added to the remaining dentition while Medium Body Genie material was added to the custom tray.  Tray was subsequently seated and allowed to set.   Removed custom trays and evaluated for accuracy.  Final impressions appear to have captured all important anatomy and thus deemed adequate. Tooth shade  A4 and Gingival shade  L-199 LRP were picked out today and verified by the patient.  PLAN:  NEXT VISIT:  /P  Wax Try-in  All questions and concerns were invited and addressed.  The patient tolerated today's visit well and departed in stable condition.  -Sandi Mariscal, DMD

## 2022-03-20 ENCOUNTER — Ambulatory Visit (INDEPENDENT_AMBULATORY_CARE_PROVIDER_SITE_OTHER): Payer: Medicare HMO | Admitting: Dentistry

## 2022-03-20 ENCOUNTER — Encounter (HOSPITAL_COMMUNITY): Payer: Self-pay | Admitting: Dentistry

## 2022-03-20 VITALS — BP 127/62 | HR 56 | Temp 98.3°F

## 2022-03-20 DIAGNOSIS — K08109 Complete loss of teeth, unspecified cause, unspecified class: Secondary | ICD-10-CM

## 2022-03-20 NOTE — Progress Notes (Signed)
[facility] Department of Dental Medicine     TODAY'S VISIT:   DENTURES:  JAW RELATIONS     DIAGNOSIS/INDICATIONS: Partially edentulous mandible     PROCEDURES: /P Jaw relations   NEXT VISIT:  /P Wax Try-in    Service Date:   03/20/2022  Patient Name:   Jeffery Wilson Date of Birth:   August 19, 1950 Medical Record Number: [id number]   HISTORY OF PRESENT ILLNESS: Jeffery Wilson presents today for lower RPD jaw relations appointment.  Medical and dental history reviewed with the patient.  No changes were reported.   CHIEF COMPLAINT:   Here for a routine dental appointment.  Patient with no complaints.   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     Coal Tar Extract [phone] PSORIASIS MEDICATED EX) Apply 1 application topically daily.     CVS Omega-3 Krill Oil 350 MG CAPS Take 350 mg by mouth daily.     Echinacea 650 MG CAPS Take 650 mg by mouth daily.     furosemide (LASIX) 40 MG tablet Take 40 mg by mouth daily.       HYDROcodone-acetaminophen (NORCO) 7.5-325 MG tablet Take 1 tablet by mouth every 6 (six) hours as needed for moderate pain. (Patient not taking: Reported on 05/29/2021) 20 tablet 0   hydroxypropyl methylcellulose / hypromellose (ISOPTO TEARS / GONIOVISC) 2.5 % ophthalmic solution Place 1 drop into both eyes 3 (three) times daily as needed for dry eyes.     metoprolol succinate (TOPROL-XL) 50 MG 24 hr tablet Take 50 mg by mouth daily. Take with or immediately following a meal.     Milk Thistle 1000 MG CAPS Take 1,000 mg by mouth daily.     Multiple Vitamins-Minerals (MULTIVITAMIN PO) Take 1 tablet by mouth daily.     ondansetron (ZOFRAN ODT) 8 MG disintegrating tablet Take 1 tablet (8 mg total) by mouth every 8 (eight) hours as needed for nausea or vomiting. (Patient not taking: Reported on 05/29/2021) 20 tablet 0   pravastatin (PRAVACHOL) 20 MG tablet Take 20 mg by mouth at bedtime.      traMADol (ULTRAM) 50 MG tablet Take 1 tablet (50 mg total) by mouth every 6 (six) hours as needed. (Patient not taking: Reported on 05/29/2021) 10 tablet 0   Turmeric 500 MG CAPS Take 500 mg by mouth daily.     vitamin C (ASCORBIC ACID) 500 MG tablet Take 500 mg by mouth daily. (Patient not taking: Reported on 05/29/2021)  No current facility-administered medications for this visit.   No Known Allergies   VITALS:  BP 127/62 (BP Location: Right Arm, Patient Position: Sitting, Cuff Size: Normal)   Pulse (!) 56   Temp 98.3 F (36.8 C) (Oral)    ASSESSMENT: Partially edentulous mandible   PROCEDURES: Jaw relations for mandibular removable partial denture.  Tried in Mandibular record base with occlusal rim. Adjusted mandibular rims until bilateral occlusion was achieved.   Verified even distribution of occlusal forces along edentulous areas.  Adjusted the record bases until the patient was comfortable with fit and speech.   Placed notches in the occlusal rims and recorded bite in CR with Genie Bite  Registration material. Picked out tooth shade and gingival shade at last visit (see previous note).  PLAN:  NEXT VISIT:  /P Wax Try-in  All questions and concerns were invited and addressed.  The patient tolerated today's visit well and departed in stable condition.  -Sandi Mariscal, DMD

## 2022-04-05 ENCOUNTER — Ambulatory Visit (INDEPENDENT_AMBULATORY_CARE_PROVIDER_SITE_OTHER): Payer: Medicare HMO | Admitting: Dentistry

## 2022-04-05 ENCOUNTER — Encounter (HOSPITAL_COMMUNITY): Payer: Self-pay | Admitting: Dentistry

## 2022-04-05 VITALS — BP 123/68 | HR 60 | Temp 98.3°F

## 2022-04-05 DIAGNOSIS — K08109 Complete loss of teeth, unspecified cause, unspecified class: Secondary | ICD-10-CM

## 2022-04-05 NOTE — Progress Notes (Signed)
[facility] Department of Dental Medicine     TODAY'S VISIT:   DENTURES: WAX TRY-IN     DIAGNOSIS/INDICATIONS: Partially edentulous mandible     PROCEDURES: /P wax try-in  NEXT VISIT:  Denture delivery    Service Date:   04/05/2022  Patient Name:   Jeffery Wilson Date of Birth:   1950/12/14 Medical Record Number: [id number]   HISTORY OF PRESENT ILLNESS: Jeffery Wilson presents today for lower removable partial denture wax try-in. Medical and dental history reviewed with the patient.  No changes were reported.   CHIEF COMPLAINT:   Here for a routine dental appointment; patient with no complaints.   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     Coal Tar Extract [phone] PSORIASIS MEDICATED EX) Apply 1 application topically daily.     CVS Omega-3 Krill Oil 350 MG CAPS Take 350 mg by mouth daily.     Echinacea 650 MG CAPS Take 650 mg by mouth daily.     furosemide (LASIX) 40 MG tablet Take 40 mg by mouth daily.       HYDROcodone-acetaminophen (NORCO) 7.5-325 MG tablet Take 1 tablet by mouth every 6 (six) hours as needed for moderate pain. (Patient not taking: Reported on 05/29/2021) 20 tablet 0   hydroxypropyl methylcellulose / hypromellose (ISOPTO TEARS / GONIOVISC) 2.5 % ophthalmic solution Place 1 drop into both eyes 3 (three) times daily as needed for dry eyes.     metoprolol succinate (TOPROL-XL) 50 MG 24 hr tablet Take 50 mg by mouth daily. Take with or immediately following a meal.     Milk Thistle 1000 MG CAPS Take 1,000 mg by mouth daily.     Multiple Vitamins-Minerals (MULTIVITAMIN PO) Take 1 tablet by mouth daily.     ondansetron (ZOFRAN ODT) 8 MG disintegrating tablet Take 1 tablet (8 mg total) by mouth every 8 (eight) hours as needed for nausea or vomiting. (Patient not taking: Reported on 05/29/2021) 20 tablet 0   pravastatin (PRAVACHOL) 20 MG tablet Take 20 mg by mouth at bedtime.      traMADol (ULTRAM) 50 MG tablet Take 1 tablet (50 mg total) by mouth every 6 (six) hours as needed. (Patient not taking: Reported on 05/29/2021) 10 tablet 0   Turmeric 500 MG CAPS Take 500 mg by mouth daily.     vitamin C (ASCORBIC ACID) 500 MG tablet Take 500 mg by mouth daily. (Patient not taking: Reported on 05/29/2021)     No  current facility-administered medications for this visit.   No Known Allergies   VITALS:  BP 123/68 (BP Location: Right Arm, Patient Position: Sitting, Cuff Size: Normal)   Pulse 60   Temp 98.3 F (36.8 C) (Oral)    ASSESSMENT: Partially edentulous mandible   PROCEDURES: Wax-try in for mandibular partial denture denture. Tried in mandibular wax try-in denture. Verified plane of occlusion.  No rocking of the wax dentures noted.   Guided the patient into CR and no interferences noted.  Bilateral balanced occlusion verified with Accufilm. Patient was given a mirror for input on overall appearance and fit of denture try-in.  The patient is satisfied with the denture(s) teeth shape,  size, color/shade, and overall appearance and fit of the the wax try-in dentures. Gingival shade L-199 LRP was picked out and verified by the patient (at previous visit).   PLAN:  NEXT VISIT:  /P  delivery  All questions and concerns were invited and addressed.  The patient tolerated today's visit well and departed in stable condition.  -Sandi Mariscal, DMD

## 2022-04-19 ENCOUNTER — Ambulatory Visit (INDEPENDENT_AMBULATORY_CARE_PROVIDER_SITE_OTHER): Payer: Medicare HMO | Admitting: Dentistry

## 2022-04-19 VITALS — BP 132/63 | HR 59 | Temp 98.5°F

## 2022-04-19 DIAGNOSIS — K08109 Complete loss of teeth, unspecified cause, unspecified class: Secondary | ICD-10-CM | POA: Diagnosis not present

## 2022-04-19 DIAGNOSIS — Z463 Encounter for fitting and adjustment of dental prosthetic device: Secondary | ICD-10-CM | POA: Diagnosis not present

## 2022-04-19 NOTE — Patient Instructions (Signed)
[facility] Department of Dental Medicine Dr. Debe Coder B. Benson Norway, D.M.D. Phone: [phone] Fax: [phone]    Congratulations, you are on the way to oral rehabilitation!  You have just received a new set of complete or partial dentures.  Dentures give you many of the benefits of a full set of teeth.  They help restore your ability to bite and chew food.  They can help you speak more clearly.  And having dentures can improve the shape of your jaw line and give you a natural smile.    Getting used to dentures can take a while.  At first, you will be aware of a new feeling in your mouth.  Biting and chewing on your food and even talking may seem a little different.  But soon they will feel like a natural part of your mouth.  These instructions will help you get adjusted to your dentures as well as care for them properly.  Please read these instructions carefully and completely as soon as you get home.  If you or your caregiver have any questions please call the [facility] at [phone].    INSTRUCTIONS FOR DENTURE USE AND CARE   How Will Your Dentures Look and Feel? Soon after you begin wearing your dentures, you may feel that your dentures are too large or even loose.  As our mouth and facial muscles become accustomed to the dentures, these feelings will go away.   You also may feel that you are salivating more than you normally do.  This feeling should go away as you get used to having the dentures in your mouth.   You may bite your cheek or your tongue; this will eventually resolve itself as you wear your dentures.  Some soreness is to be expected, but you should not hurt.  If your mouth hurts, call your dentist. A denture adhesive may occasionally be necessary to hold your dentures in place more securely.  The dentist will let you know when one is recommended for you.  SPEAKING:  Wearing dentures will change the sound of your voice  initially.  This will be noticed by you more than anyone else.   Bite and swallow before you speak, in order to place your dentures in position so that you may speak more clearly.   Practice speaking by reading aloud or counting from 1 to 100 very slowly and distinctly.   After some practice, your mouth will become accustomed to your dentures and you will speak more clearly.  EATING:  Chewing will definitely feel different after you receive your dentures.  With a little practice and patience, you should be able to eat just about any kind of food.   Begin by eating small quantities of food that are cut into small pieces.  Start with soft foods such as eggs, cooked vegetables, or puddings.   As you gain confidence, try more foods.   How Do You Care for Your Dentures?   Dentures can collect plaque and calculus much the same as natural teeth can.  If not removed on a regular basis, your dentures will not look or feel clean, and you will experience denture odor.  It is very important that you remove your dentures at bedtime and clean them thoroughly.    YOU SHOULD: Clean your dentures over a sink full of water so if dropped, they will not break.  You can also stand over a folded towel. Rinse your dentures with  cool water to remove any large food particles. Use soap and water or a denture cleanser or paste to clean the dentures.  Do not use regular toothpaste as it may abrade the denture base or teeth. Use a moistened denture brush to clean all surfaces (inside and outside). Rinse thoroughly to remove any remaining soap or denture cleanser. Use a soft bristle toothbrush to gently brush any natural teeth, gums, tongue, and palate at bedtime and before reinserting your dentures. Do not sleep with your dentures in your mouth at night.  Remove your dentures and soak them overnight in a denture cup filled with water or denture solution as recommended by your dentist.  This routine will become second nature  and will increase the life and comfort of your dentures.     When Should You Call for Help?  Watch closely for changes in your health, and be sure to contact your dentist if: Your dentures do not fit well. You have sores in your mouth. Your dentures cause pain.   FOLLOW-UP:  Your dentist will adjust your dentures from time to time to help them continue to fit well and to see you at least once a year for a check-up and examination. Please do not try to adjust your dentures yourself; you could damage them.

## 2022-04-19 NOTE — Progress Notes (Signed)
[facility] Department of Dental Medicine      TODAY'S VISIT    DENTURES: DELIVERY   DIAGNOSIS: Partially edentulous mandible PROCEDURES: /P delivery PLAN:  NEXT VISIT:  Denture adjustment    Service Date:   04/19/2022  Patient Name:   Jeffery Wilson Date of Birth:   [birth year] Medical Record Number: [id number]   HISTORY OF PRESENT ILLNESS: Jeffery Wilson presents today for lower partial denture delivery.  Medical and dental history reviewed with the patient.  No changes were reported.   CHIEF COMPLAINT:   Here for a routine dental appointment; patient with no complaints.   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     Coal Tar Extract [phone] PSORIASIS MEDICATED EX) Apply 1 application topically daily.     CVS Omega-3 Krill Oil 350 MG CAPS Take 350 mg by mouth daily.     Echinacea 650 MG CAPS Take 650 mg by mouth daily.     furosemide (LASIX) 40 MG tablet Take 40 mg by mouth daily.       HYDROcodone-acetaminophen (NORCO) 7.5-325 MG tablet Take 1 tablet by mouth every 6 (six) hours as needed for moderate pain. (Patient not taking: Reported on 05/29/2021) 20 tablet 0   hydroxypropyl methylcellulose / hypromellose (ISOPTO TEARS / GONIOVISC) 2.5 % ophthalmic solution Place 1 drop into both eyes 3 (three) times daily as needed for dry eyes.     metoprolol succinate (TOPROL-XL) 50 MG 24 hr tablet Take 50 mg by mouth daily. Take with or immediately following a meal.     Milk Thistle 1000 MG CAPS Take 1,000 mg by mouth daily.     Multiple Vitamins-Minerals (MULTIVITAMIN PO) Take 1 tablet by mouth daily.     ondansetron (ZOFRAN ODT) 8 MG disintegrating tablet Take 1 tablet (8 mg total) by mouth every 8 (eight) hours as needed for nausea or vomiting. (Patient not taking: Reported on 05/29/2021) 20 tablet 0   pravastatin (PRAVACHOL) 20 MG tablet Take 20 mg by mouth at bedtime.      traMADol (ULTRAM) 50 MG tablet Take 1 tablet (50 mg total) by mouth every 6 (six) hours as needed. (Patient not taking: Reported on 05/29/2021) 10 tablet 0   Turmeric 500 MG CAPS Take 500 mg by mouth daily.     vitamin C (ASCORBIC ACID) 500 MG tablet Take 500 mg by mouth daily. (Patient not taking: Reported on 05/29/2021)     No current facility-administered medications for this visit.  No Known Allergies   VITALS: BP 132/63 (BP Location: Right Arm, Patient Position: Sitting, Cuff Size: Normal)   Pulse (!) 59   Temp 98.5 F (36.9 C) (Oral)    ASSESSMENT: Partially edentulous mandible    PROCEDURES: Mandibular removable partial denture delivery.   Mandibular partial denture seated.  No rocking noted.  Applied PIP paste and seated mandibular denture.  Noted heavy spots and adjusted with acrylic bur as needed.  Guided patient's mandible into CR and verified occlusion using Accufilm.  Adjusted occlusion as needed until bilateral balanced occlusion achieved.  Polished areas where adjustments were made.  Good  esthetics, phonetics, overall retention and function noted. Had patient demonstrate placing and removing dentures.  Discussed home care with the patient including brushing with dish soap with denture toothbrush in the evenings and storing in a bowl of water at night.  Restrictions and limitations of dentures reviewed with the patient in verbal and written format.  Patient verbalized understanding of instructions and is satisfied with overall appearance and fit of dentures.   Denture toothbrush and container given to patient.   PLAN: Follow-up in 1 week for denture adjustment. Call should any questions or concerns arise before next visit.  All questions and concerns were invited and addressed.  The patient tolerated today's visit well and departed in stable condition.  - Sandi Mariscal, DMD

## 2022-05-03 ENCOUNTER — Ambulatory Visit (INDEPENDENT_AMBULATORY_CARE_PROVIDER_SITE_OTHER): Payer: Medicare HMO | Admitting: Dentistry

## 2022-05-03 VITALS — BP 130/70 | HR 70 | Temp 98.2°F

## 2022-05-03 DIAGNOSIS — K08199 Complete loss of teeth due to other specified cause, unspecified class: Secondary | ICD-10-CM

## 2022-05-03 NOTE — Progress Notes (Unsigned)
[facility]      TODAY'S VISIT:    DENTURE ADJUSTMENT   PROCEDURES: C/C *** denture adjustment PLAN: Call if any questions or concerns arise before next visit.  NEXT VISIT:  Denture adjustment as needed *** recall ***    Service Date:   05/03/2022  Patient Name:   Jeffery Wilson Date of Birth:   12-31-1950 Medical Record Number: [id number]   HISTORY OF PRESENT ILLNESS: Jeffery Wilson presents today for *** denture adjustment.  Delivery of denture *** was on *** .   Medical and dental history reviewed with the patient.  No changes were reported.   CHIEF COMPLAINT:   The patient reports *** .   Patient Active Problem List   Diagnosis Date Noted   Encounter for fitting and adjustment of dental prosthetic device 04/19/2022   Loss of teeth due to extraction 02/06/2022   Encounter for dental examination and cleaning without abnormal findings 12/07/2021   History of head and neck radiation 12/07/2021   Accretions on teeth 12/07/2021   Defective dental restoration 12/07/2021   Attrition, teeth excessive 12/07/2021   Gingival recession, generalized 12/07/2021   Teeth missing 12/07/2021   Loose, teeth 12/07/2021   Cracked tooth 08/08/2021   Chronic periodontitis 08/08/2021   Caries 05/08/2021   Abfraction 96/78/9381   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     Coal Tar Extract [phone] PSORIASIS MEDICATED EX) Apply 1 application topically daily.     [employer] Omega-3 Krill Oil 350 MG CAPS Take 350 mg by mouth daily.     Echinacea 650  MG CAPS Take 650 mg by mouth daily.     furosemide (LASIX) 40 MG tablet Take 40 mg by mouth daily.      HYDROcodone-acetaminophen (NORCO) 7.5-325 MG tablet Take 1 tablet by mouth every 6 (six) hours as needed for moderate pain. (Patient not taking: Reported on 05/29/2021) 20 tablet 0   hydroxypropyl methylcellulose / hypromellose (ISOPTO TEARS / GONIOVISC) 2.5 % ophthalmic solution Place 1 drop into both eyes 3 (three) times daily as needed for dry eyes.     metoprolol succinate (TOPROL-XL) 50 MG 24 hr tablet Take 50 mg by mouth daily. Take with or immediately following a meal.     Milk Thistle 1000 MG CAPS Take 1,000 mg by mouth daily.     Multiple Vitamins-Minerals (MULTIVITAMIN PO) Take 1 tablet by mouth daily.     ondansetron (ZOFRAN ODT) 8 MG disintegrating tablet Take 1 tablet (8 mg total) by mouth every 8 (eight) hours as needed for nausea or vomiting. (Patient not taking: Reported on 05/29/2021) 20 tablet 0   pravastatin (PRAVACHOL) 20 MG tablet Take 20 mg by mouth at bedtime.      traMADol (ULTRAM) 50 MG tablet Take 1 tablet (50 mg total) by mouth every 6 (six) hours as needed. (Patient not taking: Reported on 05/29/2021) 10 tablet 0   Turmeric 500 MG CAPS Take 500 mg by mouth daily.     vitamin  C (ASCORBIC ACID) 500 MG tablet Take 500 mg by mouth daily. (Patient not taking: Reported on 05/29/2021)     No current facility-administered medications for this visit.   No Known Allergies   VITALS: BP 130/70 (BP Location: Right Arm, Patient Position: Sitting, Cuff Size: Normal)   Pulse 70   Temp 98.2 F (36.8 C) (Oral)    ASSESSMENT: Partially / Completely edentulous ***    PROCEDURES: C/C *** denture adjustment.  Maxillary and mandibular *** dentures seated *** .   Applied PIP paste and seated maxillary and mandibular dentures.  Noted heavy spots and adjusted with acrylic bur as needed.  Guided patient's mandible into CR and verified occlusion using Accufilm.  Adjusted occlusion as  needed until bilateral balanced occlusion achieved.  Polished areas where adjustments were made.  Good esthetics, phonetics, overall retention and function noted.   PLAN: Follow-up as needed for denture adjustment. 6 month recall *** 1 year edentulous check *** Call should any questions or concerns arise before next visit.  All questions and concerns were invited and addressed.  The patient tolerated today's visit well and departed in stable condition.  - Sandi Mariscal, DMD

## 2022-05-18 ENCOUNTER — Ambulatory Visit
Admission: EM | Admit: 2022-05-18 | Discharge: 2022-05-18 | Disposition: A | Discharge status: Home / Self Care | Payer: Medicare HMO | Attending: Urgent Care | Admitting: Urgent Care

## 2022-05-18 DIAGNOSIS — Z79899 Other long term (current) drug therapy: Secondary | ICD-10-CM | POA: Insufficient documentation

## 2022-05-18 DIAGNOSIS — B349 Viral infection, unspecified: Secondary | ICD-10-CM

## 2022-05-18 DIAGNOSIS — Z1152 Encounter for screening for COVID-19: Secondary | ICD-10-CM | POA: Diagnosis not present

## 2022-05-18 DIAGNOSIS — Z87891 Personal history of nicotine dependence: Secondary | ICD-10-CM

## 2022-05-18 DIAGNOSIS — N183 Chronic kidney disease, stage 3 unspecified: Secondary | ICD-10-CM

## 2022-05-18 DIAGNOSIS — Z8572 Personal history of non-Hodgkin lymphomas: Secondary | ICD-10-CM

## 2022-05-18 DIAGNOSIS — Z7952 Long term (current) use of systemic steroids: Secondary | ICD-10-CM | POA: Insufficient documentation

## 2022-05-18 LAB — SARS CORONAVIRUS 2 (TAT 6-24 HRS): SARS Coronavirus 2: NEGATIVE

## 2022-05-18 MED ORDER — PREDNISONE 10 MG PO TABS: 30.0000 mg | ORAL_TABLET | Freq: Every day | ORAL | 0 refills | Status: AC

## 2022-05-18 MED ORDER — PROMETHAZINE-DM 6.25-15 MG/5ML PO SYRP: 2.5000 mL | ORAL_SOLUTION | Freq: Three times a day (TID) | ORAL | 0 refills | Status: AC | PRN

## 2022-05-18 MED ORDER — CETIRIZINE HCL 10 MG PO TABS: 10.0000 mg | ORAL_TABLET | Freq: Every day | ORAL | 0 refills | Status: AC

## 2022-05-18 NOTE — ED Provider Notes (Signed)
[facility]  Note:  This document was prepared using Systems analyst and may include unintentional dictation errors.  MRN: [id number] DOB: [birth year]  Subjective:   Jeffery Wilson is a 71 y.o. male presenting for 4 day history of acute onset sinus congestion, throat pain, productive cough. No chest pain, shortness of breath, wheezing.  No history of respiratory disorders.  Patient is a former smoker, quit about 4 to 5 years ago.  Has a history of lymphoma and has undergone treatment for this over the past 4 years, and is currently in remission and gets very close follow-up.  No current facility-administered medications for this encounter.  Current Outpatient Medications:    acetaminophen (TYLENOL) 500 MG tablet, Take 1,000 mg by mouth at bedtime., Disp: , Rfl:    Coal Tar Extract [phone] PSORIASIS MEDICATED EX), Apply 1 application topically daily., Disp: , Rfl:    CVS Omega-3 Krill Oil 350 MG CAPS, Take 350 mg by mouth daily., Disp: , Rfl:    Echinacea 650 MG CAPS, Take 650 mg by mouth daily., Disp: , Rfl:    furosemide (LASIX) 40 MG tablet, Take 40 mg by mouth daily. , Disp: , Rfl:    HYDROcodone-acetaminophen (NORCO) 7.5-325 MG tablet, Take 1 tablet by mouth every 6 (six) hours as needed for moderate pain. (Patient not taking: Reported on 05/29/2021), Disp: 20 tablet, Rfl: 0   hydroxypropyl methylcellulose / hypromellose (ISOPTO TEARS / GONIOVISC) 2.5 % ophthalmic solution, Place 1 drop into both eyes 3 (three) times daily as needed for dry eyes., Disp: , Rfl:    metoprolol succinate (TOPROL-XL) 50 MG 24 hr tablet, Take 50 mg by mouth daily. Take with or immediately following a meal., Disp: , Rfl:    Milk Thistle 1000 MG CAPS, Take 1,000 mg by mouth daily., Disp: , Rfl:    Multiple Vitamins-Minerals (MULTIVITAMIN PO), Take 1 tablet by mouth daily., Disp: , Rfl:    ondansetron (ZOFRAN ODT) 8 MG disintegrating tablet, Take 1 tablet (8 mg total) by  mouth every 8 (eight) hours as needed for nausea or vomiting. (Patient not taking: Reported on 05/29/2021), Disp: 20 tablet, Rfl: 0   pravastatin (PRAVACHOL) 20 MG tablet, Take 20 mg by mouth at bedtime. , Disp: , Rfl:    traMADol (ULTRAM) 50 MG tablet, Take 1 tablet (50 mg total) by mouth every 6 (six) hours as needed. (Patient not taking: Reported on 05/29/2021), Disp: 10 tablet, Rfl: 0   Turmeric 500 MG CAPS, Take 500 mg by mouth daily., Disp: , Rfl:    vitamin C (ASCORBIC ACID) 500 MG tablet, Take 500 mg by mouth daily. (Patient not taking: Reported on 05/29/2021), Disp: , Rfl:    No Known Allergies  Past Medical History:  Diagnosis Date   Cancer ([place])    Hypertension    Lymphoma ([place])    Pre-diabetes    Renal disorder    pt says stage 3 kidney dz      Past Surgical History:  Procedure Laterality Date   HAMMER TOE SURGERY     KNEE SURGERY     PAROTIDECTOMY Left 03/07/2021   Procedure: LEFT PAROTIDECTOMY WITH FACIAL NERVE DISSECTION;  Surgeon: [name], MD;  Location: [facility];  Service: ENT;  Laterality: Left;    No family history on file.  Social History   Tobacco Use   Smoking status: Former    Types: Cigarettes   Smokeless tobacco: Never   Tobacco comments:  1 cigarette per day  Vaping Use   Vaping Use: Never used  Substance Use Topics   Alcohol use: Yes    Comment: occ   Drug use: No    ROS   Objective:   Vitals: BP 131/72 (BP Location: Right Arm)   Pulse 79   Temp 98 F (36.7 C) (Oral)   Resp 16   SpO2 96%   Physical Exam Constitutional:      General: He is not in acute distress.    Appearance: Normal appearance. He is well-developed and normal weight. He is not ill-appearing, toxic-appearing or diaphoretic.  HENT:     Head: Normocephalic and atraumatic.     Right Ear: Tympanic membrane, ear canal and external ear normal. No drainage, swelling or tenderness. No middle ear effusion. There is no impacted cerumen. Tympanic membrane is not  erythematous or bulging.     Left Ear: Tympanic membrane, ear canal and external ear normal. No drainage, swelling or tenderness.  No middle ear effusion. There is no impacted cerumen. Tympanic membrane is not erythematous or bulging.     Nose: Nose normal. No congestion or rhinorrhea.     Mouth/Throat:     Mouth: Mucous membranes are moist.     Pharynx: No oropharyngeal exudate or posterior oropharyngeal erythema.  Eyes:     General: No scleral icterus.       Right eye: No discharge.        Left eye: No discharge.     Extraocular Movements: Extraocular movements intact.     Conjunctiva/sclera: Conjunctivae normal.  Cardiovascular:     Rate and Rhythm: Normal rate and regular rhythm.     Heart sounds: Normal heart sounds. No murmur heard.    No friction rub. No gallop.  Pulmonary:     Effort: Pulmonary effort is normal. No respiratory distress.     Breath sounds: Normal breath sounds. No stridor. No wheezing, rhonchi or rales.  Musculoskeletal:     Cervical back: Normal range of motion and neck supple. No rigidity. No muscular tenderness.  Neurological:     General: No focal deficit present.     Mental Status: He is alert and oriented to person, place, and time.  Psychiatric:        Mood and Affect: Mood normal.        Behavior: Behavior normal.        Thought Content: Thought content normal.     Assessment and Plan :   PDMP not reviewed this encounter.  1. Acute viral syndrome   2. Former smoker   3. Stage 3 chronic kidney disease, unspecified whether stage 3a or 3b CKD ([place])   4. History of lymphoma     Recommended an oral prednisone course given the severity of his symptoms and history of smoking.  Creatinine clearance calculated at 61 mL/min. Deferred imaging given clear cardiopulmonary exam, hemodynamically stable vital signs.   Patient would be a good candidate to undergo molnupiravir should he test positive for COVID-19.  Counseled patient on potential for adverse  effects with medications prescribed/recommended today, ER and return-to-clinic precautions discussed, patient verbalized understanding.    Jaynee Eagles, PA-C 05/18/22 1347

## 2022-05-18 NOTE — ED Triage Notes (Signed)
Pt c/o nasal congestion, sore throat, prod cough x 4 days-last dose tylenol ~9am-NAD-steady gait

## 2022-05-18 NOTE — Discharge Instructions (Addendum)
We will notify you of your test results as they arrive and may take between about 24 hours.  I encourage you to sign up for MyChart if you have not already done so as this can be the easiest way for [place] to communicate results to you online or through a phone app.  Generally, we only contact you if it is a positive test result.  In the meantime, if you develop worsening symptoms including fever, chest pain, shortness of breath despite our current treatment plan then please report to the emergency room as this may be a sign of worsening status from possible viral infection.  Otherwise, we will manage this as a viral syndrome. For sore throat or cough try using a honey-based tea. Use 3 teaspoons of honey with juice squeezed from half lemon. Place shaved pieces of ginger into 1/2-1 cup of water and warm over stove top. Then mix the ingredients and repeat every 4 hours as needed. Please take Tylenol '500mg'$ -'650mg'$  every 6 hours for aches and pains, fevers. Hydrate very well with at least 2 liters of water. Eat light meals such as soups to replenish electrolytes and soft fruits, veggies. Start an antihistamine like Zyrtec for postnasal drainage, sinus congestion.  You can take this together with prednisone.  Use the cough medications as needed.   We will be using molnupiravir if you test positive for COVID 19.

## 2022-05-22 ENCOUNTER — Encounter (HOSPITAL_COMMUNITY): Payer: Medicare HMO | Admitting: Dentistry

## 2022-05-28 ENCOUNTER — Inpatient Hospital Stay: Payer: Medicare HMO | Admitting: Hematology

## 2022-05-28 ENCOUNTER — Other Ambulatory Visit: Payer: Self-pay

## 2022-05-28 ENCOUNTER — Inpatient Hospital Stay: Payer: Medicare HMO | Attending: Hematology

## 2022-05-28 VITALS — BP 138/80 | HR 63 | Temp 98.0°F | Resp 19 | Ht 71.0 in | Wt 256.1 lb

## 2022-05-28 DIAGNOSIS — Z8572 Personal history of non-Hodgkin lymphomas: Secondary | ICD-10-CM | POA: Diagnosis present

## 2022-05-28 DIAGNOSIS — C8211 Follicular lymphoma grade II, lymph nodes of head, face, and neck: Secondary | ICD-10-CM

## 2022-05-28 LAB — CBC WITH DIFFERENTIAL/PLATELET
Abs Immature Granulocytes: 0.1 10*3/uL — ABNORMAL HIGH (ref 0.00–0.07)
Basophils Absolute: 0.1 10*3/uL (ref 0.0–0.1)
Basophils Relative: 1 %
Eosinophils Absolute: 0.2 10*3/uL (ref 0.0–0.5)
Eosinophils Relative: 3 %
HCT: 46.6 % (ref 39.0–52.0)
Hemoglobin: 15.6 g/dL (ref 13.0–17.0)
Immature Granulocytes: 1 %
Lymphocytes Relative: 16 %
Lymphs Abs: 1.1 10*3/uL (ref 0.7–4.0)
MCH: 33.4 pg (ref 26.0–34.0)
MCHC: 33.5 g/dL (ref 30.0–36.0)
MCV: 99.8 fL (ref 80.0–100.0)
Monocytes Absolute: 0.6 10*3/uL (ref 0.1–1.0)
Monocytes Relative: 9 %
Neutro Abs: 5.1 10*3/uL (ref 1.7–7.7)
Neutrophils Relative %: 70 %
Platelets: 232 10*3/uL (ref 150–400)
RBC: 4.67 MIL/uL (ref 4.22–5.81)
RDW: 12.8 % (ref 11.5–15.5)
WBC: 7.2 10*3/uL (ref 4.0–10.5)
nRBC: 0 % (ref 0.0–0.2)

## 2022-05-28 LAB — CMP (CANCER CENTER ONLY)
ALT: 37 U/L (ref 0–44)
AST: 28 U/L (ref 15–41)
Albumin: 4.5 g/dL (ref 3.5–5.0)
Alkaline Phosphatase: 56 U/L (ref 38–126)
Anion gap: 7 (ref 5–15)
BUN: 25 mg/dL — ABNORMAL HIGH (ref 8–23)
CO2: 33 mmol/L — ABNORMAL HIGH (ref 22–32)
Calcium: 10.5 mg/dL — ABNORMAL HIGH (ref 8.9–10.3)
Chloride: 100 mmol/L (ref 98–111)
Creatinine: 1.61 mg/dL — ABNORMAL HIGH (ref 0.61–1.24)
GFR, Estimated: 45 mL/min — ABNORMAL LOW (ref >60)
Glucose, Bld: 99 mg/dL (ref 70–99)
Potassium: 4.5 mmol/L (ref 3.5–5.1)
Sodium: 140 mmol/L (ref 135–145)
Total Bilirubin: 0.7 mg/dL (ref 0.3–1.2)
Total Protein: 7.7 g/dL (ref 6.5–8.1)

## 2022-05-28 LAB — LACTATE DEHYDROGENASE: LDH: 144 U/L (ref 98–192)

## 2022-05-28 NOTE — Progress Notes (Signed)
HEMATOLOGY/ONCOLOGY CLINIC NOTE  Date of Service: 05/28/22    Patient Care Team: [name], MD as PCP - General (Family Medicine) Malmfelt, [name], RN as Oncology Nurse Navigator [name], Cloria Spring, MD as Consulting Physician (Hematology) [name], MD as Consulting Physician (Radiation Oncology)  CHIEF COMPLAINTS/PURPOSE OF CONSULTATION:  Follow-up for follicular lymphoma  HISTORY OF PRESENTING ILLNESS:  See previous note for detail  Interval History:  [name] is a 70 y/o male here for continued evaluation and management of his follicular lymphoma.   Patient was last seen by me on 11/27/21 and was doing well overall without any new medical concerns.  Today, he states that he is doing well overall. He recently went to the ED for persistent dry cough and fever symptoms in November. During this visit, he tested negative for COVID-19 and influenza. He was diagnosed with viral infection, which was treated with Prednisone and denies antibiotics.   Patient still complains of dry cough during today's visit. He denies phlegm and sinus drainage. His symptoms are exacerbated when lying down. Otherwise, his other symptoms are improving. He complains of mild abdominal soreness due to cough.   Patient denies fever, chills, night sweats, new lumps/bumps, abdominal pain, back pain, loss of appetite, abnormal bowl moment, unexpected weight loss, testicular pain/swelling, or leg swelling. He has been eating well and his weight loss has been stable.  Patient has received influenza vaccine, COVID-19 Booster, RSV vaccine, and shinglex vaccine.   He started Lisinopril about 6 weeks ago for blood pressure. He follows-up with his PCP every 6 months.   MEDICAL HISTORY:  Past Medical History:  Diagnosis Date   Cancer ([place])    Hypertension    Lymphoma ([place])    Pre-diabetes    Renal disorder    pt says stage 3 kidney dz     SURGICAL HISTORY: Past Surgical History:   Procedure Laterality Date   HAMMER TOE SURGERY     KNEE SURGERY     PAROTIDECTOMY Left 03/07/2021   Procedure: LEFT PAROTIDECTOMY WITH FACIAL NERVE DISSECTION;  Surgeon: [name], MD;  [facility] OR;  Service: ENT;  Laterality: Left;    SOCIAL HISTORY: Social History   Socioeconomic History   Marital status: Married    Spouse name: Not on file   Number of children: Not on file   Years of education: Not on file   Highest education level: Not on file  Occupational History   Not on file  Tobacco Use   Smoking status: Former    Types: Cigarettes   Smokeless tobacco: Never   Tobacco comments:    1 cigarette per day  Vaping Use   Vaping Use: Never used  Substance and Sexual Activity   Alcohol use: Yes    Comment: occ   Drug use: No   Sexual activity: Not on file  Other Topics Concern   Not on file  Social History Narrative   Not on file   Social Determinants of Health   Financial Resource Strain: Not on file  Food Insecurity: Not on file  Transportation Needs: Not on file  Physical Activity: Not on file  Stress: Not on file  Social Connections: Not on file  Intimate Partner Violence: Not on file    FAMILY HISTORY: No family history on file.  ALLERGIES:  has No Known Allergies.  MEDICATIONS:  Current Outpatient Medications  Medication Sig Dispense Refill   acetaminophen (TYLENOL) 500 MG tablet Take 1,000 mg by  mouth at bedtime.     cetirizine (ZYRTEC ALLERGY) 10 MG tablet Take 1 tablet (10 mg total) by mouth daily. 30 tablet 0   Coal Tar Extract [phone] PSORIASIS MEDICATED EX) Apply 1 application topically daily.     CVS Omega-3 Krill Oil 350 MG CAPS Take 350 mg by mouth daily.     Echinacea 650 MG CAPS Take 650 mg by mouth daily.     furosemide (LASIX) 40 MG tablet Take 40 mg by mouth daily.      HYDROcodone-acetaminophen (NORCO) 7.5-325 MG tablet Take 1 tablet by mouth every 6 (six) hours as needed for moderate pain. (Patient not taking: Reported on  05/29/2021) 20 tablet 0   hydroxypropyl methylcellulose / hypromellose (ISOPTO TEARS / GONIOVISC) 2.5 % ophthalmic solution Place 1 drop into both eyes 3 (three) times daily as needed for dry eyes.     metoprolol succinate (TOPROL-XL) 50 MG 24 hr tablet Take 50 mg by mouth daily. Take with or immediately following a meal.     Milk Thistle 1000 MG CAPS Take 1,000 mg by mouth daily.     Multiple Vitamins-Minerals (MULTIVITAMIN PO) Take 1 tablet by mouth daily.     ondansetron (ZOFRAN ODT) 8 MG disintegrating tablet Take 1 tablet (8 mg total) by mouth every 8 (eight) hours as needed for nausea or vomiting. (Patient not taking: Reported on 05/29/2021) 20 tablet 0   pravastatin (PRAVACHOL) 20 MG tablet Take 20 mg by mouth at bedtime.      predniSONE (DELTASONE) 10 MG tablet Take 3 tablets (30 mg total) by mouth daily with breakfast. 15 tablet 0   promethazine-dextromethorphan (PROMETHAZINE-DM) 6.25-15 MG/5ML syrup Take 2.5 mLs by mouth 3 (three) times daily as needed for cough. 100 mL 0   traMADol (ULTRAM) 50 MG tablet Take 1 tablet (50 mg total) by mouth every 6 (six) hours as needed. (Patient not taking: Reported on 05/29/2021) 10 tablet 0   Turmeric 500 MG CAPS Take 500 mg by mouth daily.     vitamin C (ASCORBIC ACID) 500 MG tablet Take 500 mg by mouth daily. (Patient not taking: Reported on 05/29/2021)     No current facility-administered medications for this visit.    REVIEW OF SYSTEMS:   10 Point review of Systems was done is negative except as noted above.   PHYSICAL EXAMINATION: ECOG PERFORMANCE STATUS: 1 - Symptomatic but completely ambulatory  Vitals:   05/28/22 1007  BP: 138/80  Pulse: 63  Resp: 19  Temp: 98 F (36.7 C)  SpO2: 99%    Filed Weights   05/28/22 1007  Weight: 256 lb 1.6 oz (116.2 kg)    .Body mass index is 35.72 kg/m.  Marland Kitchen NAD GENERAL:alert, in no acute distress and comfortable SKIN: no acute rashes, no significant lesions EYES: conjunctiva are pink and  non-injected, sclera anicteric OROPHARYNX: MMM, no exudates, no oropharyngeal erythema or ulceration NECK: supple, no JVD LYMPH:  no palpable lymphadenopathy in the cervical, axillary or inguinal regions LUNGS: clear to auscultation b/l with normal respiratory effort HEART: regular rate & rhythm ABDOMEN:  normoactive bowel sounds , non tender, not distended. Extremity: no pedal edema PSYCH: alert & oriented x 3 with fluent speech NEURO: no focal motor/sensory deficits  LABORATORY DATA:  I have reviewed the data as listed  .    Latest Ref Rng & Units 05/28/2022    9:49 AM 11/27/2021    9:40 AM 05/29/2021    8:38 AM  CBC  WBC 4.0 - 10.5  K/uL 7.2  5.0  4.2   Hemoglobin 13.0 - 17.0 g/dL 15.6  15.5  15.4   Hematocrit 39.0 - 52.0 % 46.6  45.5  45.4   Platelets 150 - 400 K/uL 232  171  180     .    Latest Ref Rng & Units 05/28/2022    9:49 AM 11/27/2021    9:40 AM 05/29/2021    8:38 AM  CMP  Glucose 70 - 99 mg/dL 99  123  87   BUN 8 - 23 mg/dL '25  21  22   '$ Creatinine 0.61 - 1.24 mg/dL 1.61  1.44  1.60   Sodium 135 - 145 mmol/L 140  138  140   Potassium 3.5 - 5.1 mmol/L 4.5  3.9  3.8   Chloride 98 - 111 mmol/L 100  103  104   CO2 22 - 32 mmol/L 33  26  24   Calcium 8.9 - 10.3 mg/dL 10.5  9.8  9.3   Total Protein 6.5 - 8.1 g/dL 7.7  7.4  7.5   Total Bilirubin 0.3 - 1.2 mg/dL 0.7  0.7  0.8   Alkaline Phos 38 - 126 U/L 56  57  73   AST 15 - 41 U/L '28  22  21   '$ ALT 0 - 44 U/L 37  20  15    . Lab Results  Component Value Date   LDH 144 05/28/2022    Component     Latest Ref Rng & Units 03/24/2018  Hepatitis B Surface Ag     Negative Negative  Hep B Core Ab, Tot     Negative Negative  HIV Screen 4th Generation wRfx     Non Reactive Non Reactive  HCV Ab     0.0 - 0.9 s/co ratio <0.1  LDH     98 - 192 U/L 139   01/28/18 Submandibular gland biopsy:    RADIOGRAPHIC STUDIES: I have personally reviewed the radiological images as listed and agreed with the findings in the  report. No results found.  ASSESSMENT & PLAN:   71 y.o. male with  1. 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 Additional nonenlarged lymph nodes in the left neck shows low level FDG accumulation, suspicious for tumor involvement. 2. Tiny right groin lymph node shows low level FDG uptake, indeterminate. Otherwise, no evidence for hypermetabolic disease in the chest, abdomen, or pelvis. 3. Hepatic steatosis. 4. Cholelithiasis. 5. Colonic diverticulosis without diverticulitis.   04/24/18 Left cervical lymph node incisional biopsy revealed Follicular lymphoma, Grade 1-2 with a 10% proliferation rate   S/p 4 weekly cycles of Rituxan (limited rate) completed on 06/09/18  08/04/18 PET/CT revealed Response to therapy, as evidenced by mildly decreased size and hypermetabolism within a dominant left submandibular mass. The previously described left-sided cervical nodal hypermetabolism has resolved. 2. Decrease in right inguinal nodal hypermetabolism, favored to be physiologic or reactive. 3. No new or progressive disease. 4. Incidental findings, including cholelithiasis, left adrenal adenoma.  PLAN: -Patient's labs today were discussed with the patient and his wife in  details. CBC stable CMP shows creatine of 1.61, otherwise stable LDH within normal limit -Patient has no lab evidence or clinical signs or symptoms of significant lymphoma progression at this time. -No indication for additional treatment of the patient's follicular lymphoma at this time.. -We will continue to monitor him  with labs and H&P and imaging as needed - Recommended OTC treatment for his coughing symptoms - Recommend to take Vitamin-D and B-complex supplements. -Educated Patient that Lisinopril can cause cough.   FOLLOW UP: Return to clinic with Dr. [name] with labs in 6 months  The total time spent in the appointment was 20 minutes* .  All of the patient's questions were answered with apparent satisfaction. The patient knows to call the clinic with any problems, questions or concerns.   Sullivan Lone MD MS AAHIVMS [facility] Hematology/Oncology Physician [facility]  .*Total Encounter Time as defined by the Centers for Medicare and Medicaid Services includes, in addition to the face-to-face time of a patient visit (documented in the note above) non-face-to-face time: obtaining and reviewing outside history, ordering and reviewing medications, tests or procedures, care coordination (communications with other health care professionals or caregivers) and documentation in the medical record.   I,Param Shah,acting as a scribe for Sullivan Lone, MD. .I have reviewed the above documentation for accuracy and completeness, and I agree with the above. Brunetta Genera MD

## 2022-06-05 ENCOUNTER — Encounter (HOSPITAL_COMMUNITY): Payer: Self-pay | Admitting: Dentistry

## 2022-06-05 ENCOUNTER — Ambulatory Visit (INDEPENDENT_AMBULATORY_CARE_PROVIDER_SITE_OTHER): Payer: Medicare HMO | Admitting: Dentistry

## 2022-06-05 VITALS — BP 116/73 | HR 57 | Temp 97.8°F

## 2022-06-05 DIAGNOSIS — Z0120 Encounter for dental examination and cleaning without abnormal findings: Secondary | ICD-10-CM | POA: Diagnosis not present

## 2022-06-05 DIAGNOSIS — K0530 Chronic periodontitis, unspecified: Secondary | ICD-10-CM

## 2022-06-05 DIAGNOSIS — K032 Erosion of teeth: Secondary | ICD-10-CM

## 2022-06-05 DIAGNOSIS — K036 Deposits [accretions] on teeth: Secondary | ICD-10-CM

## 2022-06-05 DIAGNOSIS — Z923 Personal history of irradiation: Secondary | ICD-10-CM

## 2022-06-05 DIAGNOSIS — K030 Excessive attrition of teeth: Secondary | ICD-10-CM

## 2022-06-05 DIAGNOSIS — K06020 Generalized gingival recession, unspecified: Secondary | ICD-10-CM

## 2022-06-05 DIAGNOSIS — K08109 Complete loss of teeth, unspecified cause, unspecified class: Secondary | ICD-10-CM

## 2022-06-05 DIAGNOSIS — K029 Dental caries, unspecified: Secondary | ICD-10-CM

## 2022-06-05 NOTE — Progress Notes (Signed)
[facility]     TODAY'S VISIT:   6 MONTH RECALL   PROCEDURES: Adult prophylaxis & periodic exam ASSESSMENT: Soft tissue:  WNL Caries risk:  High Periodontal impression: Chronic periodontitis (localized) PLAN/RECOMMENDATIONS: Continue with 6 month recall visits;  next recall:  update radiographs & probing depths. Call if any questions or concerns arise before next visit.  NEXT VISIT:  6 month recall/denture adjustment as needed    Service Date:   06/05/2022  Patient Name:   Jeffery Wilson Date of Birth:   1951-02-17 Medical Record Number: [id number]   HISTORY OF PRESENT ILLNESS: Jeffery Wilson is a very pleasant 71 y.o. male with history of hypertension and follicular lymphoma (status-post chemoradiation therapy) who is a patient of record at the hospital dental clinic.  The patient presents today for a periodic oral examination and a cleaning.   Medical and dental history were reviewed with the patient.  The patient's last recall visit was on 11/28/2021.   CHIEF COMPLAINT: Here for a routine recall visit;  patient with no complaints.   Patient Active Problem List   Diagnosis Date Noted   Encounter for fitting and adjustment of dental prosthetic device 04/19/2022   Loss of teeth due to extraction 02/06/2022   Encounter for dental examination and cleaning without abnormal findings 12/07/2021   History of head and neck radiation 12/07/2021   Accretions on teeth 12/07/2021   Defective dental restoration 12/07/2021   Attrition, teeth excessive 12/07/2021   Gingival recession, generalized 12/07/2021   Teeth missing 12/07/2021   Loose, teeth 12/07/2021   Cracked tooth 08/08/2021   Chronic periodontitis 08/08/2021   Caries 05/08/2021   Abfraction 71/69/6789   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 ([place]) 05/12/2018   Counseling regarding advance care planning and goals of care 05/12/2018   Past Medical History:   Diagnosis Date   Cancer ([place])    Hypertension    Lymphoma ([place])    Pre-diabetes    Renal disorder    pt says stage 3 kidney dz    Past Surgical History:  Procedure Laterality Date   HAMMER TOE SURGERY     KNEE SURGERY     PAROTIDECTOMY Left 03/07/2021   Procedure: LEFT PAROTIDECTOMY WITH FACIAL NERVE DISSECTION;  Surgeon: [name], MD;  Location: [facility];  Service: ENT;  Laterality: Left;   No Known Allergies Current Outpatient Medications  Medication Sig Dispense Refill   acetaminophen (TYLENOL) 500 MG tablet Take 1,000 mg by mouth at bedtime.     cetirizine (ZYRTEC ALLERGY) 10 MG tablet Take 1 tablet (10 mg total) by mouth daily. 30 tablet 0   Coal Tar Extract [id number] PSORIASIS MEDICATED EX) Apply 1 application topically daily.     [employer] Omega-3 Krill Oil 350 MG CAPS Take 350 mg by mouth daily.     Echinacea 650 MG CAPS Take 650 mg by mouth daily.     furosemide (LASIX) 40 MG tablet Take 40 mg by mouth daily.      HYDROcodone-acetaminophen (NORCO) 7.5-325 MG tablet Take 1 tablet by mouth every 6 (six) hours as needed for moderate pain. (Patient not taking: Reported on 05/29/2021) 20 tablet 0   hydroxypropyl methylcellulose / hypromellose (ISOPTO TEARS / GONIOVISC) 2.5 % ophthalmic solution Place 1 drop into both eyes 3 (three) times daily as needed for dry eyes.     metoprolol succinate (TOPROL-XL) 50 MG 24 hr tablet Take 50 mg by mouth daily. Take  with or immediately following a meal.     Milk Thistle 1000 MG CAPS Take 1,000 mg by mouth daily.     Multiple Vitamins-Minerals (MULTIVITAMIN PO) Take 1 tablet by mouth daily.     ondansetron (ZOFRAN ODT) 8 MG disintegrating tablet Take 1 tablet (8 mg total) by mouth every 8 (eight) hours as needed for nausea or vomiting. (Patient not taking: Reported on 05/29/2021) 20 tablet 0   pravastatin (PRAVACHOL) 20 MG tablet Take 20 mg by mouth at bedtime.      predniSONE (DELTASONE) 10 MG tablet Take 3 tablets (30 mg total) by mouth daily with  breakfast. 15 tablet 0   promethazine-dextromethorphan (PROMETHAZINE-DM) 6.25-15 MG/5ML syrup Take 2.5 mLs by mouth 3 (three) times daily as needed for cough. 100 mL 0   traMADol (ULTRAM) 50 MG tablet Take 1 tablet (50 mg total) by mouth every 6 (six) hours as needed. (Patient not taking: Reported on 05/29/2021) 10 tablet 0   Turmeric 500 MG CAPS Take 500 mg by mouth daily.     vitamin C (ASCORBIC ACID) 500 MG tablet Take 500 mg by mouth daily. (Patient not taking: Reported on 05/29/2021)     No current facility-administered medications for this visit.    LABS: Lab Results  Component Value Date   WBC 7.2 05/28/2022   HGB 15.6 05/28/2022   HCT 46.6 05/28/2022   MCV 99.8 05/28/2022   PLT 232 05/28/2022      Component Value Date/Time   NA 140 05/28/2022 0949   K 4.5 05/28/2022 0949   CL 100 05/28/2022 0949   CO2 33 (H) 05/28/2022 0949   GLUCOSE 99 05/28/2022 0949   BUN 25 (H) 05/28/2022 0949   CREATININE 1.61 (H) 05/28/2022 0949   CALCIUM 10.5 (H) 05/28/2022 0949   GFRNONAA 45 (L) 05/28/2022 0949   GFRAA 57 (L) 03/06/2020 0919   Lab Results  Component Value Date   INR 1.05 01/28/2018   No results found for: "PTT"  Social History   Socioeconomic History   Marital status: Married    Spouse name: Not on file   Number of children: Not on file   Years of education: Not on file   Highest education level: Not on file  Occupational History   Not on file  Tobacco Use   Smoking status: Former    Types: Cigarettes   Smokeless tobacco: Never   Tobacco comments:    1 cigarette per day  Vaping Use   Vaping Use: Never used  Substance and Sexual Activity   Alcohol use: Yes    Comment: occ   Drug use: No   Sexual activity: Not on file  Other Topics Concern   Not on file  Social History Narrative   Not on file   Social Determinants of Health   Financial Resource Strain: Not on file  Food Insecurity: Not on file  Transportation Needs: Not on file  Physical Activity: Not  on file  Stress: Not on file  Social Connections: Not on file  Intimate Partner Violence: Not on file   History reviewed. No pertinent family history.   REVIEW OF SYSTEMS:  Reviewed with the patient as per HPI. PSYCH: Patient denies having dental phobia.   VITAL SIGNS: BP 116/73 (BP Location: Right Arm, Patient Position: Sitting, Cuff Size: Normal)   Pulse (!) 57   Temp 97.8 F (36.6 C) (Oral)    PHYSICAL EXAM:  Soft tissue exam completed and all updates were charted.  GENERAL:  Well-developed, comfortable and in no apparent distress. NEUROLOGICAL:  Alert and oriented to person, place and  time. EXTRAORAL:  Head size, head shape, facial symmetry, skin, complexion, pigmentation, conjunctiva, oral labia, parotid gland, submandibular gland, thyroid, cervical and preauricular lymph nodes, submandibular and submental lymph nodes, tonsillar and occipital lymph nodes and supraclavicular lymph nodes WNL. No swelling or lymphadenopathy.  TMJ asymptomatic without clicks or crepitations. INTRAORAL:  Soft tissues appear well-perfused and mucous membranes moist.  FOM and vestibules soft and not raised. Oral cavity without mass or lesion. No signs of infection, parulis, sinus tract, edema or erythema evident upon exam.   DENTAL EXAM:  Hard tissue exam was completed and all updates were charted.     OVERALL IMPRESSION:  Fair remaining dentition.       ORAL HYGIENE:  Good   HARD TISSUE:  MISSING:  #12, #13, #15, #16, #17, #19, #29, #30, #31, #32  CARIES:  Incipient lesions:  #3 M, #21 O REMOVABLE/FIXED PROSTHODONTICS:  Existing lower removable partial denture. The patient reports good function, retention and esthetics.  OCCLUSION:  Non-functional teeth:  #1, #2, #3, #20, #21  Other:  #18 drifting mesial OTHER FINDINGS:   [+] Attrition/wear:  #6-#11 incisal; #22-#27 incisal [+] Abfraction/flexure:  #4 B(V), #5 B(V), #18 B(V)   PERIODONTAL: PLAQUE:  Slight, localized CALCULUS:   Localized accumulation on mandibular anterior teeth lingual surfaces and interproximal surfaces of maxillary molars  RECESSION:  Generalized, mild to moderate GINGIVAL APPEARANCE:  Pink, healthy gingival tissue with blunted papilla.  Exam was performed by Sandi Mariscal, DMD with Leta Speller, DAII acting as scribe.   RADIOGRAPHIC EXAM:  None exposed at today's visit.   ASSESSMENT:  Periodic dental exam and cleaning History of head and neck radiation therapy Missing teeth Incipient lesions Accretions on teeth Chronic periodontitis Abfraction/flexure Attrition/wear Gingival recession, generalized   ANTIBIOTIC PROPHYLAXIS/OTHER PREMEDICATION:[ N/A ]   PROCEDURES: Adult prophylaxis. Removed all supra- and subgingival calculus and plaque using Cavitron and hand instruments. All teeth were polished by Leta Speller, DAII.  Flossed. Oral hygiene instruction discussed with the patient. Toothbrush, toothpaste and floss given to the patient.   PLAN AND RECOMMENDATIONS: I explained all significant findings of the dental exam with the patient including some light tartar build-up in between his teeth and a couple of very small (incipient) caries which we will just plan to monitor at this time during his recall visits. The patient verbalized understanding of all findings, discussion and recommendations.  Continue 6 month recall interval.  Plan to update radiographs (BWs) and record probing depths at next recall visit. Call if questions or concerns arise before next visit.   NEXT VISIT:  6 month recall or partial denture adjustment as needed  All questions and concerns were invited and addressed.  The patient tolerated today's visit well and departed in stable condition.  -Sandi Mariscal, DMD

## 2022-06-12 DIAGNOSIS — K029 Dental caries, unspecified: Secondary | ICD-10-CM | POA: Insufficient documentation

## 2022-07-17 ENCOUNTER — Telehealth (HOSPITAL_COMMUNITY): Payer: Self-pay

## 2022-11-26 ENCOUNTER — Other Ambulatory Visit: Payer: Self-pay

## 2022-11-26 DIAGNOSIS — C8211 Follicular lymphoma grade II, lymph nodes of head, face, and neck: Secondary | ICD-10-CM

## 2022-11-27 ENCOUNTER — Other Ambulatory Visit: Payer: Self-pay

## 2022-11-27 ENCOUNTER — Inpatient Hospital Stay (HOSPITAL_BASED_OUTPATIENT_CLINIC_OR_DEPARTMENT_OTHER): Payer: Medicare Other | Admitting: Hematology

## 2022-11-27 ENCOUNTER — Inpatient Hospital Stay: Payer: Medicare Other | Attending: Hematology

## 2022-11-27 VITALS — BP 134/75 | HR 54 | Temp 97.7°F | Resp 20 | Wt 257.3 lb

## 2022-11-27 DIAGNOSIS — Z79899 Other long term (current) drug therapy: Secondary | ICD-10-CM | POA: Insufficient documentation

## 2022-11-27 DIAGNOSIS — Z8572 Personal history of non-Hodgkin lymphomas: Secondary | ICD-10-CM | POA: Insufficient documentation

## 2022-11-27 DIAGNOSIS — N183 Chronic kidney disease, stage 3 unspecified: Secondary | ICD-10-CM | POA: Diagnosis not present

## 2022-11-27 DIAGNOSIS — C8211 Follicular lymphoma grade II, lymph nodes of head, face, and neck: Secondary | ICD-10-CM | POA: Diagnosis not present

## 2022-11-27 DIAGNOSIS — Z9221 Personal history of antineoplastic chemotherapy: Secondary | ICD-10-CM | POA: Insufficient documentation

## 2022-11-27 DIAGNOSIS — Z87891 Personal history of nicotine dependence: Secondary | ICD-10-CM | POA: Insufficient documentation

## 2022-11-27 LAB — CMP (CANCER CENTER ONLY)
ALT: 18 U/L (ref 0–44)
AST: 21 U/L (ref 15–41)
Albumin: 4.7 g/dL (ref 3.5–5.0)
Alkaline Phosphatase: 58 U/L (ref 38–126)
Anion gap: 5 (ref 5–15)
BUN: 25 mg/dL — ABNORMAL HIGH (ref 8–23)
CO2: 32 mmol/L (ref 22–32)
Calcium: 10.5 mg/dL — ABNORMAL HIGH (ref 8.9–10.3)
Chloride: 102 mmol/L (ref 98–111)
Creatinine: 1.78 mg/dL — ABNORMAL HIGH (ref 0.61–1.24)
GFR, Estimated: 40 mL/min — ABNORMAL LOW (ref >60)
Glucose, Bld: 98 mg/dL (ref 70–99)
Potassium: 5 mmol/L (ref 3.5–5.1)
Sodium: 139 mmol/L (ref 135–145)
Total Bilirubin: 0.8 mg/dL (ref 0.3–1.2)
Total Protein: 7.7 g/dL (ref 6.5–8.1)

## 2022-11-27 LAB — CBC WITH DIFFERENTIAL (CANCER CENTER ONLY)
Abs Immature Granulocytes: 0.02 10*3/uL (ref 0.00–0.07)
Basophils Absolute: 0 10*3/uL (ref 0.0–0.1)
Basophils Relative: 1 %
Eosinophils Absolute: 0.1 10*3/uL (ref 0.0–0.5)
Eosinophils Relative: 3 %
HCT: 44 % (ref 39.0–52.0)
Hemoglobin: 15.3 g/dL (ref 13.0–17.0)
Immature Granulocytes: 0 %
Lymphocytes Relative: 28 %
Lymphs Abs: 1.3 10*3/uL (ref 0.7–4.0)
MCH: 34.2 pg — ABNORMAL HIGH (ref 26.0–34.0)
MCHC: 34.8 g/dL (ref 30.0–36.0)
MCV: 98.4 fL (ref 80.0–100.0)
Monocytes Absolute: 0.7 10*3/uL (ref 0.1–1.0)
Monocytes Relative: 14 %
Neutro Abs: 2.6 10*3/uL (ref 1.7–7.7)
Neutrophils Relative %: 54 %
Platelet Count: 198 10*3/uL (ref 150–400)
RBC: 4.47 MIL/uL (ref 4.22–5.81)
RDW: 13 % (ref 11.5–15.5)
WBC Count: 4.8 10*3/uL (ref 4.0–10.5)
nRBC: 0 % (ref 0.0–0.2)

## 2022-11-27 LAB — LACTATE DEHYDROGENASE: LDH: 127 U/L (ref 98–192)

## 2022-11-27 NOTE — Progress Notes (Signed)
HEMATOLOGY/ONCOLOGY CLINIC NOTE  Date of Service: 11/27/22    Patient Care Team: Stevphen Rochester, MD as PCP - General (Family Medicine) Malmfelt, Lise Auer, RN as Oncology Nurse Navigator Candise Che, Corene Cornea, MD as Consulting Physician (Hematology) Lonie Peak, MD as Consulting Physician (Radiation Oncology)  CHIEF COMPLAINTS/PURPOSE OF CONSULTATION:  Follow-up for follicular lymphoma  HISTORY OF PRESENTING ILLNESS:  See previous note for detail  Interval History:  Jeffery Wilson is a 72 y/o male here for continued evaluation and management of his follicular lymphoma.   Patient was last seen by me on 05/28/2022 and reported a viral infection in November. He complained of exacerbation when lying down and mild abdominal soreness.   Today, he is accompanied by his wife. He reports that he has felt well overall since his last visit. He denies any new lumps/bumps, fever, chills, night sweats, infections, breathing issues, dental issues, abdominal pain, or testicular pain/swelling.  His diabetes mellitus has been stable. He reports that he has not been drinking at least 2L of water daily. He continues to take Lasix regularly. He does not take any NSAID medications.  MEDICAL HISTORY:  Past Medical History:  Diagnosis Date   Cancer ([facility])    Hypertension    Lymphoma ([facility])    Pre-diabetes    Renal disorder    pt says stage 3 kidney dz     SURGICAL HISTORY: Past Surgical History:  Procedure Laterality Date   HAMMER TOE SURGERY     KNEE SURGERY     PAROTIDECTOMY Left 03/07/2021   Procedure: LEFT PAROTIDECTOMY WITH FACIAL NERVE DISSECTION;  Surgeon: Serena Colonel, MD;  Location: [facility] OR;  Service: ENT;  Laterality: Left;    SOCIAL HISTORY: Social History   Socioeconomic History   Marital status: Married    Spouse name: Not on file   Number of children: Not on file   Years of education: Not on file   Highest education level: Not on file  Occupational History   Not  on file  Tobacco Use   Smoking status: Former    Types: Cigarettes   Smokeless tobacco: Never   Tobacco comments:    1 cigarette per day  Vaping Use   Vaping Use: Never used  Substance and Sexual Activity   Alcohol use: Yes    Comment: occ   Drug use: No   Sexual activity: Not on file  Other Topics Concern   Not on file  Social History Narrative   Not on file   Social Determinants of Health   Financial Resource Strain: Not on file  Food Insecurity: Not on file  Transportation Needs: Not on file  Physical Activity: Not on file  Stress: Not on file  Social Connections: Not on file  Intimate Partner Violence: Not on file    FAMILY HISTORY: No family history on file.  ALLERGIES:  has No Known Allergies.  MEDICATIONS:  Current Outpatient Medications  Medication Sig Dispense Refill   acetaminophen (TYLENOL) 500 MG tablet Take 1,000 mg by mouth at bedtime.     cetirizine (ZYRTEC ALLERGY) 10 MG tablet Take 1 tablet (10 mg total) by mouth daily. 30 tablet 0   Coal Tar Extract [phone] PSORIASIS MEDICATED EX) Apply 1 application topically daily.     [employer] Omega-3 Krill Oil 350 MG CAPS Take 350 mg by mouth daily.     Echinacea 650 MG CAPS Take 650 mg by mouth daily.     furosemide (LASIX) 40 MG tablet Take  40 mg by mouth daily.      HYDROcodone-acetaminophen (NORCO) 7.5-325 MG tablet Take 1 tablet by mouth every 6 (six) hours as needed for moderate pain. (Patient not taking: Reported on 05/29/2021) 20 tablet 0   hydroxypropyl methylcellulose / hypromellose (ISOPTO TEARS / GONIOVISC) 2.5 % ophthalmic solution Place 1 drop into both eyes 3 (three) times daily as needed for dry eyes.     metoprolol succinate (TOPROL-XL) 50 MG 24 hr tablet Take 50 mg by mouth daily. Take with or immediately following a meal.     Milk Thistle 1000 MG CAPS Take 1,000 mg by mouth daily.     Multiple Vitamins-Minerals (MULTIVITAMIN PO) Take 1 tablet by mouth daily.     ondansetron (ZOFRAN ODT) 8 MG  disintegrating tablet Take 1 tablet (8 mg total) by mouth every 8 (eight) hours as needed for nausea or vomiting. (Patient not taking: Reported on 05/29/2021) 20 tablet 0   pravastatin (PRAVACHOL) 20 MG tablet Take 20 mg by mouth at bedtime.      predniSONE (DELTASONE) 10 MG tablet Take 3 tablets (30 mg total) by mouth daily with breakfast. 15 tablet 0   promethazine-dextromethorphan (PROMETHAZINE-DM) 6.25-15 MG/5ML syrup Take 2.5 mLs by mouth 3 (three) times daily as needed for cough. 100 mL 0   traMADol (ULTRAM) 50 MG tablet Take 1 tablet (50 mg total) by mouth every 6 (six) hours as needed. (Patient not taking: Reported on 05/29/2021) 10 tablet 0   Turmeric 500 MG CAPS Take 500 mg by mouth daily.     vitamin C (ASCORBIC ACID) 500 MG tablet Take 500 mg by mouth daily. (Patient not taking: Reported on 05/29/2021)     No current facility-administered medications for this visit.    REVIEW OF SYSTEMS:   10 Point review of [employer] was done is negative except as noted above.  PHYSICAL EXAMINATION: ECOG PERFORMANCE STATUS: 1 - Symptomatic but completely ambulatory  Vitals:   11/27/22 1056  BP: 134/75  Pulse: (!) 54  Resp: 20  Temp: 97.7 F (36.5 C)  SpO2: 99%   Filed Weights   11/27/22 1056  Weight: 257 lb 4.8 oz (116.7 kg)   .Body mass index is 35.89 kg/m.  Marland KitchenGENERAL:alert, in no acute distress and comfortable SKIN: no acute rashes, no significant lesions EYES: conjunctiva are pink and non-injected, sclera anicteric OROPHARYNX: MMM, no exudates, no oropharyngeal erythema or ulceration NECK: supple, no JVD LYMPH:  no palpable lymphadenopathy in the cervical, axillary or inguinal regions LUNGS: clear to auscultation b/l with normal respiratory effort HEART: regular rate & rhythm ABDOMEN:  normoactive bowel sounds , non tender, not distended. Extremity: no pedal edema PSYCH: alert & oriented x 3 with fluent speech NEURO: no focal motor/sensory deficits  LABORATORY DATA:  I have  reviewed the data as listed  .    Latest Ref Rng & Units 11/27/2022    9:55 AM 05/28/2022    9:49 AM 11/27/2021    9:40 AM  CBC  WBC 4.0 - 10.5 K/uL 4.8  7.2  5.0   Hemoglobin 13.0 - 17.0 g/dL 16.1  09.6  04.5   Hematocrit 39.0 - 52.0 % 44.0  46.6  45.5   Platelets 150 - 400 K/uL 198  232  171     .    Latest Ref Rng & Units 11/27/2022    9:55 AM 05/28/2022    9:49 AM 11/27/2021    9:40 AM  CMP  Glucose 70 - 99 mg/dL 98  99  123   BUN 8 - 23 mg/dL 25  25  21    Creatinine 0.61 - 1.24 mg/dL 1.61  0.96  0.45   Sodium 135 - 145 mmol/L 139  140  138   Potassium 3.5 - 5.1 mmol/L 5.0  4.5  3.9   Chloride 98 - 111 mmol/L 102  100  103   CO2 22 - 32 mmol/L 32  33  26   Calcium 8.9 - 10.3 mg/dL 40.9  81.1  9.8   Total Protein 6.5 - 8.1 g/dL 7.7  7.7  7.4   Total Bilirubin 0.3 - 1.2 mg/dL 0.8  0.7  0.7   Alkaline Phos 38 - 126 U/L 58  56  57   AST 15 - 41 U/L 21  28  22    ALT 0 - 44 U/L 18  37  20    . Lab Results  Component Value Date   LDH 127 11/27/2022    Component     Latest Ref Rng & Units 03/24/2018  Hepatitis B Surface Ag     Negative Negative  Hep B Core Ab, Tot     Negative Negative  HIV Screen 4th Generation wRfx     Non Reactive Non Reactive  HCV Ab     0.0 - 0.9 s/co ratio <0.1  LDH     98 - 192 U/L 139   01/28/18 Submandibular gland biopsy:    RADIOGRAPHIC STUDIES: I have personally reviewed the radiological images as listed and agreed with the findings in the report. No results found.  ASSESSMENT & PLAN:   72 y.o. male with  1. 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 Additional nonenlarged lymph nodes in the left neck shows low level FDG accumulation, suspicious for tumor involvement. 2. Tiny right groin lymph node shows low level FDG uptake, indeterminate. Otherwise, no evidence for hypermetabolic disease in the chest, abdomen, or pelvis. 3. Hepatic steatosis. 4. Cholelithiasis. 5. Colonic diverticulosis without diverticulitis.   04/24/18 Left cervical lymph node incisional biopsy revealed Follicular lymphoma, Grade 1-2 with a 10% proliferation rate   S/p 4 weekly cycles of Rituxan (limited rate) completed on 06/09/18  08/04/18 PET/CT revealed Response to therapy, as evidenced by mildly decreased size and hypermetabolism within a dominant left submandibular mass. The previously described left-sided cervical nodal hypermetabolism has resolved. 2. Decrease in right inguinal nodal hypermetabolism, favored to be physiologic or reactive. 3. No new or progressive disease. 4. Incidental findings, including cholelithiasis, left adrenal adenoma.  PLAN:  -Discussed lab results on 11/27/2022 in detail with patient. CBC normal, showed WBC of 4.8K, hemoglobin of 15.3, and platelets of 198K. -CMP shows stable CKD, calcium levels high normal at 10.5. Creatinine 1.78. Patient's previous levels ranged 1.4-1.7. Informed patient that creatinine typically fluctuates with hydration levels. Also informed patient of importance to stay hydrated with Lasix intake. -patient's weight loss may be due to dehydration -advised patient to limit salt intake to improve kidney function -Advised patient to drink at least 2L of water daily -LDH normal -Patient has no lab evidence or clinical  signs or symptoms of significant lymphoma progression at this time. -No indication for additional treatment of the patient's follicular lymphoma at this time. -We will continue to monitor him with labs and H&P and imaging as needed -Continue to take Vitamin-D and B-complex  supplements. -will continue to monitor with labs in 1 year  -would recommend CT w/o  IV dye in one year prior to next visit  FOLLOW UP: Return to clinic with Dr. Candise Che with labs in 12 months  The total time spent in the appointment was 20 minutes* .  All of the patient's questions were answered with apparent satisfaction. The patient knows to call the clinic with any problems, questions or concerns.   Wyvonnia Lora MD MS AAHIVMS [facility] Hematology/Oncology Physician [facility]  .*Total Encounter Time as defined by the Centers for Medicare and Medicaid Services includes, in addition to the face-to-face time of a patient visit (documented in the note above) non-face-to-face time: obtaining and reviewing outside history, ordering and reviewing medications, tests or procedures, care coordination (communications with other health care professionals or caregivers) and documentation in the medical record.   I,Mitra Faeizi,acting as a Neurosurgeon for Wyvonnia Lora, MD.,have documented all relevant documentation on the behalf of Wyvonnia Lora, MD,as directed by  Wyvonnia Lora, MD while in the presence of Wyvonnia Lora, MD.  .I have reviewed the above documentation for accuracy and completeness, and I agree with the above. Johney Maine MD

## 2022-12-11 ENCOUNTER — Encounter (HOSPITAL_COMMUNITY): Payer: Medicare HMO | Admitting: Dentistry

## 2023-12-02 ENCOUNTER — Other Ambulatory Visit: Payer: Self-pay

## 2023-12-02 DIAGNOSIS — C8211 Follicular lymphoma grade II, lymph nodes of head, face, and neck: Secondary | ICD-10-CM

## 2023-12-02 NOTE — Progress Notes (Signed)
 HEMATOLOGY/ONCOLOGY CLINIC NOTE  Date of Service: 12/03/2023    Patient Care Team: Authur Leghorn, MD as PCP - General (Family Medicine) Malmfelt, Nancyann Aye, RN as Oncology Nurse Navigator Salomon Cree, Orion Birks, MD as Consulting Physician (Hematology) Colie Dawes, MD as Consulting Physician (Radiation Oncology)  CHIEF COMPLAINTS/PURPOSE OF CONSULTATION:  Follow-up for follicular lymphoma  HISTORY OF PRESENTING ILLNESS:  See previous note for detail  Interval History:  Jeffery Wilson is a 73 y/o male here for continued evaluation and management of his follicular lymphoma.   Patient was last seen by me on 11/27/2022 and was doing well overall with no new medical complaints.   He is accompanied by his wife during today's visit.   He complains of coughing over the last 2 months, which he attributes to allergies. Patient denies fever. Patient has tried allergy medication which is mildly improving symptoms.   Patient denies any new lumps/bumps, fever, chills, night sweats, new skin rashes, or major change in breathing.   Patient reports that he was seen by his cardiologist and had electrocardiogram in April, which showed no concerns. He reports that there are plans for stress test and notes that this was not triggered by symptoms.   He denies any abdominal pain or calf pain.   He is generally is staying physically active including walking his dog, though he notes his joint pain limits him a fair amount in regards to other physical activity. Patient reports that he is needing knee replacement.   Patient complains of bilateral knee pain. He notes that he waddles when he walks. Patient is taking Bi-Flex for knee pain.   His wife notes that they typically cook with Avocado oil, and also use 100% olive oil. She notes they use canola oil in moderation when frying foods.   MEDICAL HISTORY:  Past Medical History:  Diagnosis Date   Cancer (HCC)    Hypertension    Lymphoma  (HCC)    Pre-diabetes    Renal disorder    pt says stage 3 kidney dz     SURGICAL HISTORY: Past Surgical History:  Procedure Laterality Date   HAMMER TOE SURGERY     KNEE SURGERY     PAROTIDECTOMY Left 03/07/2021   Procedure: LEFT PAROTIDECTOMY WITH FACIAL NERVE DISSECTION;  Surgeon: Janita Mellow, MD;  Location: [facility] OR;  Service: ENT;  Laterality: Left;    SOCIAL HISTORY: Social History   Socioeconomic History   Marital status: Married    Spouse name: Not on file   Number of children: Not on file   Years of education: Not on file   Highest education level: Not on file  Occupational History   Not on file  Tobacco Use   Smoking status: Former    Types: Cigarettes   Smokeless tobacco: Never   Tobacco comments:    1 cigarette per day  Vaping Use   Vaping status: Never Used  Substance and Sexual Activity   Alcohol use: Yes    Comment: occ   Drug use: No   Sexual activity: Not on file  Other Topics Concern   Not on file  Social History Narrative   Not on file   Social Drivers of Health   Financial Resource Strain: Low Risk  (11/24/2023)   Received from [facility]   Overall Financial Resource Strain (CARDIA)    Difficulty of Paying Living Expenses: Not hard at all  Food Insecurity: No Food Insecurity (11/24/2023)   Received from [facility]  Hunger Vital Sign    Within the past 12 months, you worried that your food would run out before you got the money to buy more.: Never true    Within the past 12 months, the food you bought just didn't last and you didn't have money to get more.: Never true  Transportation Needs: No Transportation Needs (11/24/2023)   Received from [facility]   PRAPARE - Transportation    Lack of Transportation (Medical): No    Lack of Transportation (Non-Medical): No  Physical Activity: Unknown (06/16/2023)   Received from [facility]   Exercise Vital Sign    On average, how many days per week do you engage in moderate to strenuous  exercise (like a brisk walk)?: 0 days    Minutes of Exercise per Session: Not on file  Stress: No Stress Concern Present (06/16/2023)   Received from [facility] - Occupational Stress Questionnaire    Feeling of Stress : Not at all  Social Connections: Moderately Integrated (06/16/2023)   Received from [facility]   Social Network    How would you rate your social network (family, work, friends)?: Adequate participation with social networks  Intimate Partner Violence: Not At Risk (06/16/2023)   Received from [facility]   HITS    Over the last 12 months how often did your partner physically hurt you?: Never    Over the last 12 months how often did your partner insult you or talk down to you?: Never    Over the last 12 months how often did your partner threaten you with physical harm?: Never    Over the last 12 months how often did your partner scream or curse at you?: Never    FAMILY HISTORY: No family history on file.  ALLERGIES:  has no known allergies.  MEDICATIONS:  Current Outpatient Medications  Medication Sig Dispense Refill   acetaminophen  (TYLENOL ) 500 MG tablet Take 1,000 mg by mouth at bedtime.     cetirizine  (ZYRTEC  ALLERGY) 10 MG tablet Take 1 tablet (10 mg total) by mouth daily. 30 tablet 0   Coal Tar Extract [phone] PSORIASIS MEDICATED EX) Apply 1 application topically daily.     [employer] Omega-3 Krill Oil 350 MG CAPS Take 350 mg by mouth daily.     Echinacea 650 MG CAPS Take 650 mg by mouth daily.     furosemide (LASIX) 40 MG tablet Take 40 mg by mouth daily.      HYDROcodone -acetaminophen  (NORCO) 7.5-325 MG tablet Take 1 tablet by mouth every 6 (six) hours as needed for moderate pain. (Patient not taking: Reported on 05/29/2021) 20 tablet 0   hydroxypropyl methylcellulose / hypromellose (ISOPTO TEARS / GONIOVISC) 2.5 % ophthalmic solution Place 1 drop into both eyes 3 (three) times daily as needed for dry eyes.      metoprolol succinate (TOPROL-XL) 50 MG 24 hr tablet Take 50 mg by mouth daily. Take with or immediately following a meal.     Milk Thistle 1000 MG CAPS Take 1,000 mg by mouth daily.     Multiple Vitamins-Minerals (MULTIVITAMIN PO) Take 1 tablet by mouth daily.     ondansetron  (ZOFRAN  ODT) 8 MG disintegrating tablet Take 1 tablet (8 mg total) by mouth every 8 (eight) hours as needed for nausea or vomiting. (Patient not taking: Reported on 05/29/2021) 20 tablet 0   pravastatin (PRAVACHOL) 20 MG tablet Take 20 mg by mouth at bedtime.      predniSONE  (  DELTASONE ) 10 MG tablet Take 3 tablets (30 mg total) by mouth daily with breakfast. 15 tablet 0   promethazine -dextromethorphan (PROMETHAZINE -DM) 6.25-15 MG/5ML syrup Take 2.5 mLs by mouth 3 (three) times daily as needed for cough. 100 mL 0   traMADol  (ULTRAM ) 50 MG tablet Take 1 tablet (50 mg total) by mouth every 6 (six) hours as needed. (Patient not taking: Reported on 05/29/2021) 10 tablet 0   Turmeric 500 MG CAPS Take 500 mg by mouth daily.     vitamin C (ASCORBIC ACID) 500 MG tablet Take 500 mg by mouth daily. (Patient not taking: Reported on 05/29/2021)     No current facility-administered medications for this visit.    REVIEW OF SYSTEMS:   10 Point review of [employer] was done is negative except as noted above.  PHYSICAL EXAMINATION: ECOG PERFORMANCE STATUS: 1 - Symptomatic but completely ambulatory  Vitals:   12/03/23 1040  BP: 131/75  Pulse: (!) 58  Resp: 18  Temp: 97.9 F (36.6 C)  SpO2: 99%    Filed Weights   12/03/23 1040  Weight: 256 lb 3.2 oz (116.2 kg)    .Body mass index is 35.73 kg/m.    GENERAL:alert, in no acute distress and comfortable SKIN: no acute rashes, no significant lesions EYES: conjunctiva are pink and non-injected, sclera anicteric OROPHARYNX: MMM, no exudates, no oropharyngeal erythema or ulceration NECK: supple, no JVD LYMPH:  no palpable lymphadenopathy in the cervical, axillary or inguinal  regions LUNGS: clear to auscultation b/l with normal respiratory effort HEART: regular rate & rhythm ABDOMEN:  normoactive bowel sounds , non tender, not distended. Extremity: no pedal edema PSYCH: alert & oriented x 3 with fluent speech NEURO: no focal motor/sensory deficits   LABORATORY DATA:  I have reviewed the data as listed  .    Latest Ref Rng & Units 12/03/2023    9:38 AM 11/27/2022    9:55 AM 05/28/2022    9:49 AM  CBC  WBC 4.0 - 10.5 K/uL 6.2  4.8  7.2   Hemoglobin 13.0 - 17.0 g/dL 47.8  29.5  62.1   Hematocrit 39.0 - 52.0 % 44.7  44.0  46.6   Platelets 150 - 400 K/uL 183  198  232     .    Latest Ref Rng & Units 12/03/2023    9:38 AM 11/27/2022    9:55 AM 05/28/2022    9:49 AM  CMP  Glucose 70 - 99 mg/dL 308  98  99   BUN 8 - 23 mg/dL 19  25  25    Creatinine 0.61 - 1.24 mg/dL 6.57  8.46  9.62   Sodium 135 - 145 mmol/L 140  139  140   Potassium 3.5 - 5.1 mmol/L 4.5  5.0  4.5   Chloride 98 - 111 mmol/L 102  102  100   CO2 22 - 32 mmol/L 31  32  33   Calcium 8.9 - 10.3 mg/dL 9.8  95.2  84.1   Total Protein 6.5 - 8.1 g/dL 7.5  7.7  7.7   Total Bilirubin 0.0 - 1.2 mg/dL 0.9  0.8  0.7   Alkaline Phos 38 - 126 U/L 55  58  56   AST 15 - 41 U/L 22  21  28    ALT 0 - 44 U/L 17  18  37    . Lab Results  Component Value Date   LDH 129 12/03/2023    Component     Latest Ref Rng &  Units 03/24/2018  Hepatitis B Surface Ag     Negative Negative  Hep B Core Ab, Tot     Negative Negative  HIV Screen 4th Generation wRfx     Non Reactive Non Reactive  HCV Ab     0.0 - 0.9 s/co ratio <0.1  LDH     98 - 192 U/L 139   01/28/18 Submandibular gland biopsy:    RADIOGRAPHIC STUDIES: I have personally reviewed the radiological images as listed and agreed with the findings in the report. No results found.  ASSESSMENT & PLAN:   73 y.o. male with  1. 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 Additional nonenlarged lymph nodes in the left neck shows low level FDG accumulation, suspicious for tumor involvement. 2. Tiny right groin lymph node shows low level FDG uptake, indeterminate. Otherwise, no evidence for hypermetabolic disease in the chest, abdomen, or pelvis. 3. Hepatic steatosis. 4. Cholelithiasis. 5. Colonic diverticulosis without diverticulitis.   04/24/18 Left cervical lymph node incisional biopsy revealed Follicular lymphoma, Grade 1-2 with a 10% proliferation rate   S/p 4 weekly cycles of Rituxan  (limited rate) completed on 06/09/18  08/04/18 PET/CT revealed Response to therapy, as evidenced by mildly decreased size and hypermetabolism within a dominant left submandibular mass. The previously described left-sided cervical nodal hypermetabolism has resolved. 2. Decrease in right inguinal nodal hypermetabolism, favored to be physiologic or reactive. 3. No new or progressive disease. 4. Incidental findings, including cholelithiasis, left adrenal adenoma.  PLAN:  -Discussed lab results on 12/03/2023 in detail with patient. CBC normal, showed WBC of 6.2K, hemoglobin of 15.4, and platelets of 183K. -CMP shows stable kidney numbers. Creatinine improved from 1.78 to 1.46 over the last year.  -calcium normalized from 10.5 to 9.8 over one year, which suggests that he is well-hydrated -electrolytes normal -LDH normal at 129 -did not feel any enlarged lymph nodes, spleen, or liver on physical examination -there is no clinical sign or lab evidence suggestive of lymphoma progression at this time -No indication for additional treatment of his  follicular lymphoma at this time.  -discussed that chronic cough is commonly from post nasal drift or acid reflux -recommend using OTC semi-restrictive knee braces to take pressure off of the knee when walking -okay to continue Bi-Flex for joint pain -recommend engaging in physical activities that do not put too much pressure on the knees, such as swimming -discussed that physical activity can improve knees, back, posture, and muscle strength -recommend generally cooking with healthier oils -answered all of patient's and his wife's questions in detail -patient shall return to clinic in 1 year. If things very stable after a few years, he would have the option of being discharged to PCP then only see us  as needed  FOLLOW UP: RTC with Dr Salomon Cree with labs in 12 months  The total time spent in the appointment was 20 minutes* .  All of the patient's questions were answered with apparent satisfaction. The patient knows to call the clinic with any problems, questions or concerns.   Jacquelyn Matt MD MS AAHIVMS [facility]  Hematology/Oncology Physician [facility]  .*Total Encounter Time as defined by the Centers for Medicare and Medicaid Services includes, in addition to the face-to-face time of a patient visit (documented in the note above) non-face-to-face time: obtaining and reviewing outside history, ordering and reviewing medications, tests or procedures, care coordination (communications with other health care professionals or caregivers) and documentation in the medical record.    I,Mitra Faeizi,acting as a Neurosurgeon for Jacquelyn Matt, MD.,have documented all relevant documentation on the behalf of Jacquelyn Matt, MD,as directed by  Jacquelyn Matt, MD while in the presence of Jacquelyn Matt, MD.  .I have reviewed the above documentation for accuracy and completeness, and I agree with the above. .Prashant Glosser Kishore Kaitlin Ardito MD

## 2023-12-03 ENCOUNTER — Inpatient Hospital Stay: Payer: Medicare Other | Attending: Hematology

## 2023-12-03 ENCOUNTER — Inpatient Hospital Stay: Payer: Medicare Other | Admitting: Hematology

## 2023-12-03 VITALS — BP 131/75 | HR 58 | Temp 97.9°F | Resp 18 | Wt 256.2 lb

## 2023-12-03 DIAGNOSIS — C8211 Follicular lymphoma grade II, lymph nodes of head, face, and neck: Secondary | ICD-10-CM

## 2023-12-03 DIAGNOSIS — Z8572 Personal history of non-Hodgkin lymphomas: Secondary | ICD-10-CM | POA: Diagnosis present

## 2023-12-03 DIAGNOSIS — Z87891 Personal history of nicotine dependence: Secondary | ICD-10-CM | POA: Insufficient documentation

## 2023-12-03 LAB — CBC WITH DIFFERENTIAL (CANCER CENTER ONLY)
Abs Immature Granulocytes: 0.02 10*3/uL (ref 0.00–0.07)
Basophils Absolute: 0 10*3/uL (ref 0.0–0.1)
Basophils Relative: 1 %
Eosinophils Absolute: 0.1 10*3/uL (ref 0.0–0.5)
Eosinophils Relative: 2 %
HCT: 44.7 % (ref 39.0–52.0)
Hemoglobin: 15.4 g/dL (ref 13.0–17.0)
Immature Granulocytes: 0 %
Lymphocytes Relative: 18 %
Lymphs Abs: 1.1 10*3/uL (ref 0.7–4.0)
MCH: 33.3 pg (ref 26.0–34.0)
MCHC: 34.5 g/dL (ref 30.0–36.0)
MCV: 96.5 fL (ref 80.0–100.0)
Monocytes Absolute: 0.6 10*3/uL (ref 0.1–1.0)
Monocytes Relative: 10 %
Neutro Abs: 4.3 10*3/uL (ref 1.7–7.7)
Neutrophils Relative %: 69 %
Platelet Count: 183 10*3/uL (ref 150–400)
RBC: 4.63 MIL/uL (ref 4.22–5.81)
RDW: 12.9 % (ref 11.5–15.5)
WBC Count: 6.2 10*3/uL (ref 4.0–10.5)
nRBC: 0 % (ref 0.0–0.2)

## 2023-12-03 LAB — CMP (CANCER CENTER ONLY)
ALT: 17 U/L (ref 0–44)
AST: 22 U/L (ref 15–41)
Albumin: 4.6 g/dL (ref 3.5–5.0)
Alkaline Phosphatase: 55 U/L (ref 38–126)
Anion gap: 7 (ref 5–15)
BUN: 19 mg/dL (ref 8–23)
CO2: 31 mmol/L (ref 22–32)
Calcium: 9.8 mg/dL (ref 8.9–10.3)
Chloride: 102 mmol/L (ref 98–111)
Creatinine: 1.46 mg/dL — ABNORMAL HIGH (ref 0.61–1.24)
GFR, Estimated: 50 mL/min — ABNORMAL LOW (ref >60)
Glucose, Bld: 104 mg/dL — ABNORMAL HIGH (ref 70–99)
Potassium: 4.5 mmol/L (ref 3.5–5.1)
Sodium: 140 mmol/L (ref 135–145)
Total Bilirubin: 0.9 mg/dL (ref 0.0–1.2)
Total Protein: 7.5 g/dL (ref 6.5–8.1)

## 2023-12-03 LAB — LACTATE DEHYDROGENASE: LDH: 129 U/L (ref 98–192)

## 2024-12-01 ENCOUNTER — Ambulatory Visit: Admitting: Hematology

## 2024-12-01 ENCOUNTER — Other Ambulatory Visit
